# Patient Record
Sex: Female | Born: 1950
Health system: Southern US, Community
[De-identification: ages and names within clinical notes are randomized; demographics above are authoritative.]

## PROBLEM LIST (undated history)

## (undated) DIAGNOSIS — Z8619 Personal history of other infectious and parasitic diseases: Secondary | ICD-10-CM

## (undated) DIAGNOSIS — J45909 Unspecified asthma, uncomplicated: Secondary | ICD-10-CM

## (undated) DIAGNOSIS — Z9109 Other allergy status, other than to drugs and biological substances: Secondary | ICD-10-CM

## (undated) DIAGNOSIS — R0789 Other chest pain: Secondary | ICD-10-CM

## (undated) DIAGNOSIS — M1990 Unspecified osteoarthritis, unspecified site: Secondary | ICD-10-CM

## (undated) DIAGNOSIS — R112 Nausea with vomiting, unspecified: Secondary | ICD-10-CM

## (undated) DIAGNOSIS — E7800 Pure hypercholesterolemia, unspecified: Secondary | ICD-10-CM

## (undated) DIAGNOSIS — Z9889 Other specified postprocedural states: Secondary | ICD-10-CM

## (undated) DIAGNOSIS — N6099 Unspecified benign mammary dysplasia of unspecified breast: Secondary | ICD-10-CM

## (undated) DIAGNOSIS — R609 Edema, unspecified: Secondary | ICD-10-CM

## (undated) DIAGNOSIS — N189 Chronic kidney disease, unspecified: Secondary | ICD-10-CM

## (undated) DIAGNOSIS — K219 Gastro-esophageal reflux disease without esophagitis: Secondary | ICD-10-CM

## (undated) DIAGNOSIS — R0600 Dyspnea, unspecified: Secondary | ICD-10-CM

## (undated) HISTORY — DX: Edema, unspecified: R60.9

## (undated) HISTORY — DX: Unspecified benign mammary dysplasia of unspecified breast: N60.99

## (undated) HISTORY — DX: Unspecified osteoarthritis, unspecified site: M19.90

## (undated) HISTORY — PX: ABDOMINAL HYSTERECTOMY: SHX81

## (undated) HISTORY — PX: FOOT SURGERY: SHX648

## (undated) HISTORY — PX: WISDOM TOOTH EXTRACTION: SHX21

## (undated) HISTORY — DX: Other chest pain: R07.89

## (undated) HISTORY — DX: Pure hypercholesterolemia, unspecified: E78.00

## (undated) HISTORY — PX: DILATION AND CURETTAGE OF UTERUS: SHX78

---

## 1989-12-17 HISTORY — PX: TUBAL LIGATION: SHX77

## 1990-10-17 HISTORY — PX: RIGHT OOPHORECTOMY: SHX2359

## 1999-06-14 ENCOUNTER — Encounter: Payer: Self-pay | Admitting: Gynecology

## 1999-06-14 ENCOUNTER — Ambulatory Visit (HOSPITAL_COMMUNITY): Admission: RE | Admit: 1999-06-14 | Discharge: 1999-06-14 | Payer: Self-pay | Admitting: Gynecology

## 1999-06-19 ENCOUNTER — Ambulatory Visit (HOSPITAL_COMMUNITY): Admission: RE | Admit: 1999-06-19 | Discharge: 1999-06-19 | Payer: Self-pay | Admitting: Gynecology

## 1999-07-13 ENCOUNTER — Other Ambulatory Visit: Admission: RE | Admit: 1999-07-13 | Discharge: 1999-07-13 | Payer: Self-pay | Admitting: Gynecology

## 1999-11-07 ENCOUNTER — Ambulatory Visit (HOSPITAL_COMMUNITY): Admission: RE | Admit: 1999-11-07 | Discharge: 1999-11-07 | Payer: Self-pay | Admitting: *Deleted

## 2000-04-10 ENCOUNTER — Encounter: Payer: Self-pay | Admitting: Gynecology

## 2000-04-10 ENCOUNTER — Encounter: Admission: RE | Admit: 2000-04-10 | Discharge: 2000-04-10 | Payer: Self-pay | Admitting: Gynecology

## 2000-11-04 ENCOUNTER — Other Ambulatory Visit: Admission: RE | Admit: 2000-11-04 | Discharge: 2000-11-04 | Payer: Self-pay | Admitting: *Deleted

## 2001-07-22 ENCOUNTER — Ambulatory Visit (HOSPITAL_COMMUNITY): Admission: RE | Admit: 2001-07-22 | Discharge: 2001-07-22 | Payer: Self-pay | Admitting: *Deleted

## 2001-07-22 ENCOUNTER — Encounter: Payer: Self-pay | Admitting: *Deleted

## 2001-11-04 ENCOUNTER — Other Ambulatory Visit: Admission: RE | Admit: 2001-11-04 | Discharge: 2001-11-04 | Payer: Self-pay | Admitting: *Deleted

## 2001-11-11 ENCOUNTER — Encounter: Admission: RE | Admit: 2001-11-11 | Discharge: 2001-11-11 | Payer: Self-pay | Admitting: *Deleted

## 2001-11-11 ENCOUNTER — Encounter: Payer: Self-pay | Admitting: *Deleted

## 2002-09-16 DIAGNOSIS — N6099 Unspecified benign mammary dysplasia of unspecified breast: Secondary | ICD-10-CM

## 2002-09-16 HISTORY — PX: BREAST BIOPSY: SHX20

## 2002-09-16 HISTORY — PX: BREAST SURGERY: SHX581

## 2002-09-16 HISTORY — DX: Unspecified benign mammary dysplasia of unspecified breast: N60.99

## 2002-09-23 ENCOUNTER — Encounter: Payer: Self-pay | Admitting: *Deleted

## 2002-09-23 ENCOUNTER — Ambulatory Visit (HOSPITAL_COMMUNITY): Admission: RE | Admit: 2002-09-23 | Discharge: 2002-09-23 | Payer: Self-pay | Admitting: *Deleted

## 2002-09-25 ENCOUNTER — Encounter: Admission: RE | Admit: 2002-09-25 | Discharge: 2002-09-25 | Payer: Self-pay | Admitting: *Deleted

## 2002-09-25 ENCOUNTER — Encounter (INDEPENDENT_AMBULATORY_CARE_PROVIDER_SITE_OTHER): Payer: Self-pay | Admitting: Specialist

## 2002-09-25 ENCOUNTER — Encounter: Payer: Self-pay | Admitting: *Deleted

## 2002-11-20 ENCOUNTER — Other Ambulatory Visit: Admission: RE | Admit: 2002-11-20 | Discharge: 2002-11-20 | Payer: Self-pay | Admitting: Obstetrics and Gynecology

## 2003-04-13 ENCOUNTER — Encounter: Payer: Self-pay | Admitting: General Surgery

## 2003-04-13 ENCOUNTER — Encounter: Admission: RE | Admit: 2003-04-13 | Discharge: 2003-04-13 | Payer: Self-pay | Admitting: General Surgery

## 2003-10-11 ENCOUNTER — Encounter: Admission: RE | Admit: 2003-10-11 | Discharge: 2003-10-11 | Payer: Self-pay | Admitting: General Surgery

## 2003-10-11 ENCOUNTER — Encounter: Payer: Self-pay | Admitting: General Surgery

## 2003-12-27 ENCOUNTER — Encounter: Admission: RE | Admit: 2003-12-27 | Discharge: 2003-12-27 | Payer: Self-pay | Admitting: Obstetrics and Gynecology

## 2004-01-20 ENCOUNTER — Other Ambulatory Visit: Admission: RE | Admit: 2004-01-20 | Discharge: 2004-01-20 | Payer: Self-pay | Admitting: Obstetrics and Gynecology

## 2004-10-11 ENCOUNTER — Encounter: Admission: RE | Admit: 2004-10-11 | Discharge: 2004-10-11 | Payer: Self-pay | Admitting: *Deleted

## 2005-02-21 ENCOUNTER — Other Ambulatory Visit: Admission: RE | Admit: 2005-02-21 | Discharge: 2005-02-21 | Payer: Self-pay | Admitting: Obstetrics and Gynecology

## 2005-10-12 ENCOUNTER — Ambulatory Visit (HOSPITAL_COMMUNITY): Admission: RE | Admit: 2005-10-12 | Discharge: 2005-10-12 | Payer: Self-pay | Admitting: General Surgery

## 2005-10-12 ENCOUNTER — Encounter: Admission: RE | Admit: 2005-10-12 | Discharge: 2005-10-12 | Payer: Self-pay | Admitting: General Surgery

## 2005-10-29 ENCOUNTER — Ambulatory Visit (HOSPITAL_COMMUNITY): Admission: RE | Admit: 2005-10-29 | Discharge: 2005-10-29 | Payer: Self-pay | Admitting: General Surgery

## 2005-11-06 ENCOUNTER — Ambulatory Visit (HOSPITAL_COMMUNITY): Admission: RE | Admit: 2005-11-06 | Discharge: 2005-11-06 | Payer: Self-pay | Admitting: General Surgery

## 2006-02-25 ENCOUNTER — Other Ambulatory Visit: Admission: RE | Admit: 2006-02-25 | Discharge: 2006-02-25 | Payer: Self-pay | Admitting: Obstetrics & Gynecology

## 2006-04-19 ENCOUNTER — Ambulatory Visit (HOSPITAL_COMMUNITY): Admission: RE | Admit: 2006-04-19 | Discharge: 2006-04-19 | Payer: Self-pay | Admitting: Gastroenterology

## 2006-10-15 ENCOUNTER — Encounter: Admission: RE | Admit: 2006-10-15 | Discharge: 2006-10-15 | Payer: Self-pay | Admitting: General Surgery

## 2007-03-04 ENCOUNTER — Other Ambulatory Visit: Admission: RE | Admit: 2007-03-04 | Discharge: 2007-03-04 | Payer: Self-pay | Admitting: Obstetrics and Gynecology

## 2007-03-17 ENCOUNTER — Encounter: Admission: RE | Admit: 2007-03-17 | Discharge: 2007-03-17 | Payer: Self-pay | Admitting: Obstetrics and Gynecology

## 2007-07-18 HISTORY — PX: HYSTEROSCOPY WITH D & C: SHX1775

## 2007-08-15 ENCOUNTER — Ambulatory Visit (HOSPITAL_BASED_OUTPATIENT_CLINIC_OR_DEPARTMENT_OTHER): Admission: RE | Admit: 2007-08-15 | Discharge: 2007-08-15 | Payer: Self-pay | Admitting: Obstetrics & Gynecology

## 2007-08-15 ENCOUNTER — Encounter: Payer: Self-pay | Admitting: Obstetrics & Gynecology

## 2007-10-17 ENCOUNTER — Ambulatory Visit (HOSPITAL_COMMUNITY): Admission: RE | Admit: 2007-10-17 | Discharge: 2007-10-17 | Payer: Self-pay | Admitting: Surgery

## 2007-10-17 ENCOUNTER — Encounter: Admission: RE | Admit: 2007-10-17 | Discharge: 2007-10-17 | Payer: Self-pay | Admitting: Surgery

## 2008-03-04 ENCOUNTER — Other Ambulatory Visit: Admission: RE | Admit: 2008-03-04 | Discharge: 2008-03-04 | Payer: Self-pay | Admitting: Obstetrics and Gynecology

## 2008-04-19 ENCOUNTER — Ambulatory Visit (HOSPITAL_COMMUNITY): Admission: RE | Admit: 2008-04-19 | Discharge: 2008-04-19 | Payer: Self-pay | Admitting: Surgery

## 2008-10-19 ENCOUNTER — Encounter: Admission: RE | Admit: 2008-10-19 | Discharge: 2008-10-19 | Payer: Self-pay | Admitting: Obstetrics & Gynecology

## 2008-11-02 ENCOUNTER — Encounter (INDEPENDENT_AMBULATORY_CARE_PROVIDER_SITE_OTHER): Payer: Self-pay | Admitting: Family Medicine

## 2008-11-02 ENCOUNTER — Ambulatory Visit (HOSPITAL_COMMUNITY): Admission: RE | Admit: 2008-11-02 | Discharge: 2008-11-02 | Payer: Self-pay | Admitting: Family Medicine

## 2009-04-05 ENCOUNTER — Other Ambulatory Visit: Admission: RE | Admit: 2009-04-05 | Discharge: 2009-04-05 | Payer: Self-pay | Admitting: Gynecology

## 2009-10-24 ENCOUNTER — Encounter: Admission: RE | Admit: 2009-10-24 | Discharge: 2009-10-24 | Payer: Self-pay | Admitting: Surgery

## 2010-06-14 ENCOUNTER — Ambulatory Visit (HOSPITAL_COMMUNITY): Admission: RE | Admit: 2010-06-14 | Discharge: 2010-06-14 | Payer: Self-pay | Admitting: Orthopedic Surgery

## 2010-10-17 LAB — HM DEXA SCAN

## 2010-10-25 ENCOUNTER — Ambulatory Visit (HOSPITAL_COMMUNITY): Admission: RE | Admit: 2010-10-25 | Discharge: 2010-10-25 | Payer: Self-pay | Admitting: Surgery

## 2010-10-25 ENCOUNTER — Encounter: Admission: RE | Admit: 2010-10-25 | Discharge: 2010-10-25 | Payer: Self-pay | Admitting: Surgery

## 2010-10-26 ENCOUNTER — Ambulatory Visit: Payer: Self-pay | Admitting: Cardiology

## 2010-10-27 ENCOUNTER — Encounter: Admission: RE | Admit: 2010-10-27 | Discharge: 2010-10-27 | Payer: Self-pay | Admitting: Obstetrics & Gynecology

## 2011-05-01 NOTE — Op Note (Signed)
NAMEMINTA, FAIR NO.:  [id number]   MEDICAL RECORD NO.:  [id number]          PATIENT TYPE:  AMB   LOCATION:  [facility]                         FACILITY:  [facility]   PHYSICIAN:  M. Leda Quail, MD  DATE OF BIRTH:  1951/05/12   DATE OF PROCEDURE:  08/15/2007  DATE OF DISCHARGE:                               OPERATIVE REPORT   PREOPERATIVE DIAGNOSES:  1. [age]-year-old gravida 4, para 2, married white female with      postmenopausal bleed.  2. Endometrial polyp.   POSTOPERATIVE DIAGNOSES:  1. [age]-year-old gravida 4, para 2, married white female with      postmenopausal bleed.  2. Endometrial polyp.   PROCEDURE:  1. Hysteroscopy with polyp resection.  2. Dilatation and curettage.   SURGEON:  M. Leda Quail, MD   ASSISTANT:  OR staff.   ANESTHESIA:  MAC.  Dr. Rica Mast oversaw the case and CRNA for the  procedure was Mr. Andrey Campanile.   FINDINGS:  Endometrial polyp.   SPECIMENS:  Polyp and curettings to Pathology.   ESTIMATED BLOOD LOSS:  Minimal.   FLUIDS:  900 mL of LR.   URINE OUTPUT:  100 mL of clear urine drained with a red rubber Foley  catheter at the beginning of the procedure.   COMPLICATIONS:  None.   INDICATIONS:  Ms. Sweigert is a very nice 60 year old G4, P2 married  white female who called in the end of July with the complaint of some  postmenopausal bleeding; she notices this only when wiping.  We decided  to go ahead and proceed with a sonohysterogram, which showed some small  intramural fibroids, but at least 1, possibly 2 endometrial polyps.  We  talked about removing this surgically and patient was in agreement with  this.  She is here for this today.   PROCEDURE:  The patient was taken to the operating room, consent present  on the chart and running IVs in place.  The patient was placed in supine  position.  Anesthesia was administered by the anesthesia staff without  difficulty.  Legs were positioned in the low lithotomy  position in [place]  stirrups.  Care was taken to make sure that there was no extra pressure  on the outside of the knees or the ankles.  Legs were then positioned in  the high lithotomy position.  Perineum, inner thighs and vagina were  prepped in the normal sterile fashion.  A red rubber Foley catheter was  used to drain the bladder of all urine.  The patient was then draped in  the normal sterile fashion.   A bivalve speculum was placed in the vagina.  The anterior lip of the  cervix was grasped with a single-tooth tenaculum.  Ten milliliters of 1%  lidocaine without epinephrine were instilled in the cervix at the 3, 6,  9 and 12 o'clock positions.  The uterus was then sounded to about 8 cm;  this was consistent with the length of the uterus on ultrasound.  Using  [facility] dilators, the cervix was dilated up to a #29.   An operative hysteroscope was  then passed through the cervical os.  Hysteroscopic media of LR was used.  The endometrial cavity expanded  easily.  The tubal ostia were noted bilaterally and photodocumentation  was made.  In the right fundal region of the endometrial cavity, there  was 1 very clear polyp and a possible polyp versus fluffy area of  endometrial tissue.  The hysteroscope was removed.  A polyp forceps was  placed in the cervix and the polyp was grasped and twisted at the base.  Then using a toothed curette, the endometrium was curetted till a gritty  texture was noted in all quadrants.  Then the hysteroscope was placed  back through the cervical os and the hysteroscopic media was used again  to expand the endometrial cavity.  There was a small area at the base of  the polyp that was not completely removed.  Then using the resectoscope  portion of the hysteroscope, this base was excised with 1 pass of the  hysteroscope.  At this point, all instruments were removed from the  cervix.  The tenaculum was removed from the anterior lip of the cervix.  There was a  small amount of bleeding that was noted at the tenaculum  sites and this was made hemostatic with silver nitrate sticks.  The  endometrial curettings and polyp were put together to be sent to  Pathology.  The bivalve speculum was removed from the vagina.  The  patient was positioned back in the supine position and her legs were  removed from the [place] stirrups.  The Betadine was cleansed from the  skin.  She was awakened from anesthesia without difficulty.  Sponge, lap  and instrument counts were correct x2.  There were no needles used on  the field.      Lum Keas, MD  Electronically Signed     MSM/MEDQ  D:  08/15/2007  T:  08/16/2007  Job:  [id number]

## 2011-05-04 NOTE — Op Note (Signed)
NAMESHARYL, PANCHAL NO.:  [id number]   MEDICAL RECORD NO.:  [id number]          PATIENT TYPE:  AMB   LOCATION:  ENDO                         FACILITY:  [facility]   PHYSICIAN:  Bernette Redbird, M.D.   DATE OF BIRTH:  [birth year]   DATE OF PROCEDURE:  04/19/2006  DATE OF DISCHARGE:                                 OPERATIVE REPORT   PROCEDURE:  Colonoscopy.   INDICATIONS:  Screening for colon cancer in a 60 year old female with a  family history of colon polyps in her father (unknown histology).   FINDINGS:  Normal exam to the terminal ileum.   PROCEDURE:  The nature, purpose, and risks of the procedure had been  reviewed with the patient through our open access program.  I also reviewed  the risks and purpose of the procedure with the patient at the bedside.  Sedation was fentanyl 50 mcg, Versed 4 mg and Phenergan 12.5 mg IV without  arrhythmias or desaturation.  The Olympus adjustable tension pediatric video  colonoscope was advanced to the terminal ileum without significant  difficulty, using just a little bit of external abdominal compression to  control looping.  The terminal ileum was entered for a very short distance.  The appendiceal orifice was also clearly identified.  Pullback was then  performed.  The quality of prep was very good and with a little bit of  irrigation, it is felt that all areas were well seen.   This was a normal examination.  No polyps, cancer, colitis, vascular  malformations or diverticulosis were noted and retroflexion of the rectum  was normal.  No biopsies were obtained.  The patient tolerated the procedure  well and there no apparent complications.   IMPRESSION:  Normal screening colonoscopy in a patient with a family history  of colon polyps of indeterminate histology (V76.51).   PLAN:  Repeat colonoscopy in five years after which subsequent exams might  be spaced out to longer intervals since the family history is  uncertain.           ______________________________  Bernette Redbird, M.D.     RB/MEDQ  D:  04/19/2006  T:  04/19/2006  Job:  [id number]   cc:   Caryn Bee L. Little, M.D.  Fax: [phone]   Edwena Felty. Romine, M.D.  Fax: [phone]

## 2011-05-15 ENCOUNTER — Other Ambulatory Visit: Payer: Self-pay | Admitting: Gastroenterology

## 2011-05-15 LAB — HM COLONOSCOPY: HM COLON: NORMAL

## 2011-09-28 LAB — CBC
HCT: 39.2
Hemoglobin: 13.7
MCHC: 35
MCV: 88
Platelets: 361
RBC: 4.46
RDW: 12.7
WBC: 6.3

## 2011-09-28 LAB — URINALYSIS, ROUTINE W REFLEX MICROSCOPIC
Bilirubin Urine: NEGATIVE
Glucose, UA: NEGATIVE
Hgb urine dipstick: NEGATIVE
Ketones, ur: NEGATIVE
Nitrite: NEGATIVE
Protein, ur: NEGATIVE
Specific Gravity, Urine: 1.02
Urobilinogen, UA: 0.2
pH: 6

## 2011-09-28 LAB — PREGNANCY, URINE: Preg Test, Ur: NEGATIVE

## 2011-10-16 ENCOUNTER — Other Ambulatory Visit: Payer: Self-pay | Admitting: Obstetrics & Gynecology

## 2011-10-16 DIAGNOSIS — Z1231 Encounter for screening mammogram for malignant neoplasm of breast: Secondary | ICD-10-CM

## 2011-10-22 ENCOUNTER — Other Ambulatory Visit: Payer: Self-pay | Admitting: Cardiology

## 2011-10-22 MED ORDER — ROSUVASTATIN CALCIUM 5 MG PO TABS: 5.0000 mg | ORAL_TABLET | Freq: Every day | ORAL | Status: DC

## 2011-11-05 ENCOUNTER — Ambulatory Visit (INDEPENDENT_AMBULATORY_CARE_PROVIDER_SITE_OTHER): Payer: 59 | Admitting: Cardiology

## 2011-11-05 ENCOUNTER — Encounter: Payer: Self-pay | Admitting: Cardiology

## 2011-11-05 VITALS — BP 134/88 | HR 74 | Ht 63.0 in | Wt 170.0 lb

## 2011-11-05 DIAGNOSIS — E7800 Pure hypercholesterolemia, unspecified: Secondary | ICD-10-CM

## 2011-11-05 LAB — HEPATIC FUNCTION PANEL
ALT: 31 U/L (ref 0–35)
AST: 21 U/L (ref 0–37)
Albumin: 4.3 g/dL (ref 3.5–5.2)
Alkaline Phosphatase: 72 U/L (ref 39–117)
Bilirubin, Direct: 0 mg/dL (ref 0.0–0.3)
Total Bilirubin: 0.7 mg/dL (ref 0.3–1.2)
Total Protein: 7.3 g/dL (ref 6.0–8.3)

## 2011-11-05 LAB — BASIC METABOLIC PANEL
BUN: 16 mg/dL (ref 6–23)
Calcium: 9.8 mg/dL (ref 8.4–10.5)
GFR: 62.06 mL/min (ref >60.00)
Glucose, Bld: 92 mg/dL (ref 70–99)
Potassium: 3.8 mEq/L (ref 3.5–5.1)

## 2011-11-05 MED ORDER — ROSUVASTATIN CALCIUM 5 MG PO TABS: 5.0000 mg | ORAL_TABLET | Freq: Every day | ORAL | Status: DC

## 2011-11-05 NOTE — Patient Instructions (Signed)
We will check fasting blood work today.   Continue your current medication.  I will see you again in 1 year.

## 2011-11-05 NOTE — Progress Notes (Signed)
   Diane Fox Date of Birth: 03-15-1951 Medical Record #[id number]  History of Present Illness: Seen today for yearly followup. She has a history of hypercholesterolemia that we have been managing with statin therapy. She has a history of previous nuclear stress test in November of 2010 which was normal. She is feeling very well followup today. She has no new complaints. She is tolerating her medications well.  Current Outpatient Prescriptions on File Prior to Visit  Medication Sig Dispense Refill  . Calcium Carb-Cholecalciferol (CALCIUM PLUS VITAMIN D3 PO) Take 2 tablets by mouth daily.        Marland Kitchen escitalopram (LEXAPRO) 10 MG tablet Take 10 mg by mouth daily.        . multivitamin (THERAGRAN) per tablet Take 1 tablet by mouth daily.        Marland Kitchen triamterene-hydrochlorothiazide (MAXZIDE) 75-50 MG per tablet Take 0.5 tablets by mouth as needed.        Marland Kitchen DISCONTD: rosuvastatin (CRESTOR) 5 MG tablet Take 1 tablet (5 mg total) by mouth at bedtime.  15 tablet  0    Allergies  Allergen Reactions  . Penicillins   . Sulfa Antibiotics     Past Medical History  Diagnosis Date  . Hypercholesterolemia   . Atypical chest pain   . Arthritis     Past Surgical History  Procedure Date  . Right oophorectomy     History  Smoking status  . Never Smoker   Smokeless tobacco  . Never Used    History  Alcohol Use: Not on file    Family History  Problem Relation Age of Onset  . Heart disease Father   . Heart attack Father     Review of Systems: As noted in history of present illness..  All other systems were reviewed and are negative.  Physical Exam: BP 134/88  Pulse 74  Ht 5\' 3"  (1.6 m)  Wt 170 lb (77.111 kg)  BMI 30.11 kg/m2 The patient is alert and oriented x 3.  The mood and affect are normal.  The skin is warm and dry.  Color is normal.  The HEENT exam reveals that the sclera are nonicteric.  The mucous membranes are moist.  The carotids are 2+ without bruits.  There is no  thyromegaly.  There is no JVD.  The lungs are clear.  The chest wall is non tender.  The heart exam reveals a regular rate with a normal S1 and S2.  There are no murmurs, gallops, or rubs.  The PMI is not displaced.   Abdominal exam reveals good bowel sounds.  There is no guarding or rebound.  There is no hepatosplenomegaly or tenderness.  There are no masses.  Exam of the legs reveal no clubbing, cyanosis, or edema.  The legs are without rashes.  The distal pulses are intact.  Cranial nerves II - XII are intact.  Motor and sensory functions are intact.  The gait is normal.  LABORATORY DATA: ECG today demonstrates normal sinus rhythm with a normal ECG.  Assessment / Plan:

## 2011-11-05 NOTE — Assessment & Plan Note (Signed)
Lipid panel today shows excellent control of her lipid levels. We will continue on her current medical therapy and followup again in one year.

## 2011-11-06 ENCOUNTER — Ambulatory Visit
Admission: RE | Admit: 2011-11-06 | Discharge: 2011-11-06 | Disposition: A | Discharge status: Home / Self Care | Payer: 59 | Source: Ambulatory Visit | Attending: Obstetrics & Gynecology | Admitting: Obstetrics & Gynecology

## 2011-11-06 DIAGNOSIS — Z1231 Encounter for screening mammogram for malignant neoplasm of breast: Secondary | ICD-10-CM

## 2011-11-15 NOTE — Progress Notes (Signed)
Addended by: Andrey Cota A on: 11/15/2011 04:33 PM   Modules accepted: Orders

## 2012-05-27 LAB — HM PAP SMEAR: HM Pap smear: NEGATIVE

## 2012-10-20 ENCOUNTER — Other Ambulatory Visit: Payer: Self-pay | Admitting: Obstetrics & Gynecology

## 2012-10-20 DIAGNOSIS — Z1231 Encounter for screening mammogram for malignant neoplasm of breast: Secondary | ICD-10-CM

## 2012-10-23 ENCOUNTER — Other Ambulatory Visit: Payer: Self-pay | Admitting: Obstetrics & Gynecology

## 2012-10-23 DIAGNOSIS — N6099 Unspecified benign mammary dysplasia of unspecified breast: Secondary | ICD-10-CM

## 2012-11-06 ENCOUNTER — Ambulatory Visit
Admission: RE | Admit: 2012-11-06 | Discharge: 2012-11-06 | Disposition: A | Discharge status: Home / Self Care | Payer: 59 | Source: Ambulatory Visit | Attending: Obstetrics & Gynecology | Admitting: Obstetrics & Gynecology

## 2012-11-06 DIAGNOSIS — Z1231 Encounter for screening mammogram for malignant neoplasm of breast: Secondary | ICD-10-CM

## 2012-11-10 ENCOUNTER — Other Ambulatory Visit (HOSPITAL_COMMUNITY): Payer: 59

## 2012-11-12 ENCOUNTER — Ambulatory Visit (HOSPITAL_COMMUNITY)
Admission: RE | Admit: 2012-11-12 | Discharge: 2012-11-12 | Disposition: A | Discharge status: Home / Self Care | Payer: 59 | Source: Ambulatory Visit | Attending: Obstetrics & Gynecology | Admitting: Obstetrics & Gynecology

## 2012-11-12 DIAGNOSIS — N6099 Unspecified benign mammary dysplasia of unspecified breast: Secondary | ICD-10-CM

## 2012-11-12 DIAGNOSIS — N6089 Other benign mammary dysplasias of unspecified breast: Secondary | ICD-10-CM | POA: Insufficient documentation

## 2012-11-12 MED ORDER — GADOBENATE DIMEGLUMINE 529 MG/ML IV SOLN
15.0000 mL | Freq: Once | INTRAVENOUS | Status: AC | PRN
  Administered 2012-11-12: 15 mL via INTRAVENOUS

## 2012-11-17 LAB — POCT I-STAT, CHEM 8
BUN: 20 mg/dL (ref 6–23)
Chloride: 102 mEq/L (ref 96–112)
Creatinine, Ser: 1.1 mg/dL (ref 0.50–1.10)
Sodium: 140 mEq/L (ref 135–145)
TCO2: 29 mmol/L (ref 0–100)

## 2012-11-19 ENCOUNTER — Other Ambulatory Visit: Payer: Self-pay

## 2012-11-19 MED ORDER — ROSUVASTATIN CALCIUM 5 MG PO TABS: 5.0000 mg | ORAL_TABLET | Freq: Every day | ORAL | Status: DC

## 2013-01-15 ENCOUNTER — Other Ambulatory Visit: Payer: Self-pay

## 2013-01-15 DIAGNOSIS — E7800 Pure hypercholesterolemia, unspecified: Secondary | ICD-10-CM

## 2013-01-23 ENCOUNTER — Ambulatory Visit (INDEPENDENT_AMBULATORY_CARE_PROVIDER_SITE_OTHER): Payer: 59 | Admitting: Emergency Medicine

## 2013-01-23 ENCOUNTER — Encounter: Payer: Self-pay | Admitting: Emergency Medicine

## 2013-01-23 VITALS — BP 122/72 | HR 64 | Temp 97.8°F | Ht 62.5 in | Wt 171.0 lb

## 2013-01-23 DIAGNOSIS — R053 Chronic cough: Secondary | ICD-10-CM | POA: Insufficient documentation

## 2013-01-23 DIAGNOSIS — R059 Cough, unspecified: Secondary | ICD-10-CM

## 2013-01-23 DIAGNOSIS — R05 Cough: Secondary | ICD-10-CM

## 2013-01-23 NOTE — Patient Instructions (Addendum)
Please restart your allegra every day until next visit Please start protonix 40mg  daily until next visit A prescription for Tessalon Perles will be called to your pharmacy Try to undertake voice rest as per our Cyclical Cough Protocol Follow with Dr Delton Coombes in 1 month

## 2013-01-23 NOTE — Progress Notes (Signed)
Subjective:    Patient ID: Diane Fox, female    DOB: Jan 20, 1951, 62 y.o.   MRN: [id number]  HPI 62 yo never smoker, hx allergic rhinitis and possible childhood asthma, hyperlipidemia, HTN. Referred for chronic cough. The cough seemed to start in April '13 after she was treated with Mobic. The cough has been paroxysmal, non-productive, not necessarily related to time of day. Does sometimes wake her from sleeping. Her allergies have been low-level. She was treated with allegra 3 months > no change. Denies GERD sx.  Triggered by laughing. No swallowing trouble, ? Carbonated drinks bother it. She can have some SOB and CP after an episode. Same exertional tolerance.    Review of Systems  Constitutional: Negative for fever and unexpected weight change.  HENT: Negative for ear pain, nosebleeds, congestion, sore throat, rhinorrhea, sneezing, trouble swallowing, dental problem, postnasal drip and sinus pressure.   Eyes: Negative for redness and itching.  Respiratory: Positive for cough and shortness of breath. Negative for chest tightness and wheezing.   Cardiovascular: Negative for palpitations and leg swelling.  Gastrointestinal: Negative for nausea and vomiting.  Genitourinary: Negative for dysuria.  Musculoskeletal: Negative for joint swelling.  Skin: Negative for rash.  Neurological: Negative for headaches.  Hematological: Does not bruise/bleed easily.  Psychiatric/Behavioral: Negative for dysphoric mood. The patient is not nervous/anxious.    Past Medical History  Diagnosis Date  . Hypercholesterolemia   . Atypical chest pain   . Arthritis   . Sinus trouble      Family History  Problem Relation Age of Onset  . Heart disease Father   . Heart attack Father   . Heart disease Mother      History   Social History  . Marital Status: Married    Spouse Name: N/A    Number of Children: 2  . Years of Education: N/A   Occupational History  . IT    Social History  Main Topics  . Smoking status: Never Smoker   . Smokeless tobacco: Never Used  . Alcohol Use: Yes     Comment: 1 glass of wine per week  . Drug Use: No  . Sexually Active: Not on file   Other Topics Concern  . Not on file   Social History Narrative  . No narrative on file     Allergies  Allergen Reactions  . Penicillins   . Sulfa Antibiotics      Outpatient Prescriptions Prior to Visit  Medication Sig Dispense Refill  . Calcium Carb-Cholecalciferol (CALCIUM PLUS VITAMIN D3 PO) Take 2 tablets by mouth daily.        Marland Kitchen escitalopram (LEXAPRO) 10 MG tablet Take 10 mg by mouth daily.        . multivitamin (THERAGRAN) per tablet Take 1 tablet by mouth daily.        . rosuvastatin (CRESTOR) 5 MG tablet Take 1 tablet (5 mg total) by mouth at bedtime.  90 tablet  0  . triamterene-hydrochlorothiazide (MAXZIDE) 75-50 MG per tablet Take 0.5 tablets by mouth as needed.        . [DISCONTINUED] Nutritional Supplements (ESTROVEN PO) Take 1 tablet by mouth daily.         Last reviewed on 01/23/2013  9:45 AM by Lazarus Salines, RN       Objective:   Physical Exam Filed Vitals:   01/23/13 0946  BP: 122/72  Pulse: 64  Temp: 97.8 F (36.6 C)   Gen: Pleasant, well-nourished, in no distress,  normal affect  ENT: No lesions,  mouth clear,  oropharynx clear, no postnasal drip  Neck: No JVD, no TMG, no carotid bruits  Lungs: No use of accessory muscles, no dullness to percussion, clear without rales or rhonchi  Cardiovascular: RRR, heart sounds normal, no murmur or gallops, no peripheral edema  Musculoskeletal: No deformities, no cyanosis or clubbing  Neuro: alert, non focal  Skin: Warm, no lesions or rashes      Assessment & Plan:

## 2013-01-23 NOTE — Assessment & Plan Note (Signed)
Etiology unclear. She has been treated for allergies alone without effect. She doesn't have heartburn, but we discussed the possible benefits of doing GERD therapy empirically.   Please restart your allegra every day until next visit Please start protonix 40mg  daily until next visit A prescription for Tessalon Perles will be called to your pharmacy Try to undertake voice rest as per our Cyclical Cough Protocol Follow with Dr Delton Coombes in 1 month

## 2013-01-26 ENCOUNTER — Telehealth: Payer: Self-pay | Admitting: Emergency Medicine

## 2013-01-26 NOTE — Telephone Encounter (Signed)
As RB is listed off this pm, will route to doc of the day.  Dr. Craige Cotta, pls advise if you can clarify this so rx can be sent to pharm.  Thank you.

## 2013-01-26 NOTE — Telephone Encounter (Signed)
Will defer decision to Dr. Delton Coombes.  Please have him address this in AM 01/27/13.

## 2013-01-26 NOTE — Telephone Encounter (Signed)
protonix nor tessalon pearls were called in . Please advise which dosage of benzonatate and directions you would like called in dr. Delton Coombes thanks  Allergies  Allergen Reactions  . Biaxin (Clarithromycin)   . Penicillins   . Sulfa Antibiotics   . Zostavax (Zoster Vaccine Live)

## 2013-01-26 NOTE — Telephone Encounter (Signed)
Per Mindy, Dr. Marthe Patch we wait until RB is back in hospital tomorrow to advise further.  Pt is ok with waiting until tomorrow.  Will route to RB to address and to triage as a reminder to page RB in am.

## 2013-01-27 MED ORDER — PANTOPRAZOLE SODIUM 40 MG PO TBEC: 40.0000 mg | DELAYED_RELEASE_TABLET | Freq: Every day | ORAL | Status: DC

## 2013-01-27 MED ORDER — BENZONATATE 200 MG PO CAPS: 200.0000 mg | ORAL_CAPSULE | ORAL | Status: DC | PRN

## 2013-01-27 NOTE — Telephone Encounter (Addendum)
RB called. Call in Tessalon 200 mg 1 4hrs prn x 1 refill.

## 2013-01-27 NOTE — Telephone Encounter (Signed)
I spoke with pt and is aware of RB recs. She voiced her understanding rx has been sent. Nothing further was needed.

## 2013-01-27 NOTE — Telephone Encounter (Signed)
RB paged and msg routed to him

## 2013-02-16 ENCOUNTER — Other Ambulatory Visit (INDEPENDENT_AMBULATORY_CARE_PROVIDER_SITE_OTHER): Payer: 59

## 2013-02-16 DIAGNOSIS — E7800 Pure hypercholesterolemia, unspecified: Secondary | ICD-10-CM

## 2013-02-16 LAB — HEPATIC FUNCTION PANEL
Bilirubin, Direct: 0 mg/dL (ref 0.0–0.3)
Total Bilirubin: 0.5 mg/dL (ref 0.3–1.2)

## 2013-02-16 LAB — BASIC METABOLIC PANEL
BUN: 21 mg/dL (ref 6–23)
CO2: 28 mEq/L (ref 19–32)
Chloride: 99 mEq/L (ref 96–112)
Glucose, Bld: 94 mg/dL (ref 70–99)
Potassium: 3.6 mEq/L (ref 3.5–5.1)

## 2013-02-16 LAB — LIPID PANEL
HDL: 54.8 mg/dL (ref >39.00)
LDL Cholesterol: 77 mg/dL (ref 0–99)
Total CHOL/HDL Ratio: 3
VLDL: 24.6 mg/dL (ref 0.0–40.0)

## 2013-02-19 ENCOUNTER — Encounter: Payer: Self-pay | Admitting: Cardiology

## 2013-02-19 ENCOUNTER — Ambulatory Visit (INDEPENDENT_AMBULATORY_CARE_PROVIDER_SITE_OTHER): Payer: 59 | Admitting: Cardiology

## 2013-02-19 VITALS — BP 124/70 | HR 66 | Ht 63.0 in | Wt 168.8 lb

## 2013-02-19 DIAGNOSIS — E7800 Pure hypercholesterolemia, unspecified: Secondary | ICD-10-CM

## 2013-02-19 MED ORDER — ROSUVASTATIN CALCIUM 5 MG PO TABS: 5.0000 mg | ORAL_TABLET | Freq: Every day | ORAL | Status: DC

## 2013-02-19 NOTE — Patient Instructions (Signed)
Continue your current therapy  I will see you in one year   

## 2013-02-19 NOTE — Progress Notes (Signed)
Diane Fox Date of Birth: 01/24/1951 Medical Record #[id number]  History of Present Illness: Seen today for yearly followup. She has a history of hypercholesterolemia and is on Crestor therapy. She has no complaints.  Current Outpatient Prescriptions on File Prior to Visit  Medication Sig Dispense Refill  . benzonatate (TESSALON) 200 MG capsule Take 1 capsule (200 mg total) by mouth every 4 (four) hours as needed for cough.  60 capsule  1  . Calcium Carb-Cholecalciferol (CALCIUM PLUS VITAMIN D3 PO) Take 2 tablets by mouth daily.        Marland Kitchen escitalopram (LEXAPRO) 10 MG tablet Take 10 mg by mouth daily.        . multivitamin (THERAGRAN) per tablet Take 1 tablet by mouth daily.        . Probiotic Product (PROBIOTIC DAILY PO) Take 1 tablet by mouth daily.      Marland Kitchen triamterene-hydrochlorothiazide (MAXZIDE) 75-50 MG per tablet Take 0.5 tablets by mouth as needed.         No current facility-administered medications on file prior to visit.    Allergies  Allergen Reactions  . Biaxin (Clarithromycin)   . Penicillins   . Sulfa Antibiotics   . Zostavax (Zoster Vaccine Live)     Past Medical History  Diagnosis Date  . Hypercholesterolemia   . Atypical chest pain   . Arthritis   . Sinus trouble     Past Surgical History  Procedure Laterality Date  . Right oophorectomy    . Tubal ligation  1991    History  Smoking status  . Never Smoker   Smokeless tobacco  . Never Used    History  Alcohol Use  . Yes    Comment: 1 glass of wine per week    Family History  Problem Relation Age of Onset  . Heart disease Father   . Heart attack Father   . Heart disease Mother     Review of Systems: As noted in history of present illness..  All other systems were reviewed and are negative.  Physical Exam: BP 124/70  Pulse 66  Ht 5\' 3"  (1.6 m)  Wt 168 lb 12.8 oz (76.567 kg)  BMI 29.91 kg/m2 The patient is alert and oriented x 3. The HEENT exam is normal. The carotids are 2+  without bruits.  There is no thyromegaly.  There is no JVD.  The lungs are clear.  The heart exam reveals a regular rate with a normal S1 and S2.  There are no murmurs, gallops, or rubs.  The PMI is not displaced.     Exam of the legs reveal no clubbing, cyanosis, or edema.  The legs are without rashes.  The distal pulses are intact.  Cranial nerves II - XII are intact.  Motor and sensory functions are intact.  The gait is normal.  LABORATORY DATA: ECG today demonstrates normal sinus rhythm with a normal ECG. Rate 66 bpm. Lab Results  Component Value Date   WBC 6.3 08/14/2007   HGB 15.0 11/12/2012   HCT 44.0 11/12/2012   PLT 361 08/14/2007   GLUCOSE 94 02/16/2013   CHOL 156 02/16/2013   TRIG 123.0 02/16/2013   HDL 54.80 02/16/2013   LDLCALC 77 02/16/2013   ALT 30 02/16/2013   AST 23 02/16/2013   NA 138 02/16/2013   K 3.6 02/16/2013   CL 99 02/16/2013   CREATININE 1.1 02/16/2013   BUN 21 02/16/2013   CO2 28 02/16/2013   Assessment / Plan:  1. Hypercholesterolemia. Well controlled on crestor. Continue Rx and follow up in one year.

## 2013-02-20 ENCOUNTER — Ambulatory Visit: Payer: 59 | Admitting: Emergency Medicine

## 2013-06-12 ENCOUNTER — Other Ambulatory Visit: Payer: Self-pay | Admitting: *Deleted

## 2013-06-12 MED ORDER — ESCITALOPRAM OXALATE 10 MG PO TABS: 10.0000 mg | ORAL_TABLET | Freq: Every day | ORAL | Status: DC

## 2013-06-12 NOTE — Telephone Encounter (Signed)
Can I see chart please?

## 2013-06-12 NOTE — Telephone Encounter (Signed)
Faxed refill request received from pharmacy for [facility]  Last filled by MD on 05/27/12, #90 X 3 Last AEX - 05/27/12 Next AEX - 06/23/13 with Shirlyn Goltz Please advise refills.

## 2013-06-22 ENCOUNTER — Encounter: Payer: Self-pay | Admitting: *Deleted

## 2013-06-23 ENCOUNTER — Encounter: Payer: Self-pay | Admitting: Nurse Practitioner

## 2013-06-23 ENCOUNTER — Ambulatory Visit (INDEPENDENT_AMBULATORY_CARE_PROVIDER_SITE_OTHER): Payer: 59 | Admitting: Nurse Practitioner

## 2013-06-23 VITALS — BP 122/60 | HR 64 | Resp 12 | Ht 63.0 in | Wt 153.8 lb

## 2013-06-23 DIAGNOSIS — Z0000 Encounter for general adult medical examination without abnormal findings: Secondary | ICD-10-CM

## 2013-06-23 DIAGNOSIS — Z01419 Encounter for gynecological examination (general) (routine) without abnormal findings: Secondary | ICD-10-CM

## 2013-06-23 LAB — POCT URINALYSIS DIPSTICK
Leukocytes, UA: NEGATIVE
Urobilinogen, UA: NEGATIVE

## 2013-06-23 MED ORDER — ESCITALOPRAM OXALATE 10 MG PO TABS: 10.0000 mg | ORAL_TABLET | Freq: Every day | ORAL | Status: DC

## 2013-06-23 NOTE — Patient Instructions (Signed)

## 2013-06-23 NOTE — Progress Notes (Signed)
62 y.o. [id number] Married Caucasian Fe here for annual exam.  Just returned from another cruise to [place].  No increase in depressive symptoms. No vaginal bleeding since 10/2010.  Patient's last menstrual period was 10/17/2010.          Sexually active: no  The current method of family planning is tubal ligation.    Exercising: yes  walk Smoker:  no  Health Maintenance: Pap:  05/27/2012  Normal with negative HR HPV MMG:  11/12/2012 normal Colonoscopy:  04/2011 1 polyp recheck in 10 years BMD:   10/2010 T Score: -0.5; -1.5  (Will be rechecking in November) TDaP:  11/2006 MMR II 12/2006 Shingles vaccine 2012 Labs: Hgb- 14.5    reports that she has never smoked. She has never used smokeless tobacco. She reports that she drinks about 0.5 ounces of alcohol per week. She reports that she does not use illicit drugs.  Past Medical History  Diagnosis Date  . Hypercholesterolemia   . Atypical chest pain   . Arthritis   . Sinus trouble   . Peri-menopausal   . Edema   . 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 Right oophorectomy Right 11/91    tubal ligation with complications  . Breast bx  09/2002    breast sterootactec biopsy  . Hysteroscopy      polyp removed-benign  . Polypectomy  07/2007    hysteroscopic with removal of polyp Dr. Hyacinth Meeker  . Endometrial biopsy  08/2010    proliferative endo  . Breast surgery  10/03    focal atypia hyperplasia  . Tubal ligation  1991  . Oophorectomy Right 10/1990    Current Outpatient Prescriptions  Medication Sig Dispense Refill  . Calcium Carb-Cholecalciferol (CALCIUM PLUS VITAMIN D3 PO) Take 2 tablets by mouth daily.        . cholecalciferol (VITAMIN D) 400 UNITS TABS Take by mouth daily.      Marland Kitchen escitalopram (LEXAPRO) 10 MG tablet Take 1 tablet (10 mg total) by mouth daily.  90 tablet  3  . fexofenadine (ALLEGRA) 180 MG tablet Take 180 mg by mouth daily.      . multivitamin  (THERAGRAN) per tablet Take 1 tablet by mouth daily.        . Probiotic Product (PROBIOTIC DAILY PO) Take 1 tablet by mouth daily.      . rosuvastatin (CRESTOR) 5 MG tablet Take 1 tablet (5 mg total) by mouth at bedtime.  90 tablet  3  . triamterene-hydrochlorothiazide (MAXZIDE) 75-50 MG per tablet Take 0.5 tablets by mouth as needed.         No current facility-administered medications for this visit.    Family History  Problem Relation Age of Onset  . Heart disease Father   . Heart attack Father   . Hypertension Father   . Heart disease Mother   . Hypertension Mother   . Asthma Brother   . Cancer Maternal Grandmother     lymphoma  . Cancer Maternal Grandfather     prostate cancer    ROS:  Pertinent items are noted in HPI.  Otherwise, a comprehensive ROS was negative.  Exam:   BP 122/60  Pulse 64  Resp 12  Ht 5\' 3"  (1.6 m)  Wt 153 lb 12.8 oz (69.763 kg)  BMI 27.25 kg/m2  LMP 10/17/2010 Height: 5\' 3"  (160 cm)  Ht Readings from Last 3 Encounters:  06/23/13 5\' 3"  (1.6 m)  02/19/13 5\' 3"  (1.6 m)  01/23/13 5' 2.5" (1.588 m)    General appearance: alert, cooperative and appears stated age Head: Normocephalic, without obvious abnormality, atraumatic Neck: no adenopathy, supple, symmetrical, trachea midline and thyroid normal to inspection and palpation Lungs: clear to auscultation bilaterally Breasts: normal appearance, no masses or tenderness Heart: regular rate and rhythm Abdomen: soft, non-tender; no masses,  no organomegaly Extremities: extremities normal, atraumatic, no cyanosis or edema Skin: Skin color, texture, turgor normal. No rashes or lesions Lymph nodes: Cervical, supraclavicular, and axillary nodes normal. No abnormal inguinal nodes palpated Neurologic: Grossly normal   Pelvic: External genitalia:  no lesions              Urethra:  normal appearing urethra with no masses, tenderness or lesions              Bartholin's and Skene's: normal                  Vagina: normal appearing vagina with normal color and discharge, no lesions              Cervix: anteverted              Pap taken: no Bimanual Exam:  Uterus:  normal size, contour, position, consistency, mobility, non-tender              Adnexa: no mass, fullness, tenderness               Rectovaginal: Confirms               Anus:  normal sphincter tone, no lesions  A:  Well Woman with normal exam  Postmenopausal no HRT  hypercholesterolemia  History of breast biopsy with focal atypical hyperplasia 10/03  Situational depression doing well on Lexapro  History of endo biopsy w/ proliferative endo and neg Provera challenge  P:   Pap smear as per guidelines   Mammogram due 11/14  counseled on adequate intake of calcium and vitamin D, diet and exercise,  Kegel's exercises return annually or prn  An After Visit Summary was printed and given to the patient.

## 2013-06-29 ENCOUNTER — Encounter: Payer: Self-pay | Admitting: Nurse Practitioner

## 2013-06-29 NOTE — Progress Notes (Signed)
Encounter reviewed by Dr. Excell Neyland Silva.  

## 2013-06-29 NOTE — Progress Notes (Signed)
Diane Fox,  Please see your HPI for this patient.  You placed a wild card in your comment about her bleeding so I cannot close.  If patient had recent bleeding, set up a pelvic ultrasound and endometrial biopsy.  Thank you,  Conley Simmonds

## 2013-06-29 NOTE — Progress Notes (Signed)
Tiffany had to veriify LMP before I would close this encounter.  It really was 2011.

## 2013-06-30 ENCOUNTER — Encounter: Payer: Self-pay | Admitting: Nurse Practitioner

## 2013-08-28 ENCOUNTER — Other Ambulatory Visit (HOSPITAL_COMMUNITY): Payer: Self-pay | Admitting: Orthopedic Surgery

## 2013-08-28 DIAGNOSIS — M25562 Pain in left knee: Secondary | ICD-10-CM

## 2013-09-07 ENCOUNTER — Ambulatory Visit (HOSPITAL_COMMUNITY)
Admission: RE | Admit: 2013-09-07 | Discharge: 2013-09-07 | Disposition: A | Discharge status: Home / Self Care | Payer: 59 | Source: Ambulatory Visit | Attending: Orthopedic Surgery | Admitting: Orthopedic Surgery

## 2013-09-07 DIAGNOSIS — X58XXXA Exposure to other specified factors, initial encounter: Secondary | ICD-10-CM | POA: Insufficient documentation

## 2013-09-07 DIAGNOSIS — M25469 Effusion, unspecified knee: Secondary | ICD-10-CM | POA: Insufficient documentation

## 2013-09-07 DIAGNOSIS — M6740 Ganglion, unspecified site: Secondary | ICD-10-CM | POA: Insufficient documentation

## 2013-09-07 DIAGNOSIS — M2390 Unspecified internal derangement of unspecified knee: Secondary | ICD-10-CM | POA: Insufficient documentation

## 2013-09-07 DIAGNOSIS — S83006A Unspecified dislocation of unspecified patella, initial encounter: Secondary | ICD-10-CM | POA: Insufficient documentation

## 2013-09-07 DIAGNOSIS — M25562 Pain in left knee: Secondary | ICD-10-CM

## 2013-10-09 ENCOUNTER — Other Ambulatory Visit: Payer: Self-pay

## 2013-10-09 DIAGNOSIS — Z1231 Encounter for screening mammogram for malignant neoplasm of breast: Secondary | ICD-10-CM

## 2013-10-22 ENCOUNTER — Other Ambulatory Visit: Payer: Self-pay

## 2013-10-28 ENCOUNTER — Encounter: Payer: Self-pay | Admitting: Nurse Practitioner

## 2013-11-05 ENCOUNTER — Other Ambulatory Visit: Payer: Self-pay | Admitting: Orthopedic Surgery

## 2013-11-05 NOTE — Progress Notes (Signed)
Preoperative surgical orders have been place into the Epic hospital system for Diane Fox on 11/05/2013, 10:55 PM  by Patrica Duel for surgery on 12/8.  Preop Knee  orders including IV Tylenol and IV Decadron as long as there are no contraindications to the above medications. Avel Peace, PA-C/14

## 2013-11-09 ENCOUNTER — Ambulatory Visit
Admission: RE | Admit: 2013-11-09 | Discharge: 2013-11-09 | Disposition: A | Discharge status: Home / Self Care | Payer: 59 | Source: Ambulatory Visit

## 2013-11-09 DIAGNOSIS — Z1231 Encounter for screening mammogram for malignant neoplasm of breast: Secondary | ICD-10-CM

## 2013-11-10 ENCOUNTER — Encounter (HOSPITAL_COMMUNITY): Payer: Self-pay | Admitting: Pharmacy Technician

## 2013-11-11 ENCOUNTER — Other Ambulatory Visit (HOSPITAL_COMMUNITY): Payer: Self-pay | Admitting: Orthopedic Surgery

## 2013-11-11 NOTE — Patient Instructions (Addendum)
20 Diane Fox  11/11/2013   Your procedure is scheduled on:  11/23/13  MONDAY  Report to [facility] at   1045    AM.  Call this number if you have problems the morning of surgery: [phone]       Remember:   Do not eat food After Midnight. Sunday NIGHT--- MAY HAVE CLEAR LIQUIDS Monday MORNING UNTIL 0745 am, THEN NOTHING BY MOUTH   Take these medicines the morning of surgery with A SIP OF WATER:  NONE   .  Contacts, dentures or partial plates can not be worn to surgery  Leave suitcase in the car. After surgery it may be brought to your room.  For patients admitted to the hospital, checkout time is 11:00 AM day of  discharge.             SPECIAL INSTRUCTIONS- SEE  PREPARING FOR SURGERY INSTRUCTION SHEET-     DO NOT WEAR JEWELRY, LOTIONS, POWDERS, OR PERFUMES.  WOMEN-- DO NOT SHAVE LEGS OR UNDERARMS FOR 12 HOURS BEFORE SHOWERS. MEN MAY SHAVE FACE.  Patients discharged the day of surgery will not be allowed to drive home. IF going home the day of surgery, you must have a driver and someone to stay with you for the first 24 hours  Name and phone number of your driver: ADMISSION                                                                       Please read over the following fact sheets that you were given: MRSA Information, Incentive Spirometry Sheet, Blood Transfusion Sheet  Information                                                                                   Ndrew Creason  PST [phone]  [phone]                 FAILURE TO FOLLOW THESE INSTRUCTIONS MAY RESULT IN  CANCELLATION   OF YOUR SURGERY                                                  Patient Signature _____________________________

## 2013-11-11 NOTE — Progress Notes (Signed)
LOV with EKG 3/14 Dr [place] EPIC,clearance with OV and EKG Dr Clarene Duke 09/29/13 chart

## 2013-11-16 ENCOUNTER — Encounter (HOSPITAL_COMMUNITY): Payer: Self-pay

## 2013-11-16 ENCOUNTER — Encounter (HOSPITAL_COMMUNITY)
Admission: RE | Admit: 2013-11-16 | Discharge: 2013-11-16 | Disposition: A | Discharge status: Home / Self Care | Payer: 59 | Source: Ambulatory Visit | Attending: Orthopedic Surgery | Admitting: Orthopedic Surgery

## 2013-11-16 DIAGNOSIS — Z01812 Encounter for preprocedural laboratory examination: Secondary | ICD-10-CM | POA: Insufficient documentation

## 2013-11-16 DIAGNOSIS — Z01818 Encounter for other preprocedural examination: Secondary | ICD-10-CM | POA: Insufficient documentation

## 2013-11-16 LAB — PROTIME-INR: INR: 0.94 (ref 0.00–1.49)

## 2013-11-16 LAB — SURGICAL PCR SCREEN
MRSA, PCR: NEGATIVE
Staphylococcus aureus: NEGATIVE

## 2013-11-16 LAB — COMPREHENSIVE METABOLIC PANEL
AST: 21 U/L (ref 0–37)
Albumin: 4 g/dL (ref 3.5–5.2)
Alkaline Phosphatase: 79 U/L (ref 39–117)
CO2: 30 mEq/L (ref 19–32)
Calcium: 9.5 mg/dL (ref 8.4–10.5)
Chloride: 102 mEq/L (ref 96–112)
GFR calc non Af Amer: 62 mL/min — ABNORMAL LOW (ref >90)
Potassium: 3.5 mEq/L (ref 3.5–5.1)
Total Bilirubin: 0.4 mg/dL (ref 0.3–1.2)

## 2013-11-16 LAB — URINALYSIS, ROUTINE W REFLEX MICROSCOPIC
Glucose, UA: NEGATIVE mg/dL
Leukocytes, UA: NEGATIVE
Nitrite: NEGATIVE
Protein, ur: NEGATIVE mg/dL
Specific Gravity, Urine: 1.015 (ref 1.005–1.030)
Urobilinogen, UA: 0.2 mg/dL (ref 0.0–1.0)

## 2013-11-16 LAB — CBC
HCT: 43.6 % (ref 36.0–46.0)
MCV: 89.3 fL (ref 78.0–100.0)
Platelets: 349 10*3/uL (ref 150–400)
RBC: 4.88 MIL/uL (ref 3.87–5.11)
RDW: 13.1 % (ref 11.5–15.5)
WBC: 6.5 10*3/uL (ref 4.0–10.5)

## 2013-11-16 LAB — APTT: aPTT: 31 seconds (ref 24–37)

## 2013-11-16 LAB — ABO/RH: ABO/RH(D): A POS

## 2013-11-16 LAB — TYPE AND SCREEN: Antibody Screen: NEGATIVE

## 2013-11-22 ENCOUNTER — Other Ambulatory Visit: Payer: Self-pay | Admitting: Orthopedic Surgery

## 2013-11-22 NOTE — H&P (Signed)
Diane Fox  DOB: 12/25/1950 Married / Language: English / Race: White Female  Date of Admission:  11-23-2013  Chief Complaint:  Left Knee Pain  History of Present Illness The patient is a 62 year old female who comes in for a preoperative History and Physical. The patient is scheduled for a left patellofemoral arhtroplasty to be performed by Dr. Gus Rankin. Aluisio, MD at [facility] on 11/23/2013. The patient is a 62 year old female who presents for follow up of their knee. The patient is being followed for their left knee pain and osteoarthritis. Symptoms reported today include: pain. The patient feels that they are doing poorly and report their pain level to be moderate. Current treatment includes: NSAIDs (Aleve or ibuprofen). The patient had the MRI scan performed to see if she potentially had a meniscal tear causing her problems. Pain remains real significant. It is definitely limiting what she can and cannot do. She has a hard time bending and squatting. She has a hard time going up and down stairs. Pain is anterior and lateral. She does not get swelling. It does feel like it wants to give out on her at times. She cannot do what she desires because of her knee pain. The right hurts also, but to a much lesser degree. She is ready to get the knee fixed. She presents now for the patellofemoral replacement. They have been treated conservatively in the past for the above stated problem and despite conservative measures, they continue to have progressive pain and severe functional limitations and dysfunction. They have failed non-operative management including home exercise, medications, and injections. It is felt that they would benefit from undergoing patellofemoral replacement. Risks and benefits of the procedure have been discussed with the patient and they elect to proceed with surgery. There are no active contraindications to surgery such as ongoing infection or  rapidly progressive neurological disease.     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 Atypical Lobular Hyperplasia   Allergies Sulfa Drugs. Hives. Penicillins. Hives.    Family History Cerebrovascular Accident. First Degree Relatives. mother Congestive Heart Failure. father Heart Disease. father Hypertension. mother and father Cancer. grandmother mothers side and grandfather mothers side    Social History Number of flights of stairs before winded. 1 Pain Contract. no Tobacco / smoke exposure. no Marital status. married Exercise. Exercises daily; does running / walking Illicit drug use. no Living situation. live with spouse Tobacco use. never smoker Drug/Alcohol Rehab (Previously). no Children. 2 Current work status. working full time Drug/Alcohol Rehab (Currently). no Alcohol use. current drinker; drinks beer, wine and hard liquor; only occasionally per week Post-Surgical Plans. Home Advance Directives. Living Will, Healthcare POA    Medication History Crestor (5MG  Tablet, Oral) Active. Lexapro (10MG  Tablet, Oral) Active. Maxzide (75-50MG  Tablet, Oral as needed) Active. Centrum Silver ( Oral) Active. D3-1000 (1000UNIT Tablet, Oral) Active. Probiotic Daily ( Oral) Active.   Past Surgical History Breast Biopsy. right Tubal Ligation. Date: 48. Wisdom Teeth Extraction. Date: 45. Hysteroscopy. Date: 2007. Colonoscopy    Review of Systems General:Present- Night Sweats. Not Present- Chills, Fever, Fatigue, Weight Gain, Weight Loss and Memory Loss. Skin:Not Present- Hives, Itching, Rash, Eczema and Lesions. HEENT:Not Present- Tinnitus, Headache, Double Vision, Visual Loss, Hearing Loss and Dentures. Respiratory:Present- Shortness of breath with exertion. Not  Present- Shortness of breath at rest, Allergies, Coughing up blood and Chronic Cough. Cardiovascular:Not Present- Chest Pain,  Racing/skipping heartbeats, Difficulty Breathing Lying Down, Murmur, Swelling and Palpitations. Gastrointestinal:Not Present- Bloody Stool, Heartburn, Abdominal Pain, Vomiting, Nausea, Constipation, Diarrhea, Difficulty Swallowing, Jaundice and Loss of appetitie. Female Genitourinary:Not Present- Blood in Urine, Urinary frequency, Weak urinary stream, Discharge, Flank Pain, Incontinence, Painful Urination, Urgency, Urinary Retention and Urinating at Night. Musculoskeletal:Present- Joint Pain. Not Present- Muscle Weakness, Muscle Pain, Joint Swelling, Back Pain, Morning Stiffness and Spasms. Neurological:Not Present- Tremor, Dizziness, Blackout spells, Paralysis, Difficulty with balance and Weakness. Psychiatric:Not Present- Insomnia.    Vitals Weight: 160 lb Height: 63 in Weight was reported by patient. Height was reported by patient. Body Surface Area: 1.8 m Body Mass Index: 28.34 kg/m Pulse: 64 (Regular) Resp.: 14 (Unlabored) BP: 128/67 (Sitting, Right Arm, Standard)     Physical Exam The physical exam findings are as follows:   General Mental Status - Alert, cooperative and good historian. General Appearance- pleasant. Not in acute distress. Orientation- Oriented X3. Build & Nutrition- Well nourished and Well developed.   Head and Neck Head- normocephalic, atraumatic . Neck Global Assessment- supple. no bruit auscultated on the right and no bruit auscultated on the left.   Eye Vision- Wears corrective lenses. Pupil- Bilateral- Regular and Round. Motion- Bilateral- EOMI.   Chest and Lung Exam Auscultation: Breath sounds:- clear at anterior chest wall and - clear at posterior chest wall. Adventitious sounds:- No Adventitious sounds.   Cardiovascular Auscultation:Rhythm- Regular rate and rhythm. Heart  Sounds- S1 WNL and S2 WNL. Murmurs & Other Heart Sounds:Auscultation of the heart reveals - No Murmurs.   Abdomen Palpation/Percussion:Tenderness- Abdomen is non-tender to palpation. Rigidity (guarding)- Abdomen is soft. Auscultation:Auscultation of the abdomen reveals - Bowel sounds normal.   Female Genitourinary Not done, not pertinent to present illness  Musculoskeletal On exam she is alert and oriented in no apparent distress. Her left knee shows no effusion. Range 5 to 125. There is marked crepitus on range of motion. She is slightly tender along the lateral retinaculum. No medial tenderness or instability.  The right knee no effusion. Range 0 to 130. Slight crepitus on range of motion. Some tenderness lateral greater than medial with no instability.  RADIOGRAPHS: MRI scan of her left knee. I do not see any meniscal tears. She has horrible patellofemoral arthritis with large spur formation and large subchondral cyst in the patella and trochlea.   Assessment & Plan Primary osteoarthritis of both knees (715.16) Impression: Left and Right  Note: Plan is for a Left Patellofemoral Replacement by Dr. Lequita Halt.  Plan is to go home  PCP - Dr. Catha Gosselin - Patient has been seen preoperatively and felt to be stable for surgery.  The patient does not have any contraindications and will receive TXA (tranexamic acid) prior to surgery.  Signed electronically by Lauraine Rinne, III PA-C

## 2013-11-23 ENCOUNTER — Encounter (HOSPITAL_COMMUNITY)
Admission: RE | Disposition: A | Discharge status: Home Health | Payer: Self-pay | Source: Ambulatory Visit | Attending: Orthopedic Surgery

## 2013-11-23 ENCOUNTER — Encounter (HOSPITAL_COMMUNITY): Payer: Self-pay | Admitting: *Deleted

## 2013-11-23 ENCOUNTER — Inpatient Hospital Stay (HOSPITAL_COMMUNITY): Payer: 59 | Admitting: Anesthesiology

## 2013-11-23 ENCOUNTER — Encounter (HOSPITAL_COMMUNITY): Payer: 59 | Admitting: Anesthesiology

## 2013-11-23 ENCOUNTER — Inpatient Hospital Stay (HOSPITAL_COMMUNITY)
Admission: RE | Admit: 2013-11-23 | Discharge: 2013-11-24 | DRG: 470 | Disposition: A | Discharge status: Home Health | Payer: 59 | Source: Ambulatory Visit | Attending: Orthopedic Surgery | Admitting: Orthopedic Surgery

## 2013-11-23 DIAGNOSIS — Z79899 Other long term (current) drug therapy: Secondary | ICD-10-CM

## 2013-11-23 DIAGNOSIS — E7800 Pure hypercholesterolemia, unspecified: Secondary | ICD-10-CM | POA: Diagnosis present

## 2013-11-23 DIAGNOSIS — Z8249 Family history of ischemic heart disease and other diseases of the circulatory system: Secondary | ICD-10-CM

## 2013-11-23 DIAGNOSIS — J45909 Unspecified asthma, uncomplicated: Secondary | ICD-10-CM | POA: Diagnosis present

## 2013-11-23 DIAGNOSIS — N62 Hypertrophy of breast: Secondary | ICD-10-CM | POA: Diagnosis present

## 2013-11-23 DIAGNOSIS — M1710 Unilateral primary osteoarthritis, unspecified knee: Principal | ICD-10-CM | POA: Diagnosis present

## 2013-11-23 DIAGNOSIS — Z823 Family history of stroke: Secondary | ICD-10-CM

## 2013-11-23 HISTORY — PX: PATELLA-FEMORAL ARTHROPLASTY: SHX5037

## 2013-11-23 SURGERY — ARTHROPLASTY, PATELLOFEMORAL
Anesthesia: Spinal | Site: Knee | Laterality: Left

## 2013-11-23 MED ORDER — SODIUM CHLORIDE 0.9 % IR SOLN
Status: DC | PRN
  Administered 2013-11-23: 1000 mL

## 2013-11-23 MED ORDER — KETAMINE HCL 50 MG/ML IJ SOLN
INTRAMUSCULAR | Status: DC | PRN
  Administered 2013-11-23: 10 mg via INTRAMUSCULAR
  Administered 2013-11-23: 20 mg via INTRAMUSCULAR

## 2013-11-23 MED ORDER — SODIUM CHLORIDE 0.9 % IJ SOLN
INTRAMUSCULAR | Status: DC | PRN
  Administered 2013-11-23: 30 mL

## 2013-11-23 MED ORDER — METOCLOPRAMIDE HCL 10 MG PO TABS: 5.0000 mg | ORAL_TABLET | Freq: Three times a day (TID) | ORAL | Status: DC | PRN

## 2013-11-23 MED ORDER — ACETAMINOPHEN 500 MG PO TABS
1000.0000 mg | ORAL_TABLET | Freq: Once | ORAL | Status: AC
  Administered 2013-11-23: 1000 mg via ORAL
  Filled 2013-11-23: qty 2

## 2013-11-23 MED ORDER — LACTATED RINGERS IV SOLN: INTRAVENOUS | Status: DC

## 2013-11-23 MED ORDER — PROPOFOL 10 MG/ML IV BOLUS
INTRAVENOUS | Status: AC
  Filled 2013-11-23: qty 20

## 2013-11-23 MED ORDER — VANCOMYCIN HCL IN DEXTROSE 1-5 GM/200ML-% IV SOLN
1000.0000 mg | INTRAVENOUS | Status: AC
  Administered 2013-11-23: 1000 mg via INTRAVENOUS

## 2013-11-23 MED ORDER — LACTATED RINGERS IV SOLN
INTRAVENOUS | Status: DC
Start: 2013-11-23 — End: 2013-11-23
  Administered 2013-11-23: 1000 mL via INTRAVENOUS

## 2013-11-23 MED ORDER — VANCOMYCIN HCL IN DEXTROSE 1-5 GM/200ML-% IV SOLN
INTRAVENOUS | Status: AC
  Filled 2013-11-23: qty 200

## 2013-11-23 MED ORDER — ACETAMINOPHEN 650 MG RE SUPP: 650.0000 mg | Freq: Four times a day (QID) | RECTAL | Status: DC | PRN

## 2013-11-23 MED ORDER — PROPOFOL INFUSION 10 MG/ML OPTIME
INTRAVENOUS | Status: DC | PRN
  Administered 2013-11-23: 60 ug/kg/min via INTRAVENOUS

## 2013-11-23 MED ORDER — DEXAMETHASONE 6 MG PO TABS
10.0000 mg | ORAL_TABLET | Freq: Every day | ORAL | Status: AC
  Administered 2013-11-24: 10 mg via ORAL
  Filled 2013-11-23: qty 1

## 2013-11-23 MED ORDER — ESCITALOPRAM OXALATE 10 MG PO TABS
10.0000 mg | ORAL_TABLET | Freq: Every day | ORAL | Status: DC
  Administered 2013-11-23: 10 mg via ORAL
  Filled 2013-11-23 (×2): qty 1

## 2013-11-23 MED ORDER — METHOCARBAMOL 500 MG PO TABS
500.0000 mg | ORAL_TABLET | Freq: Four times a day (QID) | ORAL | Status: DC | PRN
  Administered 2013-11-23 – 2013-11-24 (×3): 500 mg via ORAL
  Filled 2013-11-23 (×3): qty 1

## 2013-11-23 MED ORDER — STERILE WATER FOR IRRIGATION IR SOLN
Status: DC | PRN
  Administered 2013-11-23: 3000 mL

## 2013-11-23 MED ORDER — FENTANYL CITRATE 0.05 MG/ML IJ SOLN
INTRAMUSCULAR | Status: DC | PRN
  Administered 2013-11-23: 50 ug via INTRAVENOUS

## 2013-11-23 MED ORDER — ONDANSETRON HCL 4 MG PO TABS: 4.0000 mg | ORAL_TABLET | Freq: Four times a day (QID) | ORAL | Status: DC | PRN

## 2013-11-23 MED ORDER — SODIUM CHLORIDE 0.9 % IJ SOLN
INTRAMUSCULAR | Status: AC
  Filled 2013-11-23: qty 50

## 2013-11-23 MED ORDER — DOCUSATE SODIUM 100 MG PO CAPS
100.0000 mg | ORAL_CAPSULE | Freq: Two times a day (BID) | ORAL | Status: DC
  Administered 2013-11-23 – 2013-11-24 (×2): 100 mg via ORAL

## 2013-11-23 MED ORDER — BUPIVACAINE LIPOSOME 1.3 % IJ SUSP
20.0000 mL | Freq: Once | INTRAMUSCULAR | Status: DC
  Filled 2013-11-23: qty 20

## 2013-11-23 MED ORDER — DEXAMETHASONE SODIUM PHOSPHATE 10 MG/ML IJ SOLN: 10.0000 mg | Freq: Once | INTRAMUSCULAR | Status: DC

## 2013-11-23 MED ORDER — TRAMADOL HCL 50 MG PO TABS: 50.0000 mg | ORAL_TABLET | Freq: Four times a day (QID) | ORAL | Status: DC | PRN

## 2013-11-23 MED ORDER — POLYETHYLENE GLYCOL 3350 17 G PO PACK: 17.0000 g | PACK | Freq: Every day | ORAL | Status: DC | PRN

## 2013-11-23 MED ORDER — METHOCARBAMOL 100 MG/ML IJ SOLN
500.0000 mg | Freq: Four times a day (QID) | INTRAVENOUS | Status: DC | PRN
  Filled 2013-11-23: qty 5

## 2013-11-23 MED ORDER — PHENOL 1.4 % MT LIQD
1.0000 | OROMUCOSAL | Status: DC | PRN
  Filled 2013-11-23: qty 177

## 2013-11-23 MED ORDER — BUPIVACAINE LIPOSOME 1.3 % IJ SUSP
INTRAMUSCULAR | Status: DC | PRN
  Administered 2013-11-23: 20 mL

## 2013-11-23 MED ORDER — TRANEXAMIC ACID 100 MG/ML IV SOLN
1000.0000 mg | INTRAVENOUS | Status: AC
  Administered 2013-11-23: 1000 mg via INTRAVENOUS
  Filled 2013-11-23: qty 10

## 2013-11-23 MED ORDER — MIDAZOLAM HCL 5 MG/5ML IJ SOLN
INTRAMUSCULAR | Status: DC | PRN
  Administered 2013-11-23: 2 mg via INTRAVENOUS

## 2013-11-23 MED ORDER — MENTHOL 3 MG MT LOZG
1.0000 | LOZENGE | OROMUCOSAL | Status: DC | PRN
  Filled 2013-11-23: qty 9

## 2013-11-23 MED ORDER — BUPIVACAINE HCL 0.25 % IJ SOLN
INTRAMUSCULAR | Status: DC | PRN
  Administered 2013-11-23: 20 mL

## 2013-11-23 MED ORDER — DIPHENHYDRAMINE HCL 12.5 MG/5ML PO ELIX: 12.5000 mg | ORAL_SOLUTION | ORAL | Status: DC | PRN

## 2013-11-23 MED ORDER — BISACODYL 10 MG RE SUPP: 10.0000 mg | Freq: Every day | RECTAL | Status: DC | PRN

## 2013-11-23 MED ORDER — LORATADINE 10 MG PO TABS
10.0000 mg | ORAL_TABLET | Freq: Every day | ORAL | Status: DC
  Filled 2013-11-23 (×2): qty 1

## 2013-11-23 MED ORDER — RIVAROXABAN 10 MG PO TABS
10.0000 mg | ORAL_TABLET | Freq: Every day | ORAL | Status: DC
  Administered 2013-11-24: 10 mg via ORAL
  Filled 2013-11-23 (×2): qty 1

## 2013-11-23 MED ORDER — MEPERIDINE HCL 50 MG/ML IJ SOLN: 6.2500 mg | INTRAMUSCULAR | Status: DC | PRN

## 2013-11-23 MED ORDER — ACETAMINOPHEN 500 MG PO TABS
1000.0000 mg | ORAL_TABLET | Freq: Four times a day (QID) | ORAL | Status: AC
  Administered 2013-11-23 – 2013-11-24 (×4): 1000 mg via ORAL
  Filled 2013-11-23 (×5): qty 2

## 2013-11-23 MED ORDER — BUPIVACAINE IN DEXTROSE 0.75-8.25 % IT SOLN
INTRATHECAL | Status: DC | PRN
  Administered 2013-11-23: 15 mg via INTRATHECAL

## 2013-11-23 MED ORDER — FENTANYL CITRATE 0.05 MG/ML IJ SOLN
INTRAMUSCULAR | Status: AC
  Filled 2013-11-23: qty 2

## 2013-11-23 MED ORDER — TRIAMTERENE-HCTZ 75-50 MG PO TABS
0.5000 | ORAL_TABLET | Freq: Every day | ORAL | Status: DC
  Filled 2013-11-23 (×3): qty 1

## 2013-11-23 MED ORDER — DEXAMETHASONE SODIUM PHOSPHATE 10 MG/ML IJ SOLN
INTRAMUSCULAR | Status: AC
  Filled 2013-11-23: qty 1

## 2013-11-23 MED ORDER — KETOROLAC TROMETHAMINE 15 MG/ML IJ SOLN: 7.5000 mg | Freq: Four times a day (QID) | INTRAMUSCULAR | Status: AC | PRN

## 2013-11-23 MED ORDER — ONDANSETRON HCL 4 MG/2ML IJ SOLN: 4.0000 mg | Freq: Four times a day (QID) | INTRAMUSCULAR | Status: DC | PRN

## 2013-11-23 MED ORDER — FLEET ENEMA 7-19 GM/118ML RE ENEM: 1.0000 | ENEMA | Freq: Once | RECTAL | Status: AC | PRN

## 2013-11-23 MED ORDER — SODIUM CHLORIDE 0.9 % IV SOLN: INTRAVENOUS | Status: DC

## 2013-11-23 MED ORDER — PROMETHAZINE HCL 25 MG/ML IJ SOLN: 6.2500 mg | INTRAMUSCULAR | Status: DC | PRN

## 2013-11-23 MED ORDER — METOCLOPRAMIDE HCL 5 MG/ML IJ SOLN: 5.0000 mg | Freq: Three times a day (TID) | INTRAMUSCULAR | Status: DC | PRN

## 2013-11-23 MED ORDER — OXYCODONE HCL 5 MG PO TABS
5.0000 mg | ORAL_TABLET | ORAL | Status: DC | PRN
  Administered 2013-11-23 (×2): 5 mg via ORAL
  Administered 2013-11-23 – 2013-11-24 (×7): 10 mg via ORAL
  Filled 2013-11-23: qty 2
  Filled 2013-11-23: qty 1
  Filled 2013-11-23 (×2): qty 2
  Filled 2013-11-23: qty 1
  Filled 2013-11-23 (×4): qty 2

## 2013-11-23 MED ORDER — VANCOMYCIN HCL IN DEXTROSE 1-5 GM/200ML-% IV SOLN
1000.0000 mg | Freq: Two times a day (BID) | INTRAVENOUS | Status: AC
  Administered 2013-11-24: 1000 mg via INTRAVENOUS
  Filled 2013-11-23: qty 200

## 2013-11-23 MED ORDER — DEXAMETHASONE SODIUM PHOSPHATE 10 MG/ML IJ SOLN
10.0000 mg | Freq: Every day | INTRAMUSCULAR | Status: AC
  Filled 2013-11-23: qty 1

## 2013-11-23 MED ORDER — LACTATED RINGERS IV SOLN
INTRAVENOUS | Status: DC | PRN
  Administered 2013-11-23 (×2): via INTRAVENOUS

## 2013-11-23 MED ORDER — HYDROMORPHONE HCL PF 1 MG/ML IJ SOLN: 0.2500 mg | INTRAMUSCULAR | Status: DC | PRN

## 2013-11-23 MED ORDER — KCL IN DEXTROSE-NACL 20-5-0.45 MEQ/L-%-% IV SOLN
INTRAVENOUS | Status: DC
  Administered 2013-11-23: 19:00:00 via INTRAVENOUS
  Filled 2013-11-23 (×3): qty 1000

## 2013-11-23 MED ORDER — ACETAMINOPHEN 325 MG PO TABS: 650.0000 mg | ORAL_TABLET | Freq: Four times a day (QID) | ORAL | Status: DC | PRN

## 2013-11-23 MED ORDER — BUPIVACAINE HCL (PF) 0.25 % IJ SOLN
INTRAMUSCULAR | Status: AC
  Filled 2013-11-23: qty 30

## 2013-11-23 MED ORDER — MIDAZOLAM HCL 2 MG/2ML IJ SOLN
INTRAMUSCULAR | Status: AC
  Filled 2013-11-23: qty 2

## 2013-11-23 MED ORDER — MORPHINE SULFATE 2 MG/ML IJ SOLN: 1.0000 mg | INTRAMUSCULAR | Status: DC | PRN

## 2013-11-23 MED ORDER — 0.9 % SODIUM CHLORIDE (POUR BTL) OPTIME
TOPICAL | Status: DC | PRN
  Administered 2013-11-23: 1000 mL

## 2013-11-23 SURGICAL SUPPLY — 51 items
BAG ZIPLOCK 12X15 (MISCELLANEOUS) ×2 IMPLANT
BANDAGE ELASTIC 6 VELCRO ST LF (GAUZE/BANDAGES/DRESSINGS) ×2 IMPLANT
BANDAGE ESMARK 6X9 LF (GAUZE/BANDAGES/DRESSINGS) ×1 IMPLANT
BLADE SAG 18X100X1.27 (BLADE) ×2 IMPLANT
BNDG ESMARK 6X9 LF (GAUZE/BANDAGES/DRESSINGS) ×2
BOWL SMART MIX CTS (DISPOSABLE) IMPLANT
BUR OVAL CARBIDE 4.0 (BURR) ×2 IMPLANT
CEM TROCH PFJ SIGMA SZ1 LT HIP (Cement) ×2 IMPLANT
CEMENT HV SMART SET (Cement) ×2 IMPLANT
CUFF TOURN SGL QUICK 34 (TOURNIQUET CUFF) ×1
CUFF TRNQT CYL 34X4X40X1 (TOURNIQUET CUFF) ×1 IMPLANT
DECANTER SPIKE VIAL GLASS SM (MISCELLANEOUS) ×2 IMPLANT
DRAPE EXTREMITY T 121X128X90 (DRAPE) ×2 IMPLANT
DRAPE POUCH INSTRU U-SHP 10X18 (DRAPES) ×2 IMPLANT
DRAPE U-SHAPE 47X51 STRL (DRAPES) ×2 IMPLANT
DRSG ADAPTIC 3X8 NADH LF (GAUZE/BANDAGES/DRESSINGS) ×2 IMPLANT
DRSG PAD ABDOMINAL 8X10 ST (GAUZE/BANDAGES/DRESSINGS) ×2 IMPLANT
DURAPREP 26ML APPLICATOR (WOUND CARE) ×2 IMPLANT
ELECT REM PT RETURN 9FT ADLT (ELECTROSURGICAL) ×2
ELECTRODE REM PT RTRN 9FT ADLT (ELECTROSURGICAL) ×1 IMPLANT
EVACUATOR 1/8 PVC DRAIN (DRAIN) ×2 IMPLANT
FACESHIELD LNG OPTICON STERILE (SAFETY) ×10 IMPLANT
GLOVE BIO SURGEON STRL SZ7.5 (GLOVE) ×2 IMPLANT
GLOVE BIO SURGEON STRL SZ8 (GLOVE) ×2 IMPLANT
GLOVE BIOGEL PI IND STRL 8 (GLOVE) ×2 IMPLANT
GLOVE BIOGEL PI INDICATOR 8 (GLOVE) ×2
GOWN PREVENTION PLUS LG XLONG (DISPOSABLE) ×2 IMPLANT
GOWN STRL REIN XL XLG (GOWN DISPOSABLE) ×2 IMPLANT
HANDPIECE INTERPULSE COAX TIP (DISPOSABLE) ×1
KIT BASIN OR (CUSTOM PROCEDURE TRAY) ×2 IMPLANT
MANIFOLD NEPTUNE II (INSTRUMENTS) ×2 IMPLANT
NDL SAFETY ECLIPSE 18X1.5 (NEEDLE) ×2 IMPLANT
NEEDLE HYPO 18GX1.5 SHARP (NEEDLE) ×2
NS IRRIG 1000ML POUR BTL (IV SOLUTION) ×2 IMPLANT
PACK TOTAL JOINT (CUSTOM PROCEDURE TRAY) ×2 IMPLANT
PAD ABD 8X10 STRL (GAUZE/BANDAGES/DRESSINGS) ×2 IMPLANT
PADDING CAST COTTON 6X4 STRL (CAST SUPPLIES) ×4 IMPLANT
PATELLA DOME PFC 32MM (Knees) ×2 IMPLANT
POSITIONER SURGICAL ARM (MISCELLANEOUS) ×2 IMPLANT
SET HNDPC FAN SPRY TIP SCT (DISPOSABLE) ×1 IMPLANT
SPONGE GAUZE 4X4 12PLY (GAUZE/BANDAGES/DRESSINGS) ×2 IMPLANT
STRIP CLOSURE SKIN 1/2X4 (GAUZE/BANDAGES/DRESSINGS) ×2 IMPLANT
SUCTION FRAZIER 12FR DISP (SUCTIONS) ×2 IMPLANT
SUT MNCRL AB 4-0 PS2 18 (SUTURE) ×2 IMPLANT
SUT VIC AB 2-0 CT1 27 (SUTURE) ×3
SUT VIC AB 2-0 CT1 TAPERPNT 27 (SUTURE) ×3 IMPLANT
SUT VLOC 180 0 24IN GS25 (SUTURE) ×2 IMPLANT
SYR 20CC LL (SYRINGE) ×2 IMPLANT
SYR 50ML LL SCALE MARK (SYRINGE) ×2 IMPLANT
TOWEL OR 17X26 10 PK STRL BLUE (TOWEL DISPOSABLE) ×2 IMPLANT
TRAY FOLEY CATH 14FRSI W/METER (CATHETERS) ×2 IMPLANT

## 2013-11-23 NOTE — Interval H&P Note (Signed)
History and Physical Interval Note:  11/23/2013 1:11 PM  Diane Fox  has presented today for surgery, with the diagnosis of LEFT KNEE PATELLA FEMORAL OSTEOARTHRITIS   The various methods of treatment have been discussed with the patient and family. After consideration of risks, benefits and other options for treatment, the patient has consented to  Procedure(s): LEFT KNEE PATELLA-FEMORAL ARTHROPLASTY (Left) as a surgical intervention .  The patient's history has been reviewed, patient examined, no change in status, stable for surgery.  I have reviewed the patient's chart and labs.  Questions were answered to the patient's satisfaction.     Loanne Drilling

## 2013-11-23 NOTE — Brief Op Note (Signed)
11/23/2013  3:15 PM  PATIENT:  Diane Fox  62 y.o. female  PRE-OPERATIVE DIAGNOSIS:  LEFT KNEE PATELLA FEMORAL OSTEOARTHRITIS   POST-OPERATIVE DIAGNOSIS:  LEFT KNEE PATELLA FEMORAL OSTEOARTHRITIS   PROCEDURE:  Procedure(s): LEFT KNEE PATELLA-FEMORAL ARTHROPLASTY (Left)  SURGEON:  Surgeon(s) and Role:    * Loanne Drilling, MD - Primary  PHYSICIAN ASSISTANT:   ASSISTANTS: Avel Peace, PA-C   ANESTHESIA:   spinal  EBL:  Total I/O In: 1000 [I.V.:1000] Out: 250 [Urine:250]  BLOOD ADMINISTERED:none  DRAINS: (Medium) Hemovact drain(s) in the left knee with  Suction Open   LOCAL MEDICATIONS USED:  OTHER Exparel  COUNTS:  YES  TOURNIQUET:   Total Tourniquet Time Documented: Thigh (Left) - 33 minutes Total: Thigh (Left) - 33 minutes   DICTATION: .Other Dictation: Dictation Number [id number]  PLAN OF CARE: Admit to inpatient   PATIENT DISPOSITION:  PACU - hemodynamically stable.

## 2013-11-23 NOTE — H&P (View-Only) (Signed)
Diane Fox  DOB: 07/29/1951 Married / Language: English / Race: White Female  Date of Admission:  11-23-2013  Chief Complaint:  Left Knee Pain  History of Present Illness The patient is a 61 year old female who comes in for a preoperative History and Physical. The patient is scheduled for a left patellofemoral arhtroplasty to be performed by Dr. Frank V. Aluisio, MD at [facility] on 11/23/2013. The patient is a 61 year old female who presents for follow up of their knee. The patient is being followed for their left knee pain and osteoarthritis. Symptoms reported today include: pain. The patient feels that they are doing poorly and report their pain level to be moderate. Current treatment includes: NSAIDs (Aleve or ibuprofen). The patient had the MRI scan performed to see if she potentially had a meniscal tear causing her problems. Pain remains real significant. It is definitely limiting what she can and cannot do. She has a hard time bending and squatting. She has a hard time going up and down stairs. Pain is anterior and lateral. She does not get swelling. It does feel like it wants to give out on her at times. She cannot do what she desires because of her knee pain. The right hurts also, but to a much lesser degree. She is ready to get the knee fixed. She presents now for the patellofemoral replacement. They have been treated conservatively in the past for the above stated problem and despite conservative measures, they continue to have progressive pain and severe functional limitations and dysfunction. They have failed non-operative management including home exercise, medications, and injections. It is felt that they would benefit from undergoing patellofemoral replacement. Risks and benefits of the procedure have been discussed with the patient and they elect to proceed with surgery. There are no active contraindications to surgery such as ongoing infection or  rapidly progressive neurological disease.     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 Atypical Lobular Hyperplasia   Allergies Sulfa Drugs. Hives. Penicillins. Hives.    Family History Cerebrovascular Accident. First Degree Relatives. mother Congestive Heart Failure. father Heart Disease. father Hypertension. mother and father Cancer. grandmother mothers side and grandfather mothers side    Social History Number of flights of stairs before winded. 1 Pain Contract. no Tobacco / smoke exposure. no Marital status. married Exercise. Exercises daily; does running / walking Illicit drug use. no Living situation. live with spouse Tobacco use. never smoker Drug/Alcohol Rehab (Previously). no Children. 2 Current work status. working full time Drug/Alcohol Rehab (Currently). no Alcohol use. current drinker; drinks beer, wine and hard liquor; only occasionally per week Post-Surgical Plans. Home Advance Directives. Living Will, Healthcare POA    Medication History Crestor (5MG Tablet, Oral) Active. Lexapro (10MG Tablet, Oral) Active. Maxzide (75-50MG Tablet, Oral as needed) Active. Centrum Silver ( Oral) Active. D3-1000 (1000UNIT Tablet, Oral) Active. Probiotic Daily ( Oral) Active.   Past Surgical History Breast Biopsy. right Tubal Ligation. Date: 1990. Wisdom Teeth Extraction. Date: 1976. Hysteroscopy. Date: 2007. Colonoscopy    Review of Systems General:Present- Night Sweats. Not Present- Chills, Fever, Fatigue, Weight Gain, Weight Loss and Memory Loss. Skin:Not Present- Hives, Itching, Rash, Eczema and Lesions. HEENT:Not Present- Tinnitus, Headache, Double Vision, Visual Loss, Hearing Loss and Dentures. Respiratory:Present- Shortness of breath with exertion. Not  Present- Shortness of breath at rest, Allergies, Coughing up blood and Chronic Cough. Cardiovascular:Not Present- Chest Pain,   Racing/skipping heartbeats, Difficulty Breathing Lying Down, Murmur, Swelling and Palpitations. Gastrointestinal:Not Present- Bloody Stool, Heartburn, Abdominal Pain, Vomiting, Nausea, Constipation, Diarrhea, Difficulty Swallowing, Jaundice and Loss of appetitie. Female Genitourinary:Not Present- Blood in Urine, Urinary frequency, Weak urinary stream, Discharge, Flank Pain, Incontinence, Painful Urination, Urgency, Urinary Retention and Urinating at Night. Musculoskeletal:Present- Joint Pain. Not Present- Muscle Weakness, Muscle Pain, Joint Swelling, Back Pain, Morning Stiffness and Spasms. Neurological:Not Present- Tremor, Dizziness, Blackout spells, Paralysis, Difficulty with balance and Weakness. Psychiatric:Not Present- Insomnia.    Vitals Weight: 160 lb Height: 63 in Weight was reported by patient. Height was reported by patient. Body Surface Area: 1.8 m Body Mass Index: 28.34 kg/m Pulse: 64 (Regular) Resp.: 14 (Unlabored) BP: 128/67 (Sitting, Right Arm, Standard)     Physical Exam The physical exam findings are as follows:   General Mental Status - Alert, cooperative and good historian. General Appearance- pleasant. Not in acute distress. Orientation- Oriented X3. Build & Nutrition- Well nourished and Well developed.   Head and Neck Head- normocephalic, atraumatic . Neck Global Assessment- supple. no bruit auscultated on the right and no bruit auscultated on the left.   Eye Vision- Wears corrective lenses. Pupil- Bilateral- Regular and Round. Motion- Bilateral- EOMI.   Chest and Lung Exam Auscultation: Breath sounds:- clear at anterior chest wall and - clear at posterior chest wall. Adventitious sounds:- No Adventitious sounds.   Cardiovascular Auscultation:Rhythm- Regular rate and rhythm. Heart  Sounds- S1 WNL and S2 WNL. Murmurs & Other Heart Sounds:Auscultation of the heart reveals - No Murmurs.   Abdomen Palpation/Percussion:Tenderness- Abdomen is non-tender to palpation. Rigidity (guarding)- Abdomen is soft. Auscultation:Auscultation of the abdomen reveals - Bowel sounds normal.   Female Genitourinary Not done, not pertinent to present illness  Musculoskeletal On exam she is alert and oriented in no apparent distress. Her left knee shows no effusion. Range 5 to 125. There is marked crepitus on range of motion. She is slightly tender along the lateral retinaculum. No medial tenderness or instability.  The right knee no effusion. Range 0 to 130. Slight crepitus on range of motion. Some tenderness lateral greater than medial with no instability.  RADIOGRAPHS: MRI scan of her left knee. I do not see any meniscal tears. She has horrible patellofemoral arthritis with large spur formation and large subchondral cyst in the patella and trochlea.   Assessment & Plan Primary osteoarthritis of both knees (715.16) Impression: Left and Right  Note: Plan is for a Left Patellofemoral Replacement by Dr. Aluisio.  Plan is to go home  PCP - Dr. Kevin Little - Patient has been seen preoperatively and felt to be stable for surgery.  The patient does not have any contraindications and will receive TXA (tranexamic acid) prior to surgery.  Signed electronically by Kelsy Polack L Khelani Kops, III PA-C  

## 2013-11-23 NOTE — Anesthesia Postprocedure Evaluation (Signed)
  Anesthesia Post-op Note  Patient: Diane Fox  Procedure(s) Performed: Procedure(s) (LRB): LEFT KNEE PATELLA-FEMORAL ARTHROPLASTY (Left)  Patient Location: PACU  Anesthesia Type: Spinal  Level of Consciousness: awake and alert   Airway and Oxygen Therapy: Patient Spontanous Breathing  Post-op Pain: mild  Post-op Assessment: Post-op Vital signs reviewed, Patient's Cardiovascular Status Stable, Respiratory Function Stable, Patent Airway and No signs of Nausea or vomiting  Last Vitals:  Filed Vitals:   11/23/13 1836  BP: 149/81  Pulse: 66  Temp: 36.8 C  Resp: 14    Post-op Vital Signs: stable   Complications: No apparent anesthesia complications

## 2013-11-23 NOTE — Anesthesia Procedure Notes (Signed)
Spinal  Patient location during procedure: OR Start time: 11/23/2013 1:57 PM End time: 11/23/2013 2:00 PM Staffing CRNA/Resident: Carmelia Roller R Performed by: resident/CRNA  Preanesthetic Checklist Completed: patient identified, site marked, surgical consent, pre-op evaluation, timeout performed, IV checked, risks and benefits discussed and monitors and equipment checked Spinal Block Patient position: sitting Prep: Betadine Patient monitoring: heart rate, continuous pulse ox and blood pressure Approach: midline Location: L2-3 Injection technique: single-shot Needle Needle type: Sprotte  Needle gauge: 24 G Needle length: 9 cm Needle insertion depth: 7 cm

## 2013-11-23 NOTE — Anesthesia Preprocedure Evaluation (Signed)
Anesthesia Evaluation  Patient identified by MRN, date of birth, ID band Patient awake    Reviewed: Allergy & Precautions, H&P , NPO status , Patient's Chart, lab work & pertinent test results  Airway Mallampati: II TM Distance: >3 FB Neck ROM: Full    Dental no notable dental hx.    Pulmonary neg pulmonary ROS,  breath sounds clear to auscultation  Pulmonary exam normal       Cardiovascular negative cardio ROS  Rhythm:Regular Rate:Normal     Neuro/Psych negative neurological ROS  negative psych ROS   GI/Hepatic negative GI ROS, Neg liver ROS,   Endo/Other  negative endocrine ROS  Renal/GU negative Renal ROS  negative genitourinary   Musculoskeletal negative musculoskeletal ROS (+)   Abdominal   Peds negative pediatric ROS (+)  Hematology negative hematology ROS (+)   Anesthesia Other Findings   Reproductive/Obstetrics negative OB ROS                           Anesthesia Physical Anesthesia Plan  ASA: I  Anesthesia Plan: Spinal   Post-op Pain Management:    Induction:   Airway Management Planned: Simple Face Mask  Additional Equipment:   Intra-op Plan:   Post-operative Plan:   Informed Consent: I have reviewed the patients History and Physical, chart, labs and discussed the procedure including the risks, benefits and alternatives for the proposed anesthesia with the patient or authorized representative who has indicated his/her understanding and acceptance.   Dental advisory given  Plan Discussed with: CRNA  Anesthesia Plan Comments:         Anesthesia Quick Evaluation  

## 2013-11-23 NOTE — Preoperative (Signed)
Beta Blockers   Reason not to administer Beta Blockers:Not Applicable 

## 2013-11-23 NOTE — Transfer of Care (Signed)
Immediate Anesthesia Transfer of Care Note  Patient: Diane Fox  Procedure(s) Performed: Procedure(s): LEFT KNEE PATELLA-FEMORAL ARTHROPLASTY (Left)  Patient Location: PACU  Anesthesia Type:Spinal  Level of Consciousness: sedated  Airway & Oxygen Therapy: Patient Spontanous Breathing and Patient connected to face mask oxygen  Post-op Assessment: Report given to PACU RN and Post -op Vital signs reviewed and stable  Post vital signs: Reviewed and stable  Complications: No apparent anesthesia complications

## 2013-11-24 ENCOUNTER — Encounter (HOSPITAL_COMMUNITY): Payer: Self-pay | Admitting: Orthopedic Surgery

## 2013-11-24 LAB — BASIC METABOLIC PANEL
BUN: 12 mg/dL (ref 6–23)
Calcium: 9.1 mg/dL (ref 8.4–10.5)
Creatinine, Ser: 0.81 mg/dL (ref 0.50–1.10)
GFR calc Af Amer: 88 mL/min — ABNORMAL LOW (ref >90)
GFR calc non Af Amer: 76 mL/min — ABNORMAL LOW (ref >90)
Potassium: 4.1 mEq/L (ref 3.5–5.1)

## 2013-11-24 LAB — CBC
HCT: 39 % (ref 36.0–46.0)
MCH: 30.3 pg (ref 26.0–34.0)
MCHC: 34.4 g/dL (ref 30.0–36.0)
MCV: 88.2 fL (ref 78.0–100.0)
RBC: 4.42 MIL/uL (ref 3.87–5.11)
RDW: 13.2 % (ref 11.5–15.5)

## 2013-11-24 MED ORDER — OXYCODONE HCL 5 MG PO TABS: 5.0000 mg | ORAL_TABLET | ORAL | Status: DC | PRN

## 2013-11-24 MED ORDER — TRAMADOL HCL 50 MG PO TABS: 50.0000 mg | ORAL_TABLET | Freq: Four times a day (QID) | ORAL | Status: DC | PRN

## 2013-11-24 MED ORDER — RIVAROXABAN 10 MG PO TABS: 10.0000 mg | ORAL_TABLET | Freq: Every day | ORAL | Status: DC

## 2013-11-24 MED ORDER — METHOCARBAMOL 500 MG PO TABS: 500.0000 mg | ORAL_TABLET | Freq: Four times a day (QID) | ORAL | Status: DC | PRN

## 2013-11-24 NOTE — Progress Notes (Signed)
Came to visit patient on behalf of [employer] to [employer] program as a benefit of being an employee/dependent with [employer]. Reports she does not have any current Link to Wellness needs and does not need hospital follow up call. Left brochure and contact information if patient should have any needs in future.  Gerre Scull Hall,MSN-Ed, RN,BSN- [facility] [phone]

## 2013-11-24 NOTE — Evaluation (Signed)
Occupational Therapy Evaluation Patient Details Name: Diane Fox MRN: [id number] DOB: 1951/11/12 Today's Date: 11/24/2013 Time: 4696-2952 OT Time Calculation (min): 26 min  OT Assessment / Plan / Recommendation History of present illness left knee patella femoral arthroplasty   Clinical Impression   Pt was admitted for the above sx.  All education was completed.  Pt does not need any further OT at this time.      OT Assessment  Patient does not need any further OT services    Follow Up Recommendations  No OT follow up    Barriers to Discharge      Equipment Recommendations  3 in 1 bedside comode (delivered)    Recommendations for Other Services    Frequency       Precautions / Restrictions Precautions Precautions: Knee Required Braces or Orthoses: Knee Immobilizer - Left Restrictions Weight Bearing Restrictions: No Other Position/Activity Restrictions: WBAT   Pertinent Vitals/Pain LLE sore.  Repositioned with ice   ADL  Grooming: Supervision/safety;Wash/dry hands Where Assessed - Grooming: Supported standing Upper Body Bathing: Set up Where Assessed - Upper Body Bathing: Unsupported sitting Lower Body Bathing: Minimal assistance Where Assessed - Lower Body Bathing: Supported sit to stand Upper Body Dressing: Set up Where Assessed - Upper Body Dressing: Unsupported sitting Lower Body Dressing: Moderate assistance Where Assessed - Lower Body Dressing: Supported sit to stand Toilet Transfer: Charity fundraiser Method: Sit to Barista: Comfort height toilet;Grab bars Toileting - Architect and Hygiene: Min guard Where Assessed - Engineer, mining and Hygiene: Sit to stand from 3-in-1 or toilet Equipment Used: Rolling walker Transfers/Ambulation Related to ADLs: ambulated to bathroom.  Pt got up from chair with min guard and from commode with min A ADL Comments: Pt has a reacher at home.  Pt  does not need this to get pants on.  Educated on use for getting sock off.  Also educated on sock aid, but pt will wear ted hose; husband will assess with these Educated on tub readiness.   OT Diagnosis:    OT Problem List:   OT Treatment Interventions:     OT Goals(Current goals can be found in the care plan section) Acute Rehab OT Goals Patient Stated Goal: i  Visit Information  Last OT Received On: 11/24/13 Assistance Needed: +1 History of Present Illness: left knee patella femoral arthroplasty       Prior Functioning     Home Living Family/patient expects to be discharged to:: Private residence Living Arrangements: Spouse/significant other Type of Home: House Home Access: Stairs to enter [employer] of Steps: 3 Home Layout: Two level;Able to live on main level with bedroom/bathroom Home Equipment:  (has a reacher, hand held shower) Additional Comments: 3:1 was delivered; family took it home Prior Function Level of Independence: Independent Comments: reacher  Communication Communication: No difficulties         Vision/Perception     Cognition  Cognition Arousal/Alertness: Awake/alert Behavior During Therapy: WFL for tasks assessed/performed Overall Cognitive Status: Within Functional Limits for tasks assessed    Extremity/Trunk Assessment Upper Extremity Assessment Upper Extremity Assessment: Overall WFL for tasks assessed      Mobility  Transfers Sit to Stand: 4: Min guard;From toilet;From chair/3-in-1;With upper extremity assist;4: Min assist Stand to Sit: 4: Min guard Details for Transfer Assistance: pt able to self cue.  Pt needed min A to get up from commode due to position of toilet paper with grab bar  Exercise    Balance     End of Session OT - End of Session Activity Tolerance: Patient tolerated treatment well Patient left: in chair;with call bell/phone within reach  GO     Elridge Stemm 11/24/2013, 1:53 PM Marica Otter, OTR/L [phone] 11/24/2013

## 2013-11-24 NOTE — Evaluation (Signed)
Physical Therapy Evaluation Patient Details Name: Diane Fox MRN: [id number] DOB: October 02, 1951 Today's Date: 11/24/2013 Time: 0951-1020 PT Time Calculation (min): 29 min  PT Assessment / Plan / Recommendation History of Present Illness  left knee patella femoral arthroplasty  Clinical Impression  Pt will benefit from PT  To address deficits below    PT Assessment       Follow Up Recommendations  Home health PT    Does the patient have the potential to tolerate intense rehabilitation      Barriers to Discharge        Equipment Recommendations  Rolling walker with 5" wheels    Recommendations for Other Services     Frequency      Precautions / Restrictions Precautions Precautions: Knee Restrictions Weight Bearing Restrictions: No Other Position/Activity Restrictions: WBAT   Pertinent Vitals/Pain Painful gait, was premedicated      Mobility  Bed Mobility Bed Mobility: Supine to Sit Supine to Sit: 4: Min assist Details for Bed Mobility Assistance: min with LLE Transfers Transfers: Sit to Stand;Stand to Sit Sit to Stand: 4: Min guard Stand to Sit: 4: Min guard Details for Transfer Assistance: cues for hand placement and LLE position  Ambulation/Gait Ambulation/Gait Assistance: 4: Min guard Ambulation Distance (Feet): 80 Feet Assistive device: Rolling walker Ambulation/Gait Assistance Details: cues for sequence  Gait Pattern: Step-to pattern;Antalgic    Exercises Total Joint Exercises Ankle Circles/Pumps: AROM;Both;10 reps Quad Sets: AROM;Both;10 reps Heel Slides: AAROM;5 reps;Left   PT Diagnosis:    PT Problem List:   PT Treatment Interventions:       PT Goals(Current goals can be found in the care plan section) Acute Rehab PT Goals Patient Stated Goal: i PT Goal Formulation: With patient Time For Goal Achievement: 12/01/13 Potential to Achieve Goals: Good  Visit Information  Last PT Received On: 11/24/13 Assistance Needed: +1 History of  Present Illness: left knee patella femoral arthroplasty       Prior Functioning  Home Living Family/patient expects to be discharged to:: Private residence Living Arrangements: Spouse/significant other Type of Home: House Home Access: Stairs to enter Secretary/administrator of Steps: 3 Home Layout: Two level;Able to live on main level with bedroom/bathroom Home Equipment: None Prior Function Level of Independence: Independent Comments: reacher  Communication Communication: No difficulties    Cognition  Cognition Arousal/Alertness: Awake/alert Behavior During Therapy: WFL for tasks assessed/performed Overall Cognitive Status: Within Functional Limits for tasks assessed    Extremity/Trunk Assessment Upper Extremity Assessment Upper Extremity Assessment: Overall WFL for tasks assessed Lower Extremity Assessment Lower Extremity Assessment: LLE deficits/detail LLE Deficits / Details: AAROM 0-50 degrees;  ankle WFL   Balance    End of Session PT - End of Session Equipment Utilized During Treatment: Gait belt Activity Tolerance: Patient tolerated treatment well Patient left: in chair CPM Left Knee CPM Left Knee: Off  GP     [facility] 11/24/2013, 10:37 AM

## 2013-11-24 NOTE — Progress Notes (Signed)
   Subjective: 1 Day Post-Op Procedure(s) (LRB): LEFT KNEE PATELLA-FEMORAL ARTHROPLASTY (Left) Patient reports pain as mild.   Patient seen in rounds with Dr. Lequita Halt. Patient is well, and has had no acute complaints or problems Patient is ready to go home later today.  Objective: Vital signs in last 24 hours: Temp:  [97.4 F (36.3 C)-98.3 F (36.8 C)] 97.8 F (36.6 C) (12/09 0539) Pulse Rate:  [58-66] 60 (12/09 0539) Resp:  [14-17] 16 (12/09 0539) BP: (100-150)/(58-81) 132/77 mmHg (12/09 0539) SpO2:  [92 %-100 %] 95 % (12/09 0539) Weight:  [78.926 kg (174 lb)] 78.926 kg (174 lb) (12/08 2014)  Intake/Output from previous day:  Intake/Output Summary (Last 24 hours) at 11/24/13 0843 Last data filed at 11/24/13 0700  Gross per 24 hour  Intake 3301.25 ml  Output   2340 ml  Net 961.25 ml    Intake/Output this shift:    Labs:  Recent Labs  11/24/13 0502  HGB 13.4    Recent Labs  11/24/13 0502  WBC 11.5*  RBC 4.42  HCT 39.0  PLT 313    Recent Labs  11/24/13 0502  NA 139  K 4.1  CL 102  CO2 26  BUN 12  CREATININE 0.81  GLUCOSE 149*  CALCIUM 9.1   No results found for this basename: LABPT, INR,  in the last 72 hours  EXAM: General - Patient is Alert, Appropriate and Oriented Extremity - Neurovascular intact Sensation intact distally Dorsiflexion/Plantar flexion intact Incision - clean, dry, no drainage, healing Motor Function - intact, moving foot and toes well on exam.   Assessment/Plan: 1 Day Post-Op Procedure(s) (LRB): LEFT KNEE PATELLA-FEMORAL ARTHROPLASTY (Left) Procedure(s) (LRB): LEFT KNEE PATELLA-FEMORAL ARTHROPLASTY (Left) Past Medical History  Diagnosis Date  . Hypercholesterolemia   . Atypical chest pain   . Arthritis   . Sinus trouble   . Peri-menopausal     lexapro prn  . Edema     maxide prn  . Atypical lobular hyperplasia of breast 09/2002   Principal Problem:   OA (osteoarthritis) of knee  Estimated body mass index is  30.83 kg/(m^2) as calculated from the following:   Height as of this encounter: 5\' 3"  (1.6 m).   Weight as of this encounter: 78.926 kg (174 lb). Discharge home with home health Diet - Cardiac diet Follow up - in 2 weeks Activity - WBAT Disposition - Home Condition Upon Discharge - Good D/C Meds - See DC Summary DVT Prophylaxis - Xarelto for one week and the ASA daily for two more weeks.  Declan Mier 11/24/2013, 8:43 AM

## 2013-11-24 NOTE — Op Note (Signed)
Diane Fox, Diane Fox NO.:  [id number]  MEDICAL RECORD NO.:  [id number]  LOCATION:  1612                         FACILITY:  [facility]  PHYSICIAN:  Ollen Gross, M.D.    DATE OF BIRTH:  Nov 04, 1951  DATE OF PROCEDURE:  11/23/2013 DATE OF DISCHARGE:                              OPERATIVE REPORT   PREOPERATIVE DIAGNOSIS:  Left knee patellofemoral arthritis.  POSTOPERATIVE DIAGNOSIS:  Left knee patellofemoral arthritis.  PROCEDURE:  Left knee patellofemoral arthroplasty.  SURGEON:  Ollen Gross, MD  ASSISTANT:  Alexzandrew L. Perkins, PA-C  ANESTHESIA:  Spinal.  ESTIMATED BLOOD LOSS:  Minimal.  DRAINS:  Hemovac x1.  TOURNIQUET TIME:  33 minutes at 300 mmHg.  COMPLICATIONS:  None.  CONDITION:  Stable to recovery.  BRIEF CLINICAL NOTE:  Diane Fox is a 62 year old female, who has significant patellofemoral arthritis of the left knee, who has pain refractory to nonoperative management and has had multiple injections and visco supplements.  She has isolated arthritis in her patellofemoral compartment with severe symptoms.  She presents now for patellofemoral arthroplasty.  PROCEDURE IN DETAIL:  After successful administration of spinal anesthetic, a tourniquet was placed high on her left thigh, her left lower extremity was prepped and draped in the usual sterile fashion. Extremities wrapped in Esmarch, knee flexed, and tourniquet inflated to 300 mmHg.  A midline incision was made with a 10 blade through the subcutaneous tissue to the extensor mechanism.  A fresh blade was used to make a mini medial arthrotomy.  We did not cut down to the inter meniscal ligament or medial meniscus.  The soft tissue on the proximal lateral tibia was elevated with attention being paid to avoid the lateral meniscus.  The patella was subsequently everted.  There was eburnated bone throughout the patella.  She has large osteophytes around the patella.  The osteophytes were  debrided.  We inspected the trochlea and the lateral trochlea, it was all exposed bone.  The medial and lateral compartments looked normal.  There were some marginal osteophytes on the medial, lateral, and femoral condyles and osteophytes were removed.  There were no intercondylar osteophytes.  The ACL was intact and looked normal.  We then everted the patella, measured the thickness to be 21 mm.  The freehand resection was taken to 12 mm.  A 32 template was placed and lug holes were drilled and trial patella was placed.  We then sized for the trochlea.  Size 1 which was the smallest trochlea was placed over the trochlea and traced in methylene blue.  We then removed any remaining cartilage and trochlea and then began burring to create a trough to place the trochlear component.  Once it was adequately created, then we placed the trial component and drilled our 4 holes through the guide. The trial was impacted and the 32 patella tracked normally without any impingement.  The trials were removed and the cut bone surface was then prepared with pulsatile lavage.  Cement was mixed and once ready for implantation the size 1 at the left trochlear component was cemented into place, impacted and extruded cement removed.  The 32 patella was cemented into place and held with a  patellar clamp.  Extruded cement was removed.  Once the cement was fully hardened, then the patellar clamp was removed.  A Hemovac drain was placed.  Synovectomy was performed. Wound was copiously irrigated with saline solution.  A total of 20 mL of Exparel mixed with 30 mL of saline was injected into the periosteum of the femur medial and lateral, medial and lateral retinacula, and the suprapatellar area, as well as the subcu tissues.  An additional 20 mL of 0.25% bupivacaine was injected into the same tissues.  Wound was further irrigated.  Arthrotomy was closed over Hemovac drain with running #1 V-Loc suture.  Flexion  against gravity was 140 degrees. Patella tracked normally without any impingement.  The tourniquet was released with a total time of 33 minutes.  The subcutaneous was then closed with interrupted 2-0 Vicryl, and subcuticular running 4-0 Monocryl.  Incisions cleaned and dried.  Steri-Strips and a bulky sterile dressing applied.  She was placed into a knee immobilizer, awakened and transported to recovery in stable condition.  Note, that a surgical assistant was a medical necessity for this procedure in order to perform in a safe and expeditious manner. Surgical assistant was necessary for appropriate retraction to allow exposure of the trochlea and patella without damaging any other parts of the knee.  Also important for protecting vital neurovascular structures.     Ollen Gross, M.D.     FA/MEDQ  D:  11/23/2013  T:  11/24/2013  Job:  [id number]

## 2013-11-24 NOTE — Care Management Note (Signed)
    Page 1 of 2   11/24/2013     12:58:55 PM   CARE MANAGEMENT NOTE 11/24/2013  Patient:  Diane Fox, Diane Fox   Account Number:  [id number]  Date Initiated:  11/24/2013  Documentation initiated by:  Colleen Can  Subjective/Objective Assessment:   dx left knee patella femoral arthroplasty     Action/Plan:   CM spoke with patient. Plans are for  patient to return to her home in [place] where she will have caregiver. She wants [facility] for [facility] services. Needs 3n1 nad RW.   Anticipated DC Date:  11/24/2013   Anticipated DC Plan:  HOME W HOME HEALTH SERVICES      DC Planning Services  CM consult      [facility] Choice  HOME HEALTH  DURABLE MEDICAL EQUIPMENT   Choice offered to / List presented to:  C-1 Patient   DME arranged  3-N-1  Levan Hurst      DME agency  [facility]     HH arranged  HH-2 PT      [facility] agency  [facility]   Status of service:  Completed, signed off Medicare Important Message given?   (If response is "NO", the following Medicare IM given date fields will be blank) Date Medicare IM given:   Date Additional Medicare IM given:    Discharge Disposition:    Per UR Regulation:  Reviewed for med. necessity/level of care/duration of stay  If discussed at Long Length of Stay Meetings, dates discussed:    Comments:  11/24/2013 Colleen Can BSN RN CCM [phone] [facility] can provide HHpt services with start of dayu after discharge.

## 2013-11-24 NOTE — Discharge Summary (Signed)
Physician Discharge Summary   Patient ID: Diane Fox MRN: [id number] DOB/AGE: July 10, 1951 62 y.o.  Admit date: 11/23/2013 Discharge date: 11-24-2013  Primary Diagnosis:  LEFT KNEE PATELLA FEMORAL OSTEOARTHRITIS   Admission Diagnoses:  Past Medical History  Diagnosis Date  . Hypercholesterolemia   . Atypical chest pain   . Arthritis   . Sinus trouble   . Peri-menopausal     lexapro prn  . Edema     maxide prn  . Atypical lobular hyperplasia of breast 09/2002   Discharge Diagnoses:   Principal Problem:   OA (osteoarthritis) of knee  Estimated body mass index is 30.83 kg/(m^2) as calculated from the following:   Height as of this encounter: 5\' 3"  (1.6 m).   Weight as of this encounter: 78.926 kg (174 lb).  Procedure:  Procedure(s) (LRB): LEFT KNEE PATELLA-FEMORAL ARTHROPLASTY (Left)   Consults: None  HPI: The patient is a 62 year old female who comes in for a preoperative History and Physical. The patient is scheduled for a left patellofemoral arhtroplasty to be performed by Dr. Gus Rankin. Aluisio, MD at [facility] on 11/23/2013.  The patient is a 62 year old female who presents for follow up of their knee. The patient is being followed for their left knee pain and osteoarthritis. Symptoms reported today include: pain. The patient feels that they are doing poorly and report their pain level to be moderate. Current treatment includes: NSAIDs (Aleve or ibuprofen).  The patient had the MRI scan performed to see if she potentially had a meniscal tear causing her problems. Pain remains real significant. It is definitely limiting what she can and cannot do. She has a hard time bending and squatting. She has a hard time going up and down stairs. Pain is anterior and lateral. She does not get swelling. It does feel like it wants to give out on her at times. She cannot do what she desires because of her knee pain. The right hurts also, but to a much lesser degree. She is ready to  get the knee fixed. She presents now for the patellofemoral replacement.  They have been treated conservatively in the past for the above stated problem and despite conservative measures, they continue to have progressive pain and severe functional limitations and dysfunction. They have failed non-operative management including home exercise, medications, and injections. It is felt that they would benefit from undergoing patellofemoral replacement. Risks and benefits of the procedure have been discussed with the patient and they elect to proceed with surgery. There are no active contraindications to surgery such as ongoing infection or rapidly progressive neurological disease.  Laboratory Data: Admission on 11/23/2013  Component Date Value Range Status  . WBC 11/24/2013 11.5* 4.0 - 10.5 K/uL Final  . RBC 11/24/2013 4.42  3.87 - 5.11 MIL/uL Final  . Hemoglobin 11/24/2013 13.4  12.0 - 15.0 g/dL Final  . HCT 14/78/2956 39.0  36.0 - 46.0 % Final  . MCV 11/24/2013 88.2  78.0 - 100.0 fL Final  . MCH 11/24/2013 30.3  26.0 - 34.0 pg Final  . MCHC 11/24/2013 34.4  30.0 - 36.0 g/dL Final  . RDW 21/30/8657 13.2  11.5 - 15.5 % Final  . Platelets 11/24/2013 313  150 - 400 K/uL Final  . Sodium 11/24/2013 139  135 - 145 mEq/L Final  . Potassium 11/24/2013 4.1  3.5 - 5.1 mEq/L Final  . Chloride 11/24/2013 102  96 - 112 mEq/L Final  . CO2 11/24/2013 26  19 -  32 mEq/L Final  . Glucose, Bld 11/24/2013 149* 70 - 99 mg/dL Final  . BUN 04/54/0981 12  6 - 23 mg/dL Final  . Creatinine, Ser 11/24/2013 0.81  0.50 - 1.10 mg/dL Final  . Calcium 19/14/7829 9.1  8.4 - 10.5 mg/dL Final  . GFR calc non Af Amer 11/24/2013 76* >90 mL/min Final  . GFR calc Af Amer 11/24/2013 88* >90 mL/min Final   Comment: (NOTE)                          The eGFR has been calculated using the CKD EPI equation.                          This calculation has not been validated in all clinical situations.                          eGFR's  persistently <90 mL/min signify possible Chronic Kidney                          Disease.  Hospital Outpatient Visit on 11/16/2013  Component Date Value Range Status  . aPTT 11/16/2013 31  24 - 37 seconds Final  . WBC 11/16/2013 6.5  4.0 - 10.5 K/uL Final  . RBC 11/16/2013 4.88  3.87 - 5.11 MIL/uL Final  . Hemoglobin 11/16/2013 14.6  12.0 - 15.0 g/dL Final  . HCT 56/21/3086 43.6  36.0 - 46.0 % Final  . MCV 11/16/2013 89.3  78.0 - 100.0 fL Final  . MCH 11/16/2013 29.9  26.0 - 34.0 pg Final  . MCHC 11/16/2013 33.5  30.0 - 36.0 g/dL Final  . RDW 57/84/6962 13.1  11.5 - 15.5 % Final  . Platelets 11/16/2013 349  150 - 400 K/uL Final  . Sodium 11/16/2013 141  135 - 145 mEq/L Final  . Potassium 11/16/2013 3.5  3.5 - 5.1 mEq/L Final  . Chloride 11/16/2013 102  96 - 112 mEq/L Final  . CO2 11/16/2013 30  19 - 32 mEq/L Final  . Glucose, Bld 11/16/2013 77  70 - 99 mg/dL Final  . BUN 95/28/4132 20  6 - 23 mg/dL Final  . Creatinine, Ser 11/16/2013 0.96  0.50 - 1.10 mg/dL Final  . Calcium 44/12/270 9.5  8.4 - 10.5 mg/dL Final  . Total Protein 11/16/2013 7.2  6.0 - 8.3 g/dL Final  . Albumin 53/66/4403 4.0  3.5 - 5.2 g/dL Final  . AST 47/42/5956 21  0 - 37 U/L Final  . ALT 11/16/2013 29  0 - 35 U/L Final  . Alkaline Phosphatase 11/16/2013 79  39 - 117 U/L Final  . Total Bilirubin 11/16/2013 0.4  0.3 - 1.2 mg/dL Final  . GFR calc non Af Amer 11/16/2013 62* >90 mL/min Final  . GFR calc Af Amer 11/16/2013 72* >90 mL/min Final   Comment: (NOTE)                          The eGFR has been calculated using the CKD EPI equation.                          This calculation has not been validated in all clinical situations.  eGFR's persistently <90 mL/min signify possible Chronic Kidney                          Disease.  Marland Kitchen Prothrombin Time 11/16/2013 12.4  11.6 - 15.2 seconds Final  . INR 11/16/2013 0.94  0.00 - 1.49 Final  . ABO/RH(D) 11/16/2013 A POS   Final  . Antibody Screen  11/16/2013 NEG   Final  . Sample Expiration 11/16/2013 11/30/2013   Final  . MRSA, PCR 11/16/2013 NEGATIVE  NEGATIVE Final  . Staphylococcus aureus 11/16/2013 NEGATIVE  NEGATIVE Final   Comment:                                 The Xpert SA Assay (FDA                          approved for NASAL specimens                          in patients over 92 years of age),                          is one component of                          a comprehensive surveillance                          program.  Test performance has                          been validated by [employer] for patients greater                          than or equal to 17 year old.                          It is not intended                          to diagnose infection nor to                          guide or monitor treatment.  . Color, Urine 11/16/2013 YELLOW  YELLOW Final  . APPearance 11/16/2013 CLEAR  CLEAR Final  . Specific Gravity, Urine 11/16/2013 1.015  1.005 - 1.030 Final  . pH 11/16/2013 6.0  5.0 - 8.0 Final  . Glucose, UA 11/16/2013 NEGATIVE  NEGATIVE mg/dL Final  . Hgb urine dipstick 11/16/2013 NEGATIVE  NEGATIVE Final  . Bilirubin Urine 11/16/2013 NEGATIVE  NEGATIVE Final  . Ketones, ur 11/16/2013 NEGATIVE  NEGATIVE mg/dL Final  . Protein, ur 16/09/9603 NEGATIVE  NEGATIVE mg/dL Final  . Urobilinogen, UA 11/16/2013 0.2  0.0 - 1.0 mg/dL Final  . Nitrite 54/08/8118 NEGATIVE  NEGATIVE Final  . Leukocytes, UA 11/16/2013 NEGATIVE  NEGATIVE Final   MICROSCOPIC NOT DONE ON URINES WITH NEGATIVE PROTEIN, BLOOD, LEUKOCYTES, NITRITE, OR GLUCOSE <  1000 mg/dL.  . ABO/RH(D) 11/16/2013 A POS   Final     X-Rays:Mm Digital Screening  11/23/2013   ADDENDUM REPORT: 11/17/2013 15:36  RECOMMENDATIONS: Screening mammogram in one year.(Code:SM-B-01Y)   Electronically Signed   By: Cain Saupe M.D.   On: 11/17/2013 15:36   11/17/2013   ADDENDUM REPORT: 11/17/2013 15:36  RECOMMENDATIONS: Screening  mammogram in one year.(Code:SM-B-01Y)   Electronically Signed   By: Cain Saupe M.D.   On: 11/17/2013 15:36   11/17/2013   CLINICAL DATA:  Screening.  EXAM: DIGITAL SCREENING BILATERAL MAMMOGRAM WITH CAD  COMPARISON:  Prior studies dating back to 10/17/2007 [facility]  ACR Breast Density Category c: The breasts are heterogeneously dense, which may obscure small masses.  FINDINGS: There are no findings suspicious for malignancy. Images were processed with CAD.  IMPRESSION: No mammographic evidence of malignancy. A result letter of this screening mammogram will be mailed directly to the patient.  BI-RADS CATEGORY  1: Negative  Electronically Signed: By: Cain Saupe M.D. On: 11/10/2013 17:28    EKG: Orders placed in visit on 02/19/13  . EKG 12-LEAD     Hospital Course: SHERRIA RIEMANN is a 62 y.o. who was admitted to [facility]. They were brought to the operating room on 11/23/2013 and underwent Procedure(s): LEFT KNEE PATELLA-FEMORAL ARTHROPLASTY.  Patient tolerated the procedure well and was later transferred to the recovery room and then to the orthopaedic floor for postoperative care.  They were given PO and IV analgesics for pain control following their surgery.  They were given 24 hours of postoperative antibiotics of  Anti-infectives   Start     Dose/Rate Route Frequency Ordered Stop   11/24/13 0200  vancomycin (VANCOCIN) IVPB 1000 mg/200 mL premix     1,000 mg 200 mL/hr over 60 Minutes Intravenous Every 12 hours 11/23/13 1657 11/24/13 0321   11/23/13 1059  vancomycin (VANCOCIN) IVPB 1000 mg/200 mL premix     1,000 mg 200 mL/hr over 60 Minutes Intravenous On call to O.R. 11/23/13 1059 11/23/13 1512     and started on DVT prophylaxis in the form of Xarelto.   PT and OT were ordered for total joint protocol.  Discharge planning consulted to help with postop disposition and equipment needs.  Patient had a decent night on the evening of surgery.  They started to get up OOB with  therapy on day one. Hemovac drain was pulled without difficulty. Dressing was changed on day two and the incision was healing well.  Patient was seen in rounds and was ready to go home following therapy if met all goals.   Discharge Medications: Prior to Admission medications   Medication Sig Start Date End Date Taking? Authorizing Provider  escitalopram (LEXAPRO) 10 MG tablet Take 10 mg by mouth at bedtime.  06/23/13  Yes Patricia Rolen-Grubb, FNP  fexofenadine (ALLEGRA) 180 MG tablet Take 180 mg by mouth daily. As needed   Yes Historical Provider, MD  rosuvastatin (CRESTOR) 5 MG tablet Take 5 mg by mouth at bedtime. 02/19/13 02/19/14 Yes Peter M [place], MD  triamterene-hydrochlorothiazide (MAXZIDE) 75-50 MG per tablet Take 0.5-1 tablets by mouth daily.    Yes Historical Provider, MD  methocarbamol (ROBAXIN) 500 MG tablet Take 1 tablet (500 mg total) by mouth every 6 (six) hours as needed for muscle spasms. 11/24/13   Alexzandrew Perkins, PA-C  oxyCODONE (OXY IR/ROXICODONE) 5 MG immediate release tablet Take 1-2 tablets (5-10 mg total) by mouth every 3 (three) hours as needed for  breakthrough pain. 11/24/13   Alexzandrew Julien Girt, PA-C  rivaroxaban (XARELTO) 10 MG TABS tablet Take 1 tablet (10 mg total) by mouth daily with breakfast. Take Xarelto for one week, then discontinue Xarelto. Once the patient has completed the blood thinner regimen, then take a 325 mg Aspirin daily for two more weeks. 11/24/13   Alexzandrew Perkins, PA-C  traMADol (ULTRAM) 50 MG tablet Take 1-2 tablets (50-100 mg total) by mouth every 6 (six) hours as needed (mild pain). 11/24/13   Alexzandrew Julien Girt, PA-C    Diet: Cardiac diet Activity:WBAT Follow-up:in 2 weeks Disposition - Home Discharged Condition: good   Discharge Orders   Future Appointments Provider Department Dept Phone   03/02/2014 8:15 AM [facility] [place] Office [phone]   03/04/2014 8:00 AM Peter M [place], MD [facility] [place]  Office [phone]   06/29/2014 9:00 AM Lauro Franklin, FNP [facility] [phone]   Future Orders Complete By Expires   Call MD / Call 911  As directed    Comments:     If you experience chest pain or shortness of breath, CALL 911 and be transported to the hospital emergency room.  If you develope a fever above 101 F, pus (white drainage) or increased drainage or redness at the wound, or calf pain, call your surgeon's office.   Change dressing  As directed    Comments:     Change dressing daily with sterile 4 x 4 inch gauze dressing and apply TED hose. Do not submerge the incision under water.   Constipation Prevention  As directed    Comments:     Drink plenty of fluids.  Prune juice may be helpful.  You may use a stool softener, such as Colace (over the counter) 100 mg twice a day.  Use MiraLax (over the counter) for constipation as needed.   Diet - low sodium heart healthy  As directed    Discharge instructions  As directed    Comments:     Pick up stool softner and laxative for home. Do not submerge incision under water. May shower starting Thursday. Continue to use ice for pain and swelling from surgery.  Take Xarelto for one week, then discontinue Xarelto. Once the patient has completed the blood thinner regimen, then take a 325 mg Aspirin daily for two more weeks.   Do not put a pillow under the knee. Place it under the heel.  As directed    Do not sit on low chairs, stoools or toilet seats, as it may be difficult to get up from low surfaces  As directed    Driving restrictions  As directed    Comments:     No driving until released by the physician.   Increase activity slowly as tolerated  As directed    Lifting restrictions  As directed    Comments:     No lifting until released by the physician.   Patient may shower  As directed    Comments:     You may shower without a dressing once there is no drainage.  Do not wash over the wound.  If drainage  remains, do not shower until drainage stops.   TED hose  As directed    Comments:     Use stockings (TED hose) for 3 weeks on both leg(s).  You may remove them at night for sleeping.   Weight bearing as tolerated  As directed    Questions:  Laterality:     Extremity:         Medication List    STOP taking these medications       cholecalciferol 400 UNITS Tabs tablet  Commonly known as:  VITAMIN D     multivitamin per tablet     PROBIOTIC DAILY PO      TAKE these medications       escitalopram 10 MG tablet  Commonly known as:  LEXAPRO  Take 10 mg by mouth at bedtime.     fexofenadine 180 MG tablet  Commonly known as:  ALLEGRA  Take 180 mg by mouth daily. As needed     methocarbamol 500 MG tablet  Commonly known as:  ROBAXIN  Take 1 tablet (500 mg total) by mouth every 6 (six) hours as needed for muscle spasms.     oxyCODONE 5 MG immediate release tablet  Commonly known as:  Oxy IR/ROXICODONE  Take 1-2 tablets (5-10 mg total) by mouth every 3 (three) hours as needed for breakthrough pain.     rivaroxaban 10 MG Tabs tablet  Commonly known as:  XARELTO  - Take 1 tablet (10 mg total) by mouth daily with breakfast. Take Xarelto for one week, then discontinue Xarelto.  - Once the patient has completed the blood thinner regimen, then take a 325 mg Aspirin daily for two more weeks.     rosuvastatin 5 MG tablet  Commonly known as:  CRESTOR  Take 5 mg by mouth at bedtime.     traMADol 50 MG tablet  Commonly known as:  ULTRAM  Take 1-2 tablets (50-100 mg total) by mouth every 6 (six) hours as needed (mild pain).     triamterene-hydrochlorothiazide 75-50 MG per tablet  Commonly known as:  MAXZIDE  Take 0.5-1 tablets by mouth daily.           Follow-up Information   Follow up with Loanne Drilling, MD. Schedule an appointment as soon as possible for a visit in 2 weeks.   Specialty:  Orthopedic Surgery   Contact information:   [address] [place] [address] [phone]       Signed: Patrica Duel 11/24/2013, 8:49 AM

## 2013-11-24 NOTE — Progress Notes (Signed)
11/24/13 1400  PT Visit Information  Last PT Received On 11/24/13  Assistance Needed +1  History of Present Illness left knee patella femoral arthroplasty  PT Time Calculation  PT Start Time 1327  PT Stop Time 1352  PT Time Calculation (min) 25 min  Subjective Data  Patient Stated Goal return to I  Precautions  Precautions Knee  Required Braces or Orthoses Knee Immobilizer - Left  Knee Immobilizer - Left Discontinue once straight leg raise with < 10 degree lag  Restrictions  Other Position/Activity Restrictions WBAT  Cognition  Arousal/Alertness Awake/alert  Behavior During Therapy WFL for tasks assessed/performed  Overall Cognitive Status Within Functional Limits for tasks assessed  Bed Mobility  Bed Mobility Sit to Supine  Sit to Supine 5: Supervision  Details for Bed Mobility Assistance incr time  Transfers  Transfers Sit to Stand;Stand to Sit  Sit to Stand 5: Supervision  Stand to Sit 5: Supervision  Details for Transfer Assistance occasional cues for hand placement, pt self corrects  Ambulation/Gait  Ambulation/Gait Assistance 5: Supervision  Ambulation Distance (Feet) 200 Feet  Assistive device Rolling walker  Ambulation/Gait Assistance Details cues for step through  Gait Pattern Step-through pattern  General Gait Details improved gait pattern from am session  Stairs Yes  Stair Management Technique Forwards;Step to pattern;Other (comment) (HHA)  Number of Stairs 4  Total Joint Exercises  Ankle Circles/Pumps AROM;Both;10 reps  Quad Sets AROM;Both;10 reps  Heel Slides AAROM;Left;10 reps  Short Arc Quad AROM;Left;10 reps  Hip ABduction/ADduction AROM;AAROM;Left;10 reps  Goniometric ROM ~65  Straight Leg Raises AROM;AAROM;Left;10 reps  PT - End of Session  Equipment Utilized During Treatment Left knee immobilizer;Gait belt  Activity Tolerance Patient tolerated treatment well  Patient left in bed;with call bell/phone within reach  Nurse Communication Mobility  status  PT - Assessment/Plan  PT Plan Current plan remains appropriate  PT Frequency 7X/week  Follow Up Recommendations Home health PT  PT equipment Rolling walker with 5" wheels  PT Goal Progression  Progress towards PT goals Progressing toward goals  Acute Rehab PT Goals  Time For Goal Achievement 12/01/13  Potential to Achieve Goals Good  PT General Charges  $$ ACUTE PT VISIT 1 Procedure  PT Treatments  $Gait Training 8-22 mins  $Therapeutic Exercise 8-22 mins

## 2013-12-16 ENCOUNTER — Ambulatory Visit: Payer: 59 | Attending: Orthopedic Surgery | Admitting: Physical Therapy

## 2013-12-16 DIAGNOSIS — R269 Unspecified abnormalities of gait and mobility: Secondary | ICD-10-CM | POA: Insufficient documentation

## 2013-12-16 DIAGNOSIS — M25669 Stiffness of unspecified knee, not elsewhere classified: Secondary | ICD-10-CM | POA: Insufficient documentation

## 2013-12-16 DIAGNOSIS — IMO0001 Reserved for inherently not codable concepts without codable children: Secondary | ICD-10-CM | POA: Insufficient documentation

## 2013-12-16 DIAGNOSIS — M25569 Pain in unspecified knee: Secondary | ICD-10-CM | POA: Insufficient documentation

## 2013-12-16 DIAGNOSIS — R609 Edema, unspecified: Secondary | ICD-10-CM | POA: Insufficient documentation

## 2013-12-21 ENCOUNTER — Encounter: Payer: 59 | Admitting: Physical Therapy

## 2013-12-21 ENCOUNTER — Ambulatory Visit: Payer: 59 | Attending: Orthopedic Surgery | Admitting: Physical Therapy

## 2013-12-21 DIAGNOSIS — R269 Unspecified abnormalities of gait and mobility: Secondary | ICD-10-CM | POA: Insufficient documentation

## 2013-12-21 DIAGNOSIS — M25669 Stiffness of unspecified knee, not elsewhere classified: Secondary | ICD-10-CM | POA: Insufficient documentation

## 2013-12-21 DIAGNOSIS — R609 Edema, unspecified: Secondary | ICD-10-CM | POA: Insufficient documentation

## 2013-12-21 DIAGNOSIS — IMO0001 Reserved for inherently not codable concepts without codable children: Secondary | ICD-10-CM | POA: Insufficient documentation

## 2013-12-21 DIAGNOSIS — M25569 Pain in unspecified knee: Secondary | ICD-10-CM | POA: Insufficient documentation

## 2013-12-24 ENCOUNTER — Ambulatory Visit: Payer: 59 | Admitting: Physical Therapy

## 2013-12-28 ENCOUNTER — Ambulatory Visit: Payer: 59 | Admitting: Physical Therapy

## 2013-12-30 ENCOUNTER — Encounter: Payer: 59 | Admitting: Physical Therapy

## 2013-12-31 ENCOUNTER — Ambulatory Visit: Payer: 59 | Admitting: Physical Therapy

## 2014-01-05 ENCOUNTER — Ambulatory Visit: Payer: 59 | Admitting: Physical Therapy

## 2014-01-12 ENCOUNTER — Ambulatory Visit: Payer: 59 | Admitting: Physical Therapy

## 2014-01-14 ENCOUNTER — Ambulatory Visit: Payer: 59 | Admitting: Physical Therapy

## 2014-02-14 DIAGNOSIS — R0789 Other chest pain: Secondary | ICD-10-CM

## 2014-02-14 HISTORY — DX: Other chest pain: R07.89

## 2014-03-02 ENCOUNTER — Ambulatory Visit (INDEPENDENT_AMBULATORY_CARE_PROVIDER_SITE_OTHER): Payer: 59 | Admitting: *Deleted

## 2014-03-02 DIAGNOSIS — R059 Cough, unspecified: Secondary | ICD-10-CM

## 2014-03-02 DIAGNOSIS — R053 Chronic cough: Secondary | ICD-10-CM

## 2014-03-02 DIAGNOSIS — E7800 Pure hypercholesterolemia, unspecified: Secondary | ICD-10-CM

## 2014-03-02 DIAGNOSIS — R05 Cough: Secondary | ICD-10-CM

## 2014-03-02 LAB — HEPATIC FUNCTION PANEL
ALT: 35 U/L (ref 0–35)
AST: 25 U/L (ref 0–37)
Albumin: 4.1 g/dL (ref 3.5–5.2)
Alkaline Phosphatase: 72 U/L (ref 39–117)
BILIRUBIN DIRECT: 0 mg/dL (ref 0.0–0.3)
BILIRUBIN TOTAL: 0.5 mg/dL (ref 0.3–1.2)
Total Protein: 7 g/dL (ref 6.0–8.3)

## 2014-03-02 LAB — LIPID PANEL
CHOL/HDL RATIO: 3
Cholesterol: 174 mg/dL (ref 0–200)
HDL: 60.9 mg/dL (ref >39.00)
LDL Cholesterol: 84 mg/dL (ref 0–99)
Triglycerides: 144 mg/dL (ref 0.0–149.0)
VLDL: 28.8 mg/dL (ref 0.0–40.0)

## 2014-03-02 LAB — CBC WITH DIFFERENTIAL/PLATELET
BASOS ABS: 0 10*3/uL (ref 0.0–0.1)
Basophils Relative: 0.6 % (ref 0.0–3.0)
EOS PCT: 2.6 % (ref 0.0–5.0)
Eosinophils Absolute: 0.2 10*3/uL (ref 0.0–0.7)
HEMATOCRIT: 41.8 % (ref 36.0–46.0)
HEMOGLOBIN: 13.8 g/dL (ref 12.0–15.0)
LYMPHS ABS: 2.8 10*3/uL (ref 0.7–4.0)
Lymphocytes Relative: 41.6 % (ref 12.0–46.0)
MCHC: 33 g/dL (ref 30.0–36.0)
MCV: 90.5 fl (ref 78.0–100.0)
Monocytes Absolute: 0.4 10*3/uL (ref 0.1–1.0)
Monocytes Relative: 6.6 % (ref 3.0–12.0)
NEUTROS ABS: 3.2 10*3/uL (ref 1.4–7.7)
Neutrophils Relative %: 48.6 % (ref 43.0–77.0)
Platelets: 314 10*3/uL (ref 150.0–400.0)
RBC: 4.61 Mil/uL (ref 3.87–5.11)
RDW: 12.9 % (ref 11.5–14.6)
WBC: 6.6 10*3/uL (ref 4.5–10.5)

## 2014-03-02 LAB — BASIC METABOLIC PANEL
BUN: 19 mg/dL (ref 6–23)
CO2: 28 mEq/L (ref 19–32)
Calcium: 9.1 mg/dL (ref 8.4–10.5)
Chloride: 103 mEq/L (ref 96–112)
Creatinine, Ser: 1 mg/dL (ref 0.4–1.2)
GFR: 59.47 mL/min — ABNORMAL LOW (ref >60.00)
GLUCOSE: 100 mg/dL — AB (ref 70–99)
POTASSIUM: 3.8 meq/L (ref 3.5–5.1)
SODIUM: 140 meq/L (ref 135–145)

## 2014-03-04 ENCOUNTER — Encounter: Payer: Self-pay | Admitting: Cardiology

## 2014-03-04 ENCOUNTER — Ambulatory Visit (INDEPENDENT_AMBULATORY_CARE_PROVIDER_SITE_OTHER): Payer: 59 | Admitting: Cardiology

## 2014-03-04 VITALS — BP 124/64 | HR 67 | Ht 63.0 in | Wt 177.0 lb

## 2014-03-04 DIAGNOSIS — R0789 Other chest pain: Secondary | ICD-10-CM

## 2014-03-04 DIAGNOSIS — E7800 Pure hypercholesterolemia, unspecified: Secondary | ICD-10-CM

## 2014-03-04 DIAGNOSIS — R079 Chest pain, unspecified: Secondary | ICD-10-CM

## 2014-03-04 MED ORDER — ROSUVASTATIN CALCIUM 5 MG PO TABS: 5.0000 mg | ORAL_TABLET | Freq: Every day | ORAL | Status: DC

## 2014-03-04 NOTE — Patient Instructions (Signed)
We will schedule you for a stress Echo.   Continue your current therapy

## 2014-03-04 NOTE — Progress Notes (Signed)
Diane Fox Date of Birth: [birth year] Medical Record #[id number]  History of Present Illness: Seen today for yearly followup. She works in Engineer, technical sales for [employer].She has a history of hypercholesterolemia and is on Crestor therapy. She reports that over the last 2-3 weeks she has experienced left precordial chest pain. It is brief and takes her breath away. She thinks it is related to stress at work. It is nonexertional. Sometimes radiates to left arm. She has gained 9 lbs in a year. She did have a stress myoview in Nov. 2010 which was normal. Echo in 2009 was normal.  Current Outpatient Prescriptions on File Prior to Visit  Medication Sig Dispense Refill  . escitalopram (LEXAPRO) 10 MG tablet Take 10 mg by mouth at bedtime.       . fexofenadine (ALLEGRA) 180 MG tablet Take 180 mg by mouth daily. As needed      . triamterene-hydrochlorothiazide (MAXZIDE) 75-50 MG per tablet Take 0.5-1 tablets by mouth daily.        No current facility-administered medications on file prior to visit.    Allergies  Allergen Reactions  . Biaxin [Clarithromycin] Hives  . Penicillins Hives  . Sulfa Antibiotics Hives  . Zostavax [Zoster Vaccine Live] Hives    Past Medical History  Diagnosis Date  . Hypercholesterolemia   . Atypical chest pain   . Arthritis   . Sinus trouble   . Peri-menopausal     lexapro prn  . Edema     maxide prn  . 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 Right oophorectomy Right 11/91    tubal ligation with complications  . Breast bx  09/2002    breast sterootactec biopsy  . Hysteroscopy      polyp removed-benign  . Polypectomy  07/2007    hysteroscopic with removal of polyp Dr. Sabra Heck  . Endometrial biopsy  08/2010    proliferative endo  . Tubal ligation  1991  . Oophorectomy Right 10/1990  . Breast surgery  10/03    focal atypia hyperplasia  . Patella-femoral arthroplasty Left 11/23/2013    Procedure:  LEFT KNEE PATELLA-FEMORAL ARTHROPLASTY;  Surgeon: Gearlean Alf, MD;  Location: [facility];  Service: Orthopedics;  Laterality: Left;    History  Smoking status  . Never Smoker   Smokeless tobacco  . Never Used    History  Alcohol Use  . 0.5 oz/week  . 1 drink(s) per week    Comment: 1 glass of wine per week    Family History  Problem Relation Age of Onset  . Heart disease Father   . Heart attack Father   . Hypertension Father   . Heart disease Mother   . Hypertension Mother   . Asthma Brother   . Cancer Maternal Grandmother     lymphoma  . Cancer Maternal Grandfather     prostate cancer    Review of Systems: As noted in history of present illness..  All other systems were reviewed and are negative.  Physical Exam: BP 124/64  Pulse 67  Ht 5\' 3"  (1.6 m)  Wt 177 lb (80.287 kg)  BMI 31.36 kg/m2  LMP 10/17/2010 The patient is alert and oriented x 3. The HEENT exam is normal. The carotids are 2+ without bruits.  There is no thyromegaly.  There is no JVD.  The lungs are clear.  The heart exam reveals a regular rate with a normal S1 and S2.  There are no murmurs, gallops, or rubs.  The PMI is not displaced.     Exam of the legs reveal no clubbing, cyanosis, or edema.  Surgical scar on left knee.  The distal pulses are intact.  Cranial nerves II - XII are intact.  Motor and sensory functions are intact.  The gait is normal.  LABORATORY DATA: ECG today demonstrates normal sinus rhythm with a normal ECG. Rate 67 bpm. Lab Results  Component Value Date   WBC 6.6 03/02/2014   HGB 13.8 03/02/2014   HCT 41.8 03/02/2014   PLT 314.0 03/02/2014   GLUCOSE 100* 03/02/2014   CHOL 174 03/02/2014   TRIG 144.0 03/02/2014   HDL 60.90 03/02/2014   LDLCALC 84 03/02/2014   ALT 35 03/02/2014   AST 25 03/02/2014   NA 140 03/02/2014   K 3.8 03/02/2014   CL 103 03/02/2014   CREATININE 1.0 03/02/2014   BUN 19 03/02/2014   CO2 28 03/02/2014   INR 0.94 11/16/2013   Assessment / Plan: 1.  Hypercholesterolemia. Well controlled on crestor. Continue Rx.  2. Atypical chest pain. I have recommended a stress Echo to rule out ischemia. If normal we will just follow up in one year.

## 2014-03-19 ENCOUNTER — Ambulatory Visit (HOSPITAL_BASED_OUTPATIENT_CLINIC_OR_DEPARTMENT_OTHER): Payer: 59 | Admitting: Radiology

## 2014-03-19 ENCOUNTER — Ambulatory Visit (HOSPITAL_COMMUNITY): Payer: 59 | Attending: Cardiovascular Disease

## 2014-03-19 DIAGNOSIS — R072 Precordial pain: Secondary | ICD-10-CM

## 2014-03-19 DIAGNOSIS — R0989 Other specified symptoms and signs involving the circulatory and respiratory systems: Secondary | ICD-10-CM

## 2014-03-19 DIAGNOSIS — Z8249 Family history of ischemic heart disease and other diseases of the circulatory system: Secondary | ICD-10-CM | POA: Insufficient documentation

## 2014-03-19 DIAGNOSIS — E7800 Pure hypercholesterolemia, unspecified: Secondary | ICD-10-CM

## 2014-03-19 DIAGNOSIS — R0789 Other chest pain: Secondary | ICD-10-CM

## 2014-03-19 DIAGNOSIS — E785 Hyperlipidemia, unspecified: Secondary | ICD-10-CM | POA: Insufficient documentation

## 2014-03-19 NOTE — Progress Notes (Signed)
Stress Echocardiogram performed.  

## 2014-06-17 ENCOUNTER — Other Ambulatory Visit: Payer: Self-pay | Admitting: Orthopedic Surgery

## 2014-06-18 ENCOUNTER — Encounter (HOSPITAL_COMMUNITY): Payer: Self-pay | Admitting: Pharmacy Technician

## 2014-06-24 ENCOUNTER — Ambulatory Visit (HOSPITAL_COMMUNITY)
Admission: RE | Admit: 2014-06-24 | Discharge: 2014-06-24 | Disposition: A | Discharge status: Home / Self Care | Payer: 59 | Source: Ambulatory Visit | Attending: Anesthesiology | Admitting: Anesthesiology

## 2014-06-24 ENCOUNTER — Encounter (HOSPITAL_COMMUNITY)
Admission: RE | Admit: 2014-06-24 | Discharge: 2014-06-24 | Disposition: A | Discharge status: Home / Self Care | Payer: 59 | Source: Ambulatory Visit | Attending: Orthopedic Surgery | Admitting: Orthopedic Surgery

## 2014-06-24 ENCOUNTER — Encounter (HOSPITAL_COMMUNITY): Payer: Self-pay

## 2014-06-24 DIAGNOSIS — Z01818 Encounter for other preprocedural examination: Secondary | ICD-10-CM | POA: Insufficient documentation

## 2014-06-24 DIAGNOSIS — Z01812 Encounter for preprocedural laboratory examination: Secondary | ICD-10-CM | POA: Insufficient documentation

## 2014-06-24 HISTORY — DX: Personal history of other infectious and parasitic diseases: Z86.19

## 2014-06-24 HISTORY — DX: Unspecified asthma, uncomplicated: J45.909

## 2014-06-24 HISTORY — DX: Other allergy status, other than to drugs and biological substances: Z91.09

## 2014-06-24 LAB — SURGICAL PCR SCREEN
MRSA, PCR: NEGATIVE
Staphylococcus aureus: NEGATIVE

## 2014-06-24 LAB — PROTIME-INR
INR: 0.94 (ref 0.00–1.49)
PROTHROMBIN TIME: 12.6 s (ref 11.6–15.2)

## 2014-06-24 LAB — CBC
HCT: 40 % (ref 36.0–46.0)
Hemoglobin: 13.7 g/dL (ref 12.0–15.0)
MCH: 29.8 pg (ref 26.0–34.0)
MCHC: 34.3 g/dL (ref 30.0–36.0)
MCV: 87.1 fL (ref 78.0–100.0)
PLATELETS: 315 10*3/uL (ref 150–400)
RBC: 4.59 MIL/uL (ref 3.87–5.11)
RDW: 13.1 % (ref 11.5–15.5)
WBC: 6.2 10*3/uL (ref 4.0–10.5)

## 2014-06-24 LAB — BASIC METABOLIC PANEL
ANION GAP: 11 (ref 5–15)
BUN: 19 mg/dL (ref 6–23)
CHLORIDE: 100 meq/L (ref 96–112)
CO2: 28 mEq/L (ref 19–32)
CREATININE: 0.96 mg/dL (ref 0.50–1.10)
Calcium: 9.7 mg/dL (ref 8.4–10.5)
GFR calc Af Amer: 71 mL/min — ABNORMAL LOW (ref >90)
GFR calc non Af Amer: 62 mL/min — ABNORMAL LOW (ref >90)
Glucose, Bld: 96 mg/dL (ref 70–99)
Potassium: 3.9 mEq/L (ref 3.7–5.3)
Sodium: 139 mEq/L (ref 137–147)

## 2014-06-24 LAB — URINE MICROSCOPIC-ADD ON

## 2014-06-24 LAB — URINALYSIS, ROUTINE W REFLEX MICROSCOPIC
Bilirubin Urine: NEGATIVE
GLUCOSE, UA: NEGATIVE mg/dL
Hgb urine dipstick: NEGATIVE
KETONES UR: NEGATIVE mg/dL
Nitrite: NEGATIVE
Protein, ur: NEGATIVE mg/dL
Specific Gravity, Urine: 1.02 (ref 1.005–1.030)
Urobilinogen, UA: 0.2 mg/dL (ref 0.0–1.0)
pH: 6.5 (ref 5.0–8.0)

## 2014-06-24 LAB — APTT: aPTT: 35 seconds (ref 24–37)

## 2014-06-24 NOTE — Patient Instructions (Addendum)
19 Diane HASEGAWA  06/24/2014   Your procedure is scheduled on: Monday 07/05/14  Report to [facility] at 10:45 AM.  Call this number if you have problems the morning of surgery [phone]   Remember:   Do not eat food After Midnight, clear liquids from midnight until 7:45 am on 07/05/14 then nothing.      Take these medicines the morning of surgery with A SIP OF WATER: allegra and eye drops if needed   Do not wear jewelry, make-up or nail polish.  Do not wear lotions, powders, or perfumes. You may wear deodorant.  Do not shave 48 hours prior to surgery. Men may shave face and neck.  Do not bring valuables to the hospital.  Contacts, dentures or bridgework may not be worn into surgery.  Leave suitcase in the car. After surgery it may be brought to your room.  For patients admitted to the hospital, checkout time is 11:00 AM the day of discharge.   Please read over the following fact sheets that you were given: MRSA Information Paulette Blanch, RN  pre op nurse call if needed [phone]    [facility] - Preparing for Surgery Before surgery, you can play an important role.  Because skin is not sterile, your skin needs to be as free of germs as possible.  You can reduce the number of germs on your skin by washing with CHG (chlorahexidine gluconate) soap before surgery.  CHG is an antiseptic cleaner which kills germs and bonds with the skin to continue killing germs even after washing. Please DO NOT use if you have an allergy to CHG or antibacterial soaps.  If your skin becomes reddened/irritated stop using the CHG and inform your nurse when you arrive at Short Stay. Do not shave (including legs and underarms) for at least 48 hours prior to the first CHG shower.  You may shave your face/neck. Please follow these instructions carefully:  1.  Shower with CHG Soap the night before surgery and the  morning of Surgery.  2.  If you choose to wash your hair, wash your hair first  as usual with your  normal  shampoo.  3.  After you shampoo, rinse your hair and body thoroughly to remove the  shampoo.                            4.  Use CHG as you would any other liquid soap.  You can apply chg directly  to the skin and wash                       Gently with a scrungie or clean washcloth.  5.  Apply the CHG Soap to your body ONLY FROM THE NECK DOWN.   Do not use on face/ open                           Wound or open sores. Avoid contact with eyes, ears mouth and genitals (private parts).                       Wash face,  Genitals (private parts) with your normal soap.             6.  Wash thoroughly, paying special attention to the area where your surgery  will be performed.  7.  Thoroughly  rinse your body with warm water from the neck down.  8.  DO NOT shower/wash with your normal soap after using and rinsing off  the CHG Soap.                9.  Pat yourself dry with a clean towel.            10.  Wear clean pajamas.            11.  Place clean sheets on your bed the night of your first shower and do not  sleep with pets. Day of Surgery : Do not apply any lotions/deodorants the morning of surgery.  Please wear clean clothes to the hospital/surgery center.  FAILURE TO FOLLOW THESE INSTRUCTIONS MAY RESULT IN THE CANCELLATION OF YOUR SURGERY PATIENT SIGNATURE_________________________________  NURSE SIGNATURE__________________________________  ________________________________________________________________________    CLEAR LIQUID DIET   Foods Allowed                                                                     Foods Excluded  Coffee and tea, regular and decaf                             liquids that you cannot  Plain Jell-O in any flavor                                             see through such as: Fruit ices (not with fruit pulp)                                     milk, soups, orange juice  Iced Popsicles                                    All solid  food Carbonated beverages, regular and diet                                    Cranberry, grape and apple juices Sports drinks like Gatorade Lightly seasoned clear broth or consume(fat free) Sugar, honey syrup  Sample Menu Breakfast                                Lunch                                     Supper Cranberry juice                    Beef broth                            Chicken broth Jell-O  Grape juice                           Apple juice Coffee or tea                        Jell-O                                      Popsicle                                                Coffee or tea                        Coffee or tea  _____________________________________________________________________    Incentive Spirometer  An incentive spirometer is a tool that can help keep your lungs clear and active. This tool measures how well you are filling your lungs with each breath. Taking long deep breaths may help reverse or decrease the chance of developing breathing (pulmonary) problems (especially infection) following:  A long period of time when you are unable to move or be active. BEFORE THE PROCEDURE   If the spirometer includes an indicator to show your best effort, your nurse or respiratory therapist will set it to a desired goal.  If possible, sit up straight or lean slightly forward. Try not to slouch.  Hold the incentive spirometer in an upright position. INSTRUCTIONS FOR USE  1. Sit on the edge of your bed if possible, or sit up as far as you can in bed or on a chair. 2. Hold the incentive spirometer in an upright position. 3. Breathe out normally. 4. Place the mouthpiece in your mouth and seal your lips tightly around it. 5. Breathe in slowly and as deeply as possible, raising the piston or the ball toward the top of the column. 6. Hold your breath for 3-5 seconds or for as long as possible. Allow the piston or ball to fall to the  bottom of the column. 7. Remove the mouthpiece from your mouth and breathe out normally. 8. Rest for a few seconds and repeat Steps 1 through 7 at least 10 times every 1-2 hours when you are awake. Take your time and take a few normal breaths between deep breaths. 9. The spirometer may include an indicator to show your best effort. Use the indicator as a goal to work toward during each repetition. 10. After each set of 10 deep breaths, practice coughing to be sure your lungs are clear. If you have an incision (the cut made at the time of surgery), support your incision when coughing by placing a pillow or rolled up towels firmly against it. Once you are able to get out of bed, walk around indoors and cough well. You may stop using the incentive spirometer when instructed by your caregiver.  RISKS AND COMPLICATIONS  Take your time so you do not get dizzy or light-headed.  If you are in pain, you may need to take or ask for pain medication before doing incentive spirometry. It is harder to take a deep breath if you are having pain. AFTER USE  Rest and breathe slowly and easily.  It can be  helpful to keep track of a log of your progress. Your caregiver can provide you with a simple table to help with this. If you are using the spirometer at home, follow these instructions: [place] IF:   You are having difficultly using the spirometer.  You have trouble using the spirometer as often as instructed.  Your pain medication is not giving enough relief while using the spirometer.  You develop fever of 100.5 F (38.1 C) or higher. SEEK IMMEDIATE MEDICAL CARE IF:   You cough up bloody sputum that had not been present before.  You develop fever of 102 F (38.9 C) or greater.  You develop worsening pain at or near the incision site. MAKE SURE YOU:   Understand these instructions.  Will watch your condition.  Will get help right away if you are not doing well or get  worse. Document Released: 04/15/2007 Document Revised: 02/25/2012 Document Reviewed: 06/16/2007 [facility] Patient Information 2014 [place], [place].   ________________________________________________________________________

## 2014-06-24 NOTE — Progress Notes (Signed)
EKG 03/04/14 on EPIC, surgery clearance note 06/02/14 Dr. Rex Kras on chart, LOV note Dr. Rex Kras 06/02/14 on chart

## 2014-06-25 ENCOUNTER — Ambulatory Visit: Payer: 59 | Admitting: Nurse Practitioner

## 2014-06-25 NOTE — Progress Notes (Signed)
Fax received from dr Wynelle Link and placed on pt chart, no action on ua results

## 2014-06-29 ENCOUNTER — Ambulatory Visit (INDEPENDENT_AMBULATORY_CARE_PROVIDER_SITE_OTHER): Payer: 59 | Admitting: Nurse Practitioner

## 2014-06-29 ENCOUNTER — Encounter: Payer: Self-pay | Admitting: Nurse Practitioner

## 2014-06-29 VITALS — BP 134/84 | HR 68 | Resp 18 | Ht 63.0 in | Wt 178.0 lb

## 2014-06-29 DIAGNOSIS — M1990 Unspecified osteoarthritis, unspecified site: Secondary | ICD-10-CM

## 2014-06-29 DIAGNOSIS — Z01419 Encounter for gynecological examination (general) (routine) without abnormal findings: Secondary | ICD-10-CM

## 2014-06-29 DIAGNOSIS — M129 Arthropathy, unspecified: Secondary | ICD-10-CM

## 2014-06-29 DIAGNOSIS — Z0000 Encounter for general adult medical examination without abnormal findings: Secondary | ICD-10-CM

## 2014-06-29 NOTE — Progress Notes (Signed)
63 y.o. [id number] Married Caucasian Fe here for annual exam.  Partial left knee replacement 12/ 2014.  Now trouble with popping and will be having another revision or a replacement on 07/05/14.   able to do most things but over use increases pain.     Patient's last menstrual period was 10/17/2010.          Sexually active: No.  The current method of family planning is post menopausal status.    Exercising: Yes.    Walk daily, Weights 2 x weekly Smoker:  no  Health Maintenance: Pap:  05/2012 Neg. HR HPV:Neg MMG:  10/2013 BIRADS1: Neg Colonoscopy:  2012 normal- every 10 years  BMD:  10/2010 TDaP:  2007 Labs: PCP.& Dr. Martinique   reports that she has never smoked. She has never used smokeless tobacco. She reports that she drinks about .6 ounces of alcohol per week. She reports that she does not use illicit drugs.  Past Medical History  Diagnosis Date  . Hypercholesterolemia   . Atypical chest pain march 2015    april stress test=normal  . Arthritis   . Peri-menopausal     lexapro prn  . Edema     maxide prn  . 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 H/O measles   . H/O mumps   . Asthma     allergy related, no attacks  . Environmental allergies     Past Surgical History  Procedure Laterality Date  . Right oophorectomy Right 11/91    tubal ligation with complications  . Breast bx  09/2002    breast sterootactec biopsy  . Hysteroscopy  2007    polyp removed-benign  . Polypectomy  07/2007    hysteroscopic with removal of polyp Dr. Sabra Heck  . Endometrial biopsy  08/2010    proliferative endo  . Tubal ligation  1991  . Patella-femoral arthroplasty Left 11/23/2013    Procedure: LEFT KNEE PATELLA-FEMORAL ARTHROPLASTY;  Surgeon: Gearlean Alf, MD;  Location: [facility];  Service: Orthopedics;  Laterality: Left;  . Dilation and curettage of uterus      x2  . Breast surgery Right 10/03    focal atypia hyperplasia  . Wisdom tooth extraction  early 20's  .  Colonoscopy      Current Outpatient Prescriptions  Medication Sig Dispense Refill  . escitalopram (LEXAPRO) 10 MG tablet Take 10 mg by mouth at bedtime.       . fexofenadine (ALLEGRA) 180 MG tablet Take 180 mg by mouth daily as needed for allergies.       . Multiple Vitamin (MULTIVITAMIN WITH MINERALS) TABS tablet Take 1 tablet by mouth daily.      . Polyvinyl Alcohol-Povidone (REFRESH OP) Apply 1 drop to eye as needed (Dry eyes).      . rosuvastatin (CRESTOR) 5 MG tablet Take 1 tablet (5 mg total) by mouth at bedtime.  90 tablet  3  . triamterene-hydrochlorothiazide (MAXZIDE) 75-50 MG per tablet Take 1 tablet by mouth every morning.        No current facility-administered medications for this visit.    Family History  Problem Relation Age of Onset  . Heart disease Father   . Heart attack Father   . Hypertension Father   . Heart disease Mother   . Hypertension Mother   . Asthma Brother   . Cancer Maternal Grandmother     lymphoma  . Cancer Maternal Grandfather     prostate cancer  ROS:  Pertinent items are noted in HPI.  Otherwise, a comprehensive ROS was negative.  Exam:   BP 134/84  Pulse 68  Resp 18  Ht 5\' 3"  (1.6 m)  Wt 178 lb (80.74 kg)  BMI 31.54 kg/m2  LMP 10/17/2010 Height: 5\' 3"  (160 cm)  Ht Readings from Last 3 Encounters:  06/29/14 5\' 3"  (1.6 m)  06/24/14 5' 3.5" (1.613 m)  03/04/14 5\' 3"  (1.6 m)    General appearance: alert, cooperative and appears stated age Head: Normocephalic, without obvious abnormality, atraumatic Neck: no adenopathy, supple, symmetrical, trachea midline and thyroid normal to inspection and palpation Lungs: clear to auscultation bilaterally Breasts: normal appearance, no masses or tenderness Heart: regular rate and rhythm Abdomen: soft, non-tender; no masses,  no organomegaly Extremities: extremities normal, atraumatic, no cyanosis or edema Skin: Skin color, texture, turgor normal. No rashes or lesions Lymph nodes: Cervical,  supraclavicular, and axillary nodes normal. No abnormal inguinal nodes palpated Neurologic: Grossly normal   Pelvic: External genitalia:  no lesions              Urethra:  normal appearing urethra with no masses, tenderness or lesions              Bartholin's and Skene's: normal                 Vagina: normal appearing vagina with normal color and discharge, no lesions              Cervix: anteverted              Pap taken: No. 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  Counseled on breast self exam, mammography screening, adequate intake of calcium and vitamin D, diet and exercise, Kegel's exercises return annually or prn  An After Visit Summary was printed and given to the patient.

## 2014-06-29 NOTE — Patient Instructions (Signed)

## 2014-07-01 NOTE — Progress Notes (Signed)
Note reviewed, agree with plan.  Orah Sonnen, MD  

## 2014-07-04 ENCOUNTER — Other Ambulatory Visit: Payer: Self-pay | Admitting: Orthopedic Surgery

## 2014-07-04 NOTE — H&P (Signed)
Diane Fox DOB: 1951-04-23 Married / Language: English / Race: White Female  Date of Admission:  07-05-2014  Chief Complaint:  Left Knee Pain  History of Present Illness The patient is a 63 year old female who comes in for a preoperative History and Physical. The patient is scheduled for a left total knee arthroplasty (versus left knee lateral release and medial plication) to be performed by Dr. Dione Plover. Aluisio, MD at [facility] on 07-05-2014.The patient is a 63 year old female presenting for a post-operative visit. Patient is many months postop following patellofemoral arthroplasty. Overall the patient feels that the left knee is not doing well. Post operative pain has been moderate. The patient does report pain The patient does indicate that these symptoms are worsening. Pain medications include: OTC meds; now taking more frequently . The patient is currently without the use of assistive devices. The patient is utilizing a home exercise program (also working with a Physiological scientist twice a week). The patient reports that therapy is not helping. She states her knee is definitely maltracking; she states you can visibly see it. Unfortunately she is still getting painful popping with extension. She is working with a Insurance underwriter and done therapy, but Unfortunately it is not helping. She is at a stage where she feels like she needs to do something about it now. She is scheduled for surgery now to undergo a lateral release and medial plication versus conversion of a full total knee arthroplasty. They have been treated conservatively in the past for the above stated problem and despite conservative measures, they continue to have progressive pain and severe functional limitations and dysfunction. They have failed non-operative management including home exercise, medications, and therapy. It is felt that they would benefit from undergoing knee surgery at this time. Risks and  benefits of the procedure have been discussed with the patient and they elect to proceed with surgery. There are no active contraindications to surgery such as ongoing infection or rapidly progressive neurological disease.  Allergies Sulfa Drugs. Hives. Penicillins. Hives.  Problem List/Past Medical History of arthroplasty of left knee (V43.65  [id number]). 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 Atypical Lobular Hyperplasia Menopause Oophorectomy. right Asthma   Family History Congestive Heart Failure. father Hypertension. mother and father Cancer. grandmother mothers side and grandfather mothers side Cerebrovascular Accident. First Degree Relatives. mother Heart Disease. father    Social History Post-Surgical Plans. Home Advance Directives. Living Will, Healthcare POA Alcohol use. current drinker; drinks beer, wine and hard liquor; only occasionally per week Pain Contract. no Tobacco / smoke exposure. no Current work status. working full time Children. 2 Drug/Alcohol Rehab (Currently). no Drug/Alcohol Rehab (Previously). no Tobacco use. Never smoker. never smoker Marital status. married Number of flights of stairs before winded. 1 Living situation. live with spouse Exercise. Exercises daily; does running / walking Illicit drug use. no    Medication History Tylenol Extra Strength (500MG  Tablet, Oral) Active. Maxzide (75-50MG  Tablet, Oral as needed) Active. Crestor (5MG  Tablet, Oral) Active. Lexapro (10MG  Tablet, Oral) Active. Centrum Silver ( Oral) Active.   Past Surgical History Dilation and Curettage of Uterus - Multiple Breast Biopsy. right Tubal Ligation. Date: 29. Wisdom Teeth Extraction. Date: 27. Hysteroscopy. Date: 2007. Colonoscopy    Vitals Height: 63 in Height was  reported by patient. Pulse: 64 (Regular) BP: 132/82 (Sitting, Right Arm, Standard)     Physical Exam The  physical exam findings are as follows:   General Mental Status - Alert, cooperative and good historian. General Appearance- pleasant. Not in acute distress. Orientation- Oriented X3. Build & Nutrition- Well nourished and Well developed.   Head and Neck Head- normocephalic, atraumatic . Neck Global Assessment- supple. no bruit auscultated on the right and no bruit auscultated on the left.   Eye Vision- Wears corrective lenses. Pupil- Bilateral- Regular and Round. Motion- Bilateral- EOMI.   Chest and Lung Exam Auscultation: Breath sounds:- clear at anterior chest wall and - clear at posterior chest wall. Adventitious sounds:- No Adventitious sounds.   Cardiovascular Auscultation:Rhythm- Regular rate and rhythm. Heart Sounds- S1 WNL and S2 WNL. Murmurs & Other Heart Sounds:Auscultation of the heart reveals - No Murmurs.   Abdomen Palpation/Percussion:Tenderness- Abdomen is non-tender to palpation. Rigidity (guarding)- Abdomen is soft. Auscultation:Auscultation of the abdomen reveals - Bowel sounds normal.   Female Genitourinary   Note: Not done, not pertinent to present illness  Musculoskeletal   Note: She is alert and oriented in no apparent distress. Evaluation of her left knee shows no effusion. Her range is about 0 to 130, but unfortunately when she goes into extension, the patella is subluxing laterally. This is all in terminal extension.  RADIOGRAPHS: Radiographs today show her prosthesis in excellent position. No periprosthetic abnormalities.   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 Wynelle Link.  Plan is to go home.  PCP - Dr. Hulan Fess - Patient has been seen preoperatively and felt to be stable for surgery.  The patient does not have any contraindications and will receive TXA (tranexamic acid) prior to surgery.  Signed electronically by Ok Edwards, III PA-C

## 2014-07-05 ENCOUNTER — Encounter (HOSPITAL_COMMUNITY): Payer: Self-pay | Admitting: *Deleted

## 2014-07-05 ENCOUNTER — Encounter (HOSPITAL_COMMUNITY)
Admission: RE | Disposition: A | Discharge status: Home Health | Payer: Self-pay | Source: Ambulatory Visit | Attending: Orthopedic Surgery

## 2014-07-05 ENCOUNTER — Inpatient Hospital Stay (HOSPITAL_COMMUNITY): Payer: 59 | Admitting: Certified Registered"

## 2014-07-05 ENCOUNTER — Encounter (HOSPITAL_COMMUNITY): Payer: 59 | Admitting: Certified Registered"

## 2014-07-05 ENCOUNTER — Inpatient Hospital Stay (HOSPITAL_COMMUNITY)
Admission: RE | Admit: 2014-07-05 | Discharge: 2014-07-07 | DRG: 470 | Disposition: A | Discharge status: Home Health | Payer: 59 | Source: Ambulatory Visit | Attending: Orthopedic Surgery | Admitting: Orthopedic Surgery

## 2014-07-05 DIAGNOSIS — M179 Osteoarthritis of knee, unspecified: Secondary | ICD-10-CM | POA: Diagnosis present

## 2014-07-05 DIAGNOSIS — E7800 Pure hypercholesterolemia, unspecified: Secondary | ICD-10-CM | POA: Diagnosis present

## 2014-07-05 DIAGNOSIS — J45909 Unspecified asthma, uncomplicated: Secondary | ICD-10-CM | POA: Diagnosis present

## 2014-07-05 DIAGNOSIS — M25569 Pain in unspecified knee: Secondary | ICD-10-CM | POA: Diagnosis present

## 2014-07-05 DIAGNOSIS — M1711 Unilateral primary osteoarthritis, right knee: Secondary | ICD-10-CM

## 2014-07-05 DIAGNOSIS — Z6831 Body mass index (BMI) 31.0-31.9, adult: Secondary | ICD-10-CM

## 2014-07-05 DIAGNOSIS — Z96659 Presence of unspecified artificial knee joint: Secondary | ICD-10-CM

## 2014-07-05 DIAGNOSIS — Z823 Family history of stroke: Secondary | ICD-10-CM

## 2014-07-05 DIAGNOSIS — Y831 Surgical operation with implant of artificial internal device as the cause of abnormal reaction of the patient, or of later complication, without mention of misadventure at the time of the procedure: Secondary | ICD-10-CM | POA: Diagnosis present

## 2014-07-05 DIAGNOSIS — M1710 Unilateral primary osteoarthritis, unspecified knee: Secondary | ICD-10-CM | POA: Diagnosis present

## 2014-07-05 DIAGNOSIS — T8489XA Other specified complication of internal orthopedic prosthetic devices, implants and grafts, initial encounter: Principal | ICD-10-CM | POA: Diagnosis present

## 2014-07-05 DIAGNOSIS — Z96651 Presence of right artificial knee joint: Secondary | ICD-10-CM

## 2014-07-05 HISTORY — PX: TOTAL KNEE ARTHROPLASTY: SHX125

## 2014-07-05 LAB — TYPE AND SCREEN
ABO/RH(D): A POS
ANTIBODY SCREEN: NEGATIVE

## 2014-07-05 SURGERY — ARTHROPLASTY, KNEE, TOTAL
Anesthesia: Spinal | Site: Knee | Laterality: Left

## 2014-07-05 MED ORDER — TRIAMTERENE-HCTZ 75-50 MG PO TABS
1.0000 | ORAL_TABLET | Freq: Every morning | ORAL | Status: DC
  Administered 2014-07-06 – 2014-07-07 (×2): 1 via ORAL
  Filled 2014-07-05 (×2): qty 1

## 2014-07-05 MED ORDER — DEXAMETHASONE SODIUM PHOSPHATE 10 MG/ML IJ SOLN
10.0000 mg | Freq: Once | INTRAMUSCULAR | Status: AC
  Administered 2014-07-05: 10 mg via INTRAVENOUS

## 2014-07-05 MED ORDER — METHOCARBAMOL 500 MG PO TABS
500.0000 mg | ORAL_TABLET | Freq: Four times a day (QID) | ORAL | Status: DC | PRN
  Administered 2014-07-06 – 2014-07-07 (×5): 500 mg via ORAL
  Filled 2014-07-05 (×5): qty 1

## 2014-07-05 MED ORDER — LACTATED RINGERS IV SOLN
INTRAVENOUS | Status: DC
  Administered 2014-07-05: 1000 mL via INTRAVENOUS

## 2014-07-05 MED ORDER — SODIUM CHLORIDE 0.9 % IJ SOLN
INTRAMUSCULAR | Status: DC | PRN
  Administered 2014-07-05: 30 mL

## 2014-07-05 MED ORDER — MIDAZOLAM HCL 2 MG/2ML IJ SOLN
INTRAMUSCULAR | Status: AC
  Filled 2014-07-05: qty 2

## 2014-07-05 MED ORDER — KCL IN DEXTROSE-NACL 20-5-0.9 MEQ/L-%-% IV SOLN
INTRAVENOUS | Status: DC
  Administered 2014-07-05: 19:00:00 via INTRAVENOUS
  Filled 2014-07-05 (×2): qty 1000

## 2014-07-05 MED ORDER — METOCLOPRAMIDE HCL 5 MG/ML IJ SOLN: 5.0000 mg | Freq: Three times a day (TID) | INTRAMUSCULAR | Status: DC | PRN

## 2014-07-05 MED ORDER — MORPHINE SULFATE 2 MG/ML IJ SOLN
1.0000 mg | INTRAMUSCULAR | Status: DC | PRN
  Administered 2014-07-05: 1 mg via INTRAVENOUS
  Filled 2014-07-05: qty 1

## 2014-07-05 MED ORDER — PHENOL 1.4 % MT LIQD: 1.0000 | OROMUCOSAL | Status: DC | PRN

## 2014-07-05 MED ORDER — PROPOFOL INFUSION 10 MG/ML OPTIME
INTRAVENOUS | Status: DC | PRN
  Administered 2014-07-05: 100 ug/kg/min via INTRAVENOUS

## 2014-07-05 MED ORDER — ACETAMINOPHEN 325 MG PO TABS: 650.0000 mg | ORAL_TABLET | Freq: Four times a day (QID) | ORAL | Status: DC | PRN

## 2014-07-05 MED ORDER — DEXAMETHASONE SODIUM PHOSPHATE 10 MG/ML IJ SOLN
INTRAMUSCULAR | Status: AC
  Filled 2014-07-05: qty 1

## 2014-07-05 MED ORDER — ONDANSETRON HCL 4 MG PO TABS: 4.0000 mg | ORAL_TABLET | Freq: Four times a day (QID) | ORAL | Status: DC | PRN

## 2014-07-05 MED ORDER — OXYCODONE HCL 5 MG PO TABS
5.0000 mg | ORAL_TABLET | ORAL | Status: DC | PRN
  Administered 2014-07-05 (×2): 5 mg via ORAL
  Administered 2014-07-05 – 2014-07-07 (×12): 10 mg via ORAL
  Filled 2014-07-05 (×5): qty 2
  Filled 2014-07-05: qty 1
  Filled 2014-07-05: qty 2
  Filled 2014-07-05: qty 1
  Filled 2014-07-05 (×6): qty 2

## 2014-07-05 MED ORDER — FLEET ENEMA 7-19 GM/118ML RE ENEM: 1.0000 | ENEMA | Freq: Once | RECTAL | Status: AC | PRN

## 2014-07-05 MED ORDER — SODIUM CHLORIDE 0.9 % IV SOLN: INTRAVENOUS | Status: DC

## 2014-07-05 MED ORDER — DEXAMETHASONE SODIUM PHOSPHATE 10 MG/ML IJ SOLN
10.0000 mg | Freq: Every day | INTRAMUSCULAR | Status: AC
  Filled 2014-07-05: qty 1

## 2014-07-05 MED ORDER — METOCLOPRAMIDE HCL 10 MG PO TABS: 5.0000 mg | ORAL_TABLET | Freq: Three times a day (TID) | ORAL | Status: DC | PRN

## 2014-07-05 MED ORDER — LACTATED RINGERS IV SOLN
INTRAVENOUS | Status: DC | PRN
  Administered 2014-07-05: 14:00:00 via INTRAVENOUS

## 2014-07-05 MED ORDER — DEXAMETHASONE 6 MG PO TABS
10.0000 mg | ORAL_TABLET | Freq: Every day | ORAL | Status: AC
  Administered 2014-07-06: 10 mg via ORAL
  Filled 2014-07-05: qty 1

## 2014-07-05 MED ORDER — RIVAROXABAN 10 MG PO TABS
10.0000 mg | ORAL_TABLET | Freq: Every day | ORAL | Status: DC
  Administered 2014-07-06 – 2014-07-07 (×2): 10 mg via ORAL
  Filled 2014-07-05 (×3): qty 1

## 2014-07-05 MED ORDER — BISACODYL 10 MG RE SUPP: 10.0000 mg | Freq: Every day | RECTAL | Status: DC | PRN

## 2014-07-05 MED ORDER — BUPIVACAINE HCL (PF) 0.25 % IJ SOLN
INTRAMUSCULAR | Status: AC
  Filled 2014-07-05: qty 30

## 2014-07-05 MED ORDER — PROPOFOL 10 MG/ML IV BOLUS
INTRAVENOUS | Status: DC | PRN
  Administered 2014-07-05 (×2): 20 mg via INTRAVENOUS

## 2014-07-05 MED ORDER — METHOCARBAMOL 1000 MG/10ML IJ SOLN
500.0000 mg | Freq: Four times a day (QID) | INTRAVENOUS | Status: DC | PRN
  Administered 2014-07-05: 500 mg via INTRAVENOUS
  Filled 2014-07-05: qty 5

## 2014-07-05 MED ORDER — KCL IN DEXTROSE-NACL 20-5-0.45 MEQ/L-%-% IV SOLN
INTRAVENOUS | Status: AC
  Administered 2014-07-05: 1000 mL
  Filled 2014-07-05: qty 1000

## 2014-07-05 MED ORDER — ACETAMINOPHEN 650 MG RE SUPP: 650.0000 mg | Freq: Four times a day (QID) | RECTAL | Status: DC | PRN

## 2014-07-05 MED ORDER — PROPOFOL 10 MG/ML IV BOLUS
INTRAVENOUS | Status: AC
  Filled 2014-07-05: qty 20

## 2014-07-05 MED ORDER — VANCOMYCIN HCL IN DEXTROSE 1-5 GM/200ML-% IV SOLN
1000.0000 mg | INTRAVENOUS | Status: AC
  Administered 2014-07-05: 1000 mg via INTRAVENOUS

## 2014-07-05 MED ORDER — MIDAZOLAM HCL 5 MG/5ML IJ SOLN
INTRAMUSCULAR | Status: DC | PRN
  Administered 2014-07-05 (×2): 1 mg via INTRAVENOUS

## 2014-07-05 MED ORDER — DOCUSATE SODIUM 100 MG PO CAPS
100.0000 mg | ORAL_CAPSULE | Freq: Two times a day (BID) | ORAL | Status: DC
  Administered 2014-07-05 – 2014-07-07 (×4): 100 mg via ORAL

## 2014-07-05 MED ORDER — LORATADINE 10 MG PO TABS
10.0000 mg | ORAL_TABLET | Freq: Every day | ORAL | Status: DC
  Administered 2014-07-06 – 2014-07-07 (×2): 10 mg via ORAL
  Filled 2014-07-05 (×2): qty 1

## 2014-07-05 MED ORDER — ONDANSETRON HCL 4 MG/2ML IJ SOLN: 4.0000 mg | Freq: Four times a day (QID) | INTRAMUSCULAR | Status: DC | PRN

## 2014-07-05 MED ORDER — BUPIVACAINE IN DEXTROSE 0.75-8.25 % IT SOLN
INTRATHECAL | Status: DC | PRN
  Administered 2014-07-05: 2 mL via INTRATHECAL

## 2014-07-05 MED ORDER — ACETAMINOPHEN 10 MG/ML IV SOLN
1000.0000 mg | Freq: Once | INTRAVENOUS | Status: AC
  Administered 2014-07-05: 1000 mg via INTRAVENOUS
  Filled 2014-07-05: qty 100

## 2014-07-05 MED ORDER — FENTANYL CITRATE 0.05 MG/ML IJ SOLN
INTRAMUSCULAR | Status: AC
  Filled 2014-07-05: qty 2

## 2014-07-05 MED ORDER — FENTANYL CITRATE 0.05 MG/ML IJ SOLN
INTRAMUSCULAR | Status: DC | PRN
  Administered 2014-07-05 (×2): 50 ug via INTRAVENOUS

## 2014-07-05 MED ORDER — BUPIVACAINE HCL 0.25 % IJ SOLN
INTRAMUSCULAR | Status: DC | PRN
  Administered 2014-07-05: 20 mL

## 2014-07-05 MED ORDER — ONDANSETRON HCL 4 MG/2ML IJ SOLN
INTRAMUSCULAR | Status: AC
  Filled 2014-07-05: qty 2

## 2014-07-05 MED ORDER — ONDANSETRON HCL 4 MG/2ML IJ SOLN
INTRAMUSCULAR | Status: DC | PRN
  Administered 2014-07-05: 4 mg via INTRAVENOUS

## 2014-07-05 MED ORDER — VANCOMYCIN HCL IN DEXTROSE 1-5 GM/200ML-% IV SOLN
INTRAVENOUS | Status: AC
  Filled 2014-07-05: qty 200

## 2014-07-05 MED ORDER — 0.9 % SODIUM CHLORIDE (POUR BTL) OPTIME
TOPICAL | Status: DC | PRN
  Administered 2014-07-05: 1000 mL

## 2014-07-05 MED ORDER — BUPIVACAINE LIPOSOME 1.3 % IJ SUSP
20.0000 mL | Freq: Once | INTRAMUSCULAR | Status: DC
  Filled 2014-07-05 (×2): qty 20

## 2014-07-05 MED ORDER — KETOROLAC TROMETHAMINE 15 MG/ML IJ SOLN
7.5000 mg | Freq: Four times a day (QID) | INTRAMUSCULAR | Status: AC | PRN
  Administered 2014-07-05: 7.5 mg via INTRAVENOUS
  Filled 2014-07-05: qty 1

## 2014-07-05 MED ORDER — MENTHOL 3 MG MT LOZG: 1.0000 | LOZENGE | OROMUCOSAL | Status: DC | PRN

## 2014-07-05 MED ORDER — ACETAMINOPHEN 500 MG PO TABS
1000.0000 mg | ORAL_TABLET | Freq: Four times a day (QID) | ORAL | Status: AC
  Administered 2014-07-05 – 2014-07-06 (×4): 1000 mg via ORAL
  Filled 2014-07-05 (×5): qty 2

## 2014-07-05 MED ORDER — STERILE WATER FOR IRRIGATION IR SOLN
Status: DC | PRN
  Administered 2014-07-05: 1500 mL

## 2014-07-05 MED ORDER — SODIUM CHLORIDE 0.9 % IJ SOLN
INTRAMUSCULAR | Status: AC
  Filled 2014-07-05: qty 50

## 2014-07-05 MED ORDER — ESCITALOPRAM OXALATE 10 MG PO TABS
10.0000 mg | ORAL_TABLET | Freq: Every day | ORAL | Status: DC
  Administered 2014-07-05 – 2014-07-06 (×2): 10 mg via ORAL
  Filled 2014-07-05 (×3): qty 1

## 2014-07-05 MED ORDER — VANCOMYCIN HCL IN DEXTROSE 1-5 GM/200ML-% IV SOLN
1000.0000 mg | Freq: Two times a day (BID) | INTRAVENOUS | Status: AC
  Administered 2014-07-06: 1000 mg via INTRAVENOUS
  Filled 2014-07-05: qty 200

## 2014-07-05 MED ORDER — LACTATED RINGERS IV SOLN: INTRAVENOUS | Status: DC

## 2014-07-05 MED ORDER — TRANEXAMIC ACID 100 MG/ML IV SOLN
1000.0000 mg | INTRAVENOUS | Status: AC
  Administered 2014-07-05: 1000 mg via INTRAVENOUS
  Filled 2014-07-05: qty 10

## 2014-07-05 MED ORDER — DIPHENHYDRAMINE HCL 12.5 MG/5ML PO ELIX: 12.5000 mg | ORAL_SOLUTION | ORAL | Status: DC | PRN

## 2014-07-05 MED ORDER — BUPIVACAINE LIPOSOME 1.3 % IJ SUSP
INTRAMUSCULAR | Status: DC | PRN
  Administered 2014-07-05: 20 mL

## 2014-07-05 MED ORDER — SODIUM CHLORIDE 0.9 % IR SOLN
Status: DC | PRN
  Administered 2014-07-05: 1000 mL

## 2014-07-05 MED ORDER — POLYETHYLENE GLYCOL 3350 17 G PO PACK
17.0000 g | PACK | Freq: Every day | ORAL | Status: DC | PRN
  Administered 2014-07-06: 17 g via ORAL

## 2014-07-05 SURGICAL SUPPLY — 58 items
BAG ZIPLOCK 12X15 (MISCELLANEOUS) ×2 IMPLANT
BANDAGE ELASTIC 6 VELCRO ST LF (GAUZE/BANDAGES/DRESSINGS) ×2 IMPLANT
BANDAGE ESMARK 6X9 LF (GAUZE/BANDAGES/DRESSINGS) ×1 IMPLANT
BLADE SAG 18X100X1.27 (BLADE) ×2 IMPLANT
BLADE SAW SGTL 11.0X1.19X90.0M (BLADE) ×2 IMPLANT
BNDG ESMARK 6X9 LF (GAUZE/BANDAGES/DRESSINGS) ×2
BOWL SMART MIX CTS (DISPOSABLE) ×2 IMPLANT
CAP KNEE ATTUNE RP ×2 IMPLANT
CEMENT HV SMART SET (Cement) ×4 IMPLANT
CUFF TOURN SGL QUICK 34 (TOURNIQUET CUFF) ×1
CUFF TRNQT CYL 34X4X40X1 (TOURNIQUET CUFF) ×1 IMPLANT
DECANTER SPIKE VIAL GLASS SM (MISCELLANEOUS) ×2 IMPLANT
DRAPE EXTREMITY T 121X128X90 (DRAPE) ×2 IMPLANT
DRAPE POUCH INSTRU U-SHP 10X18 (DRAPES) ×2 IMPLANT
DRAPE U-SHAPE 47X51 STRL (DRAPES) ×2 IMPLANT
DRSG ADAPTIC 3X8 NADH LF (GAUZE/BANDAGES/DRESSINGS) ×2 IMPLANT
DRSG PAD ABDOMINAL 8X10 ST (GAUZE/BANDAGES/DRESSINGS) ×2 IMPLANT
DURAPREP 26ML APPLICATOR (WOUND CARE) ×2 IMPLANT
ELECT REM PT RETURN 9FT ADLT (ELECTROSURGICAL) ×2
ELECTRODE REM PT RTRN 9FT ADLT (ELECTROSURGICAL) ×1 IMPLANT
EVACUATOR 1/8 PVC DRAIN (DRAIN) ×2 IMPLANT
FACESHIELD WRAPAROUND (MASK) ×10 IMPLANT
GAUZE SPONGE 4X4 12PLY STRL (GAUZE/BANDAGES/DRESSINGS) ×2 IMPLANT
GLOVE BIO SURGEON STRL SZ7.5 (GLOVE) ×2 IMPLANT
GLOVE BIO SURGEON STRL SZ8 (GLOVE) ×2 IMPLANT
GLOVE BIOGEL PI IND STRL 6.5 (GLOVE) IMPLANT
GLOVE BIOGEL PI IND STRL 8 (GLOVE) ×2 IMPLANT
GLOVE BIOGEL PI INDICATOR 6.5 (GLOVE)
GLOVE BIOGEL PI INDICATOR 8 (GLOVE) ×2
GLOVE SURG SS PI 6.5 STRL IVOR (GLOVE) IMPLANT
GOWN STRL REUS W/TWL LRG LVL3 (GOWN DISPOSABLE) ×2 IMPLANT
GOWN STRL REUS W/TWL XL LVL3 (GOWN DISPOSABLE) IMPLANT
HANDPIECE INTERPULSE COAX TIP (DISPOSABLE) ×1
IMMOBILIZER KNEE 20 (SOFTGOODS) ×2
IMMOBILIZER KNEE 20 THIGH 36 (SOFTGOODS) ×1 IMPLANT
KIT BASIN OR (CUSTOM PROCEDURE TRAY) ×2 IMPLANT
MANIFOLD NEPTUNE II (INSTRUMENTS) ×2 IMPLANT
NDL SAFETY ECLIPSE 18X1.5 (NEEDLE) ×2 IMPLANT
NEEDLE HYPO 18GX1.5 SHARP (NEEDLE) ×2
NS IRRIG 1000ML POUR BTL (IV SOLUTION) ×2 IMPLANT
PACK TOTAL JOINT (CUSTOM PROCEDURE TRAY) ×2 IMPLANT
PADDING CAST COTTON 6X4 STRL (CAST SUPPLIES) ×4 IMPLANT
POSITIONER SURGICAL ARM (MISCELLANEOUS) ×2 IMPLANT
SET HNDPC FAN SPRY TIP SCT (DISPOSABLE) ×1 IMPLANT
SPONGE GAUZE 4X4 12PLY (GAUZE/BANDAGES/DRESSINGS) ×2 IMPLANT
STRIP CLOSURE SKIN 1/2X4 (GAUZE/BANDAGES/DRESSINGS) ×4 IMPLANT
SUCTION FRAZIER 12FR DISP (SUCTIONS) ×2 IMPLANT
SUT MNCRL AB 4-0 PS2 18 (SUTURE) ×2 IMPLANT
SUT VIC AB 2-0 CT1 27 (SUTURE) ×3
SUT VIC AB 2-0 CT1 TAPERPNT 27 (SUTURE) ×3 IMPLANT
SUT VLOC 180 0 24IN GS25 (SUTURE) ×2 IMPLANT
SYRINGE 20CC LL (MISCELLANEOUS) ×2 IMPLANT
SYRINGE 60CC LL (MISCELLANEOUS) ×2 IMPLANT
TOWEL OR 17X26 10 PK STRL BLUE (TOWEL DISPOSABLE) ×2 IMPLANT
TOWEL OR NON WOVEN STRL DISP B (DISPOSABLE) IMPLANT
TRAY FOLEY CATH 14FRSI W/METER (CATHETERS) ×2 IMPLANT
WATER STERILE IRR 1500ML POUR (IV SOLUTION) ×2 IMPLANT
WRAP KNEE MAXI GEL POST OP (GAUZE/BANDAGES/DRESSINGS) ×2 IMPLANT

## 2014-07-05 NOTE — Anesthesia Postprocedure Evaluation (Signed)
  Anesthesia Post-op Note  Patient: Diane Fox  Procedure(s) Performed: Procedure(s) (LRB): LEFT TOTAL KNEE ARTHROPLASTY (Left)  Patient Location: PACU  Anesthesia Type: Spinal  Level of Consciousness: awake and alert   Airway and Oxygen Therapy: Patient Spontanous Breathing  Post-op Pain: mild  Post-op Assessment: Post-op Vital signs reviewed, Patient's Cardiovascular Status Stable, Respiratory Function Stable, Patent Airway and No signs of Nausea or vomiting  Last Vitals:  Filed Vitals:   07/05/14 1820  BP: 115/60  Pulse: 66  Temp: 36.5 C  Resp: 18    Post-op Vital Signs: stable   Complications: No apparent anesthesia complications

## 2014-07-05 NOTE — H&P (View-Only) (Signed)
Diane Fox DOB: 02-20-1951 Married / Language: English / Race: White Female  Date of Admission:  07-05-2014  Chief Complaint:  Left Knee Pain  History of Present Illness The patient is a 63 year old female who comes in for a preoperative History and Physical. The patient is scheduled for a left total knee arthroplasty (versus left knee lateral release and medial plication) to be performed by Dr. Dione Plover. Aluisio, MD at [facility] on 07-05-2014.The patient is a 63 year old female presenting for a post-operative visit. Patient is many months postop following patellofemoral arthroplasty. Overall the patient feels that the left knee is not doing well. Post operative pain has been moderate. The patient does report pain The patient does indicate that these symptoms are worsening. Pain medications include: OTC meds; now taking more frequently . The patient is currently without the use of assistive devices. The patient is utilizing a home exercise program (also working with a Physiological scientist twice a week). The patient reports that therapy is not helping. She states her knee is definitely maltracking; she states you can visibly see it. Unfortunately she is still getting painful popping with extension. She is working with a Insurance underwriter and done therapy, but Unfortunately it is not helping. She is at a stage where she feels like she needs to do something about it now. She is scheduled for surgery now to undergo a lateral release and medial plication versus conversion of a full total knee arthroplasty. They have been treated conservatively in the past for the above stated problem and despite conservative measures, they continue to have progressive pain and severe functional limitations and dysfunction. They have failed non-operative management including home exercise, medications, and therapy. It is felt that they would benefit from undergoing knee surgery at this time. Risks and  benefits of the procedure have been discussed with the patient and they elect to proceed with surgery. There are no active contraindications to surgery such as ongoing infection or rapidly progressive neurological disease.  Allergies Sulfa Drugs. Hives. Penicillins. Hives.  Problem List/Past Medical History of arthroplasty of left knee (V43.65  T7103179). 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 Atypical Lobular Hyperplasia Menopause Oophorectomy. right Asthma   Family History Congestive Heart Failure. father Hypertension. mother and father Cancer. grandmother mothers side and grandfather mothers side Cerebrovascular Accident. First Degree Relatives. mother Heart Disease. father    Social History Post-Surgical Plans. Home Advance Directives. Living Will, Healthcare POA Alcohol use. current drinker; drinks beer, wine and hard liquor; only occasionally per week Pain Contract. no Tobacco / smoke exposure. no Current work status. working full time Children. 2 Drug/Alcohol Rehab (Currently). no Drug/Alcohol Rehab (Previously). no Tobacco use. Never smoker. never smoker Marital status. married Number of flights of stairs before winded. 1 Living situation. live with spouse Exercise. Exercises daily; does running / walking Illicit drug use. no    Medication History Tylenol Extra Strength (500MG  Tablet, Oral) Active. Maxzide (75-50MG  Tablet, Oral as needed) Active. Crestor (5MG  Tablet, Oral) Active. Lexapro (10MG  Tablet, Oral) Active. Centrum Silver ( Oral) Active.   Past Surgical History Dilation and Curettage of Uterus - Multiple Breast Biopsy. right Tubal Ligation. Date: 39. Wisdom Teeth Extraction. Date: 55. Hysteroscopy. Date: 2007. Colonoscopy    Vitals Height: 63 in Height was  reported by patient. Pulse: 64 (Regular) BP: 132/82 (Sitting, Right Arm, Standard)     Physical Exam The  physical exam findings are as follows:   General Mental Status - Alert, cooperative and good historian. General Appearance- pleasant. Not in acute distress. Orientation- Oriented X3. Build & Nutrition- Well nourished and Well developed.   Head and Neck Head- normocephalic, atraumatic . Neck Global Assessment- supple. no bruit auscultated on the right and no bruit auscultated on the left.   Eye Vision- Wears corrective lenses. Pupil- Bilateral- Regular and Round. Motion- Bilateral- EOMI.   Chest and Lung Exam Auscultation: Breath sounds:- clear at anterior chest wall and - clear at posterior chest wall. Adventitious sounds:- No Adventitious sounds.   Cardiovascular Auscultation:Rhythm- Regular rate and rhythm. Heart Sounds- S1 WNL and S2 WNL. Murmurs & Other Heart Sounds:Auscultation of the heart reveals - No Murmurs.   Abdomen Palpation/Percussion:Tenderness- Abdomen is non-tender to palpation. Rigidity (guarding)- Abdomen is soft. Auscultation:Auscultation of the abdomen reveals - Bowel sounds normal.   Female Genitourinary   Note: Not done, not pertinent to present illness  Musculoskeletal   Note: She is alert and oriented in no apparent distress. Evaluation of her left knee shows no effusion. Her range is about 0 to 130, but unfortunately when she goes into extension, the patella is subluxing laterally. This is all in terminal extension.  RADIOGRAPHS: Radiographs today show her prosthesis in excellent position. No periprosthetic abnormalities.   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 Wynelle Link.  Plan is to go home.  PCP - Dr. Hulan Fess - Patient has been seen preoperatively and felt to be stable for surgery.  The patient does not have any contraindications and will receive TXA (tranexamic acid) prior to surgery.  Signed electronically by Ok Edwards, III PA-C

## 2014-07-05 NOTE — Interval H&P Note (Signed)
History and Physical Interval Note:  07/05/2014 2:38 PM  Diane Fox  has presented today for surgery, with the diagnosis of maltracking patella possible patella femoral arthroplasty on the left  The various methods of treatment have been discussed with the patient and family. After consideration of risks, benefits and other options for treatment, the patient has consented to  Procedure(s): LEFT KNEE LATERAL RELEASE, MEDIAL PLICATION VS LEFT TOTAL KNEE ARTHROPLASTY (Left) as a surgical intervention .  The patient's history has been reviewed, patient examined, no change in status, stable for surgery.  I have reviewed the patient's chart and labs.  Questions were answered to the patient's satisfaction.     Gearlean Alf

## 2014-07-05 NOTE — Brief Op Note (Signed)
07/05/2014  4:04 PM  PATIENT:  Diane Fox  63 y.o. 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  PROCEDURE:  Procedure(s): LEFT TOTAL KNEE ARTHROPLASTY (Left) ( Attune system)  SURGEON:  Surgeon(s) and Role:    * Gearlean Alf, MD - Primary  PHYSICIAN ASSISTANT:   ASSISTANTS: [name], PA-C   ANESTHESIA:   spinal  EBL:  Total I/O In: -  Out: 225 [Urine:200; Blood:25]  BLOOD ADMINISTERED:none  DRAINS: (Medium) Hemovact drain(s) in the left with  Suction Open   LOCAL MEDICATIONS USED:  OTHER Exparel  COUNTS:  YES  TOURNIQUET:   Total Tourniquet Time Documented: Thigh (Left) - 45 minutes Total: Thigh (Left) - 45 minutes   DICTATION: .Other Dictation: Dictation Number [id number]  PLAN OF CARE: Admit to inpatient   PATIENT DISPOSITION:  PACU - hemodynamically stable.

## 2014-07-05 NOTE — Anesthesia Procedure Notes (Signed)
Spinal  Patient location during procedure: OR Start time: 07/05/2014 2:46 PM End time: 07/05/2014 2:52 PM Staffing Anesthesiologist: DENENNY, BRUCE J CRNA/Resident: EARGLE, BETH E Performed by: resident/CRNA  Preanesthetic Checklist Completed: patient identified, site marked, surgical consent, pre-op evaluation, timeout performed, IV checked, risks and benefits discussed and monitors and equipment checked Spinal Block Patient position: sitting Prep: Betadine Patient monitoring: heart rate, continuous pulse ox and blood pressure Approach: right paramedian Location: L3-4 Injection technique: single-shot Needle Needle type: Sprotte  Needle gauge: 24 G Needle length: 10 cm Additional Notes Kit expiration checked (11/2015), sitting position, attempt midline x1 w/o success, first pass paramedian with clear CSF, neg heme, neg paresthesia. Tol well, return to supine   

## 2014-07-05 NOTE — Anesthesia Preprocedure Evaluation (Addendum)
Anesthesia Evaluation  Patient identified by MRN, date of birth, ID band Patient awake    Reviewed: Allergy & Precautions, H&P , NPO status , Patient's Chart, lab work & pertinent test results  Airway Mallampati: II TM Distance: >3 FB Neck ROM: Full    Dental no notable dental hx.    Pulmonary asthma ,  breath sounds clear to auscultation  Pulmonary exam normal       Cardiovascular negative cardio ROS  Rhythm:Regular Rate:Normal     Neuro/Psych negative neurological ROS  negative psych ROS   GI/Hepatic negative GI ROS, Neg liver ROS,   Endo/Other  negative endocrine ROS  Renal/GU negative Renal ROS  negative genitourinary   Musculoskeletal negative musculoskeletal ROS (+)   Abdominal (+) + obese,   Peds negative pediatric ROS (+)  Hematology negative hematology ROS (+)   Anesthesia Other Findings   Reproductive/Obstetrics negative OB ROS                           Anesthesia Physical Anesthesia Plan  ASA: II  Anesthesia Plan: Spinal   Post-op Pain Management:    Induction: Intravenous  Airway Management Planned:   Additional Equipment:   Intra-op Plan:   Post-operative Plan:   Informed Consent: I have reviewed the patients History and Physical, chart, labs and discussed the procedure including the risks, benefits and alternatives for the proposed anesthesia with the patient or authorized representative who has indicated his/her understanding and acceptance.   Dental advisory given  Plan Discussed with: CRNA  Anesthesia Plan Comments: (Discussed general and spinal. Discussed risks/benefits of spinal including headache, backache, failure, bleeding, infection, and nerve damage. Patient consents to spinal. Questions answered. Coagulation studies and platelet count acceptable.)       Anesthesia Quick Evaluation

## 2014-07-05 NOTE — Transfer of Care (Signed)
Immediate Anesthesia Transfer of Care Note  Patient: Diane Fox  Procedure(s) Performed: Procedure(s): LEFT TOTAL KNEE ARTHROPLASTY (Left)  Patient Location: PACU  Anesthesia Type:Spinal  Level of Consciousness: awake, alert , oriented and patient cooperative  Airway & Oxygen Therapy: Patient Spontanous Breathing and Patient connected to nasal cannula oxygen  Post-op Assessment: Report given to PACU RN and Post -op Vital signs reviewed and stable  Post vital signs: Reviewed and stable  Complications: No apparent anesthesia complications

## 2014-07-06 ENCOUNTER — Encounter (HOSPITAL_COMMUNITY): Payer: Self-pay | Admitting: Orthopedic Surgery

## 2014-07-06 LAB — CBC
HCT: 38.3 % (ref 36.0–46.0)
Hemoglobin: 13 g/dL (ref 12.0–15.0)
MCH: 29.8 pg (ref 26.0–34.0)
MCHC: 33.9 g/dL (ref 30.0–36.0)
MCV: 87.8 fL (ref 78.0–100.0)
PLATELETS: 300 10*3/uL (ref 150–400)
RBC: 4.36 MIL/uL (ref 3.87–5.11)
RDW: 12.8 % (ref 11.5–15.5)
WBC: 9.9 10*3/uL (ref 4.0–10.5)

## 2014-07-06 LAB — BASIC METABOLIC PANEL
Anion gap: 12 (ref 5–15)
BUN: 16 mg/dL (ref 6–23)
CO2: 26 mEq/L (ref 19–32)
CREATININE: 0.85 mg/dL (ref 0.50–1.10)
Calcium: 8.6 mg/dL (ref 8.4–10.5)
Chloride: 100 mEq/L (ref 96–112)
GFR, EST AFRICAN AMERICAN: 83 mL/min — AB (ref >90)
GFR, EST NON AFRICAN AMERICAN: 71 mL/min — AB (ref >90)
Glucose, Bld: 171 mg/dL — ABNORMAL HIGH (ref 70–99)
Potassium: 3.7 mEq/L (ref 3.7–5.3)
Sodium: 138 mEq/L (ref 137–147)

## 2014-07-06 NOTE — Progress Notes (Signed)
Physical Therapy Treatment Note   07/06/14 1500  PT Visit Information  Last PT Received On 07/06/14  Assistance Needed +1  History of Present Illness 63 year old female s/p L TKA due to maltracking post left patellofemoral arthoplasty.  PT Time Calculation  PT Start Time 1316  PT Stop Time 1343  PT Time Calculation (min) 27 min  Subjective Data  Subjective Pt ambulated again in hallway and performed LE exercises.  Pt mobilizing well and anticipates d/c home tomorrow.  Precautions  Precautions Knee  Precaution Comments able to perform SLR  Required Braces or Orthoses Knee Immobilizer - Left  Knee Immobilizer - Left Discontinue once straight leg raise with < 10 degree lag  Restrictions  Other Position/Activity Restrictions WBAT  Cognition  Arousal/Alertness Awake/alert  Behavior During Therapy WFL for tasks assessed/performed  Overall Cognitive Status Within Functional Limits for tasks assessed  Bed Mobility  Overal bed mobility Modified Independent  Transfers  Overall transfer level Modified independent  General transfer comment performed transfers safely  Ambulation/Gait  Ambulation/Gait assistance Supervision  Ambulation Distance (Feet) 120 Feet  Assistive device Rolling walker (2 wheeled)  Gait Pattern/deviations Step-through pattern;Decreased stance time - left;Decreased step length - right;Antalgic  General Gait Details verbal cues for heel strike  Exercises  Exercises Total Joint  Total Joint Exercises  Ankle Circles/Pumps AROM;Both;15 reps  Quad Sets AROM;Left;15 reps  Short Arc Quad AROM;Left;15 reps  Heel Slides AAROM;Left;15 reps;Seated;Supine  Hip ABduction/ADduction AROM;Left;15 reps  Straight Leg Raises 10 reps;Left;AROM  PT - End of Session  Activity Tolerance Patient tolerated treatment well  Patient left in bed;with call bell/phone within reach  PT - Assessment/Plan  PT Plan Current plan remains appropriate  PT Frequency 7X/week  Follow Up  Recommendations Home health PT  PT equipment None recommended by PT  PT Goal Progression  Progress towards PT goals Progressing toward goals  PT General Charges  $$ ACUTE PT VISIT 1 Procedure  PT Treatments  $Gait Training 8-22 mins  $Therapeutic Exercise 8-22 mins   Carmelia Bake, PT, DPT 07/06/2014 Pager: [phone]

## 2014-07-06 NOTE — Progress Notes (Signed)
Came to visit patient at bedside on behalf of [facility] Care Management program. She reports she is familiar with the program and does not have any needs nor does she desire post hospital discharge call. Left contact information at bedside. She reports she still has brochure from last visit back in December. Made inpatient RNCM aware of visit.  Marthenia Rolling, MSN- RN, [facility] [phone]

## 2014-07-06 NOTE — Progress Notes (Signed)
CARE MANAGEMENT NOTE 07/06/2014  Patient:  LAYTON, TAPPAN   Account Number:  [id number]  Date Initiated:  07/06/2014  Documentation initiated by:  Charise Leinbach  Subjective/Objective Assessment:   pt with revision of total knee     Action/Plan:   home with hhc/request advanced  and has dme at home   Anticipated DC Date:  07/09/2014   Anticipated DC Plan:  [place] referral  NA      [place]  CM consult      [facility] Choice  NA   Choice offered to / List presented to:  C-1 Patient   DME arranged  NA      DME agency  NA     [place] arranged  [place].   Status of service:  In process, will continue to follow Medicare Important Message given?  NA - LOS <3 / Initial given by admissions (If response is "NO", the following Medicare IM given date fields will be blank) Date Medicare IM given:   Medicare IM given by:   Date Additional Medicare IM given:   Additional Medicare IM given by:    Discharge Disposition:    Per UR Regulation:  Reviewed for med. necessity/level of care/duration of stay  If discussed at [place] of Stay Meetings, dates discussed:    Comments:  [id number]/Talvin Christianson Victorio Palm: [phone] Chart review for needs and updates. No discharge needs present at time of review. Next review due [id number].

## 2014-07-06 NOTE — Evaluation (Signed)
Physical Therapy Evaluation Patient Details Name: Diane Fox MRN: [id number] DOB: 1951/06/20 Today's Date: 07/06/2014   History of Present Illness  63 year old female s/p L TKA due to maltracking post left patellofemoral arthoplasty.  Clinical Impression  Pt is s/p L TKA resulting in the deficits listed below (see PT Problem List).  Pt will benefit from skilled PT to increase their independence and safety with mobility to allow discharge to the venue listed below.  Pt performing mobility well POD #1 and assisted to bathroom then ambulated in hallway.  Pt plans to d/c home with spouse.     Follow Up Recommendations Home health PT    Equipment Recommendations  None recommended by PT    Recommendations for Other Services       Precautions / Restrictions Precautions Required Braces or Orthoses: Knee Immobilizer - Left Knee Immobilizer - Left: Discontinue once straight leg raise with < 10 degree lag Restrictions Other Position/Activity Restrictions: WBAT      Mobility  Bed Mobility Overal bed mobility: Needs Assistance Bed Mobility: Supine to Sit     Supine to sit: Supervision     General bed mobility comments: verbal cues for self assist  Transfers Overall transfer level: Needs assistance Equipment used: Rolling walker (2 wheeled) Transfers: Sit to/from Stand Sit to Stand: Min guard         General transfer comment: verbal cues for safe technique  Ambulation/Gait Ambulation/Gait assistance: Min guard Ambulation Distance (Feet): 80 Feet Assistive device: Rolling walker (2 wheeled) Gait Pattern/deviations: Step-to pattern;Antalgic     General Gait Details: verbal cues for sequence, RW positioning, step length  Stairs            Wheelchair Mobility    Modified Rankin (Stroke Patients Only)       Balance                                             Pertinent Vitals/Pain Premedicated, activity to tolerance, ice packs applied     Home Living Family/patient expects to be discharged to:: Private residence Living Arrangements: Spouse/significant other   Type of Home: House Home Access: Stairs to enter Entrance Stairs-Rails: None Entrance Stairs-Number of Steps: 2 Home Layout: Able to live on main level with bedroom/bathroom Home Equipment: Walker - 2 wheels;Bedside commode      Prior Function Level of Independence: Independent               Hand Dominance        Extremity/Trunk Assessment               Lower Extremity Assessment: LLE deficits/detail   LLE Deficits / Details: unable to perform SLR, maintained KI     Communication   Communication: No difficulties  Cognition Arousal/Alertness: Awake/alert Behavior During Therapy: WFL for tasks assessed/performed Overall Cognitive Status: Within Functional Limits for tasks assessed                      General Comments      Exercises        Assessment/Plan    PT Assessment Patient needs continued PT services  PT Diagnosis Acute pain;Difficulty walking   PT Problem List Decreased strength;Decreased range of motion;Pain;Decreased mobility  PT Treatment Interventions Functional mobility training;Stair training;Gait training;DME instruction;Patient/family education;Therapeutic activities;Therapeutic exercise   PT Goals (Current goals can be found  in the Care Plan section) Acute Rehab PT Goals PT Goal Formulation: With patient Time For Goal Achievement: 07/09/14 Potential to Achieve Goals: Good    Frequency 7X/week   Barriers to discharge        Co-evaluation               End of Session Equipment Utilized During Treatment: Left knee immobilizer Activity Tolerance: Patient tolerated treatment well Patient left: in chair;with call bell/phone within reach           Time: 0937-0955 PT Time Calculation (min): 18 min   Charges:   PT Evaluation $Initial PT Evaluation Tier I: 1 Procedure PT  Treatments $Gait Training: 8-22 mins   PT G Codes:          Lezli Danek,KATHrine E 07/06/2014, 11:53 AM Carmelia Bake, PT, DPT 07/06/2014 Pager: [phone]

## 2014-07-06 NOTE — Op Note (Signed)
NAMEWILSIE, KERN NO.:  [id number]  MEDICAL RECORD NO.:  [id number]  LOCATION:  48                         FACILITY:  [facility]  PHYSICIAN:  Gaynelle Arabian, M.D.    DATE OF BIRTH:  07-Apr-1951  DATE OF PROCEDURE:  07/05/2014 DATE OF DISCHARGE:                              OPERATIVE REPORT   PREOPERATIVE DIAGNOSIS:  Painful maltracking post patellofemoral arthroplasty, left knee.  POSTOPERATIVE DIAGNOSIS:  Painful maltracking post patellofemoral arthroplasty, left knee.  PROCEDURE:  Left total knee arthroplasty.  SURGEON:  Gaynelle Arabian, M.D.  ASSISTANT:  Alexzandrew L. Perkins, PA-C.  ANESTHESIA:  Spinal.  ESTIMATED BLOOD LOSS:  Minimal.  DRAINS:  Hemovac x1.  TOURNIQUET TIME:  44 minutes at 300 mmHg.  COMPLICATIONS:  None.  CONDITION:  Stable to recovery.  BRIEF CLINICAL NOTE:  Diane Fox is a 63 year old female who had a left patellofemoral arthroplasty performed last year.  Did well initially, but about 3 months postop started to develop painful popping and maltracking.  Did not respond to physical therapy.  Became more painful. She presents now for open realignment of the patella versus total knee arthroplasty depending on intraoperative findings.  We decided if we cannot completely guarantee that the realignment would eliminate the problem that she rather just go ahead and replace the whole knee.  She presents now for the above-mentioned procedure.  PROCEDURE IN DETAIL:  After successful administration of spinal anesthetic, a tourniquet was placed high on the left thigh and her left lower extremity was prepped and draped in usual sterile fashion. Extremities wrapped in Esmarch.  Knee flexed and tourniquet inflated to 300 mmHg.  A midline incision was made with a 10-blade through subcutaneous tissue to the level of the extensor mechanism.  Medial arthrotomy was then made.  The components were well fixed and in excellent position.  She has  patella alta, so the patella is starting above the trochlear groove and starts somewhat lateral to it and then with flexion, it slips into the trochlea groove and remains in there throughout full range of motion.  I had released lateral tissue and mimicked plicating the medial side, but even with this the patella tended to sit slightly lateral, so I decided I could not definitively fix this as just soft tissue procedure.  She also started to develop some osteophytes along the lateral and medial femoral condyles and had cartilage wear in the medial compartment.  It was decided to go ahead and convert her to a total knee arthroplasty for more normal mechanics. The trochlear implant was then removed by disrupting the interface between it and the bone with osteotomes.  The soft tissue of the proximal medial tibia was then subperiosteally elevated to the joint line with a knife and the semimembranosus bursa with a Cobb elevator. Soft tissue laterally was elevated with attention being paid to avoid the patellar tendon on tibial tubercle.  The patella is everted, knee flexed to 90 degrees and ACL and PCL removed.  Drill was used to create the starting hole in the distal femur.  The canal was thoroughly irrigated.  5-degree left valgus alignment guide was placed and the distal femoral cutting block was pinned  to remove 9 mm off the distal femur.  Distal femoral resection was made with an oscillating saw.  The tibia subluxed forward and the menisci removed.  Extramedullary tibial alignment guide was placed referencing proximally at the medial aspect of the tibial tubercle and distally along the second metatarsal axis and tibial crest.  Block was pinned to remove 10 mm off the non- deficient lateral side.  Tibial resection was made with an oscillating saw.  Size 5 is the most appropriate tibial component for the Attune system.  We prepared the proximal tibia with a modular drill and keel punch for  the size 5.  The femoral sizing guide was placed, size 5 was the most appropriate to the femur also.  Rotation was marked at the epicondylar axis, and we confirmed it by creating a rectangular flexion gap at 90 degrees.  A block was pinned in this rotation and the anterior, posterior, and chamfer cuts are made.  The intercondylar block was placed with a size 5 and neck cut was made.  Trial size 5 posterior stabilized femur was placed, size 5 tibia, and an 6 mm rotating platform posterior stabilized insert.  Full extension was achieved with excellent varus and valgus, and anterior and posterior balance throughout full range of motion.  The patella tracks normally throughout.  We kept the patellar component intact as the thickness was composite of the patella and the bone was 22 mm and if I resected the implant, the bone thickness would not be thick enough to support it on the patella.  Since it tracked normally, we left it intact.  The trials were then removed and the wound copiously irrigated with saline solution.  Prior to that, I drilled the lug holes to the distal femur, and we also removed osteophytes off the posterior femur with the trial in place.  Cut bone surfaces were then prepared with pulsatile lavage.  Cement was mixed and once ready for implantation, a size 5 Attune tibial tray with a size 5 posterior stabilized femur, and a 6 mm trial inserts were placed.  Full extension still achieved with excellent varus-valgus and anterior-posterior balance throughout.  All extruded cement was removed.  When the cement fully hardened, then the permanent 6 mm posterior stabilized rotating platform insert was placed in tibial tray.  Further irrigation was performed.  The Exparel which is 20 mL mixed with 40 mL of saline was then injected into the subcu tissues medial retinaculum and the extensor mechanism.  20 mL of 0.25% Marcaine was injected into the same tissues. The arthrotomy was then  closed over Hemovac drain with running #1 V-Loc suture.  Flexion against gravity is 140 degrees.  Tourniquet was released.  Total tourniquet time of 45 minutes.  Subcu was then closed with interrupted 2-0 Vicryl and subcuticular running 4-0 Monocryl.  The incision was cleaned and dried and Steri-Strips and a bulky sterile dressing were applied.  The patient was then placed into a knee immobilizer, awakened and transported to recovery in stable condition. Note that a surgical assistant was a medical necessity for this procedure to perform it in a safe and expeditious manner.  Surgical assistant was necessary for retraction of vital ligaments and neurovascular structures and also for proper positioning of the limb to allow for adequate alignment of the limb with the prosthesis.     Gaynelle Arabian, M.D.     FA/MEDQ  D:  07/05/2014  T:  07/06/2014  Job:  [id number]

## 2014-07-06 NOTE — Plan of Care (Signed)
Problem: Phase III Progression Outcomes Goal: Anticoagulant follow-up in place Outcome: Not Applicable Date Met:  95/32/02 Xarelto VTE, no f/u needed.

## 2014-07-06 NOTE — Evaluation (Signed)
Occupational Therapy Evaluation Patient Details Name: Diane Fox MRN: [id number] DOB: 1951/05/02 Today's Date: 07/06/2014    History of Present Illness 63 year old female s/p L TKA due to maltracking post left patellofemoral arthoplasty.   Clinical Impression   Patient presents during OT evaluation with pain well controlled, and moving very well.  Patient has necessary DME and is familiar with adaptive equipment to aide with BADL's.  Patient has excellent family support.  Patient able to demonstrate basic self care skills at modified independent level today.  No further OT services warranted at this time.  Thanks for this referral.    Follow Up Recommendations  No OT follow up    Equipment Recommendations  3 in 1 bedside comode    Recommendations for Other Services       Precautions / Restrictions Precautions Precautions: Knee Required Braces or Orthoses: Knee Immobilizer - Left Knee Immobilizer - Left: Discontinue once straight leg raise with < 10 degree lag Restrictions Weight Bearing Restrictions: No Other Position/Activity Restrictions: WBAT      Mobility Bed Mobility Overal bed mobility: Modified Independent Bed Mobility: Sit to Supine;Supine to Sit     Supine to sit: Modified independent (Device/Increase time);HOB elevated Sit to supine: Modified independent (Device/Increase time)   General bed mobility comments: verbal cues for self assist  Transfers Overall transfer level: Modified independent Equipment used: Rolling walker (2 wheeled) Transfers: Sit to/from Stand Sit to Stand: Modified independent (Device/Increase time)         General transfer comment: verbal cues for safe technique    Balance                                            ADL Overall ADL's : Modified independent                                       General ADL Comments: increased time     Vision                     Perception  Perception Perception Tested?: No   Praxis Praxis Praxis tested?: Not tested    Pertinent Vitals/Pain Denies pain     Hand Dominance Right   Extremity/Trunk Assessment Upper Extremity Assessment Upper Extremity Assessment: Overall WFL for tasks assessed   Lower Extremity Assessment Lower Extremity Assessment: Defer to PT evaluation LLE Deficits / Details: unable to perform SLR, maintained KI   Cervical / Trunk Assessment Cervical / Trunk Assessment: Normal   Communication Communication Communication: No difficulties   Cognition Arousal/Alertness: Awake/alert Behavior During Therapy: WFL for tasks assessed/performed Overall Cognitive Status: Within Functional Limits for tasks assessed                     General Comments       Exercises       Shoulder Instructions      Home Living Family/patient expects to be discharged to:: Private residence Living Arrangements: Spouse/significant other Available Help at Discharge: Family;Available 24 hours/day Type of Home: House Home Access: Stairs to enter [employer] of Steps: 2 Entrance Stairs-Rails: None Home Layout: Able to live on main level with bedroom/bathroom     Bathroom Shower/Tub: Tub/shower unit;Curtain Shower/tub characteristics: [place]  Equipment: Gilford Rile - 2 wheels;Bedside commode (has bench which she uses for shower)          Prior Functioning/Environment Level of Independence: Independent        Comments: reacher     OT Diagnosis:     OT Problem List:     OT Treatment/Interventions:      OT Goals(Current goals can be found in the care plan section) Acute Rehab OT Goals Patient Stated Goal: return home with husband OT Goal Formulation: With patient Potential to Achieve Goals: Good  OT Frequency:     Barriers to D/C:            Co-evaluation              End of Session Equipment Utilized During Treatment: Rolling walker CPM Left Knee CPM Left  Knee: Off  Activity Tolerance: Patient tolerated treatment well Patient left: in bed;with call bell/phone within reach   Time: 1400-1436 OT Time Calculation (min): 36 min Charges:  OT General Charges $OT Visit: 1 Procedure OT Evaluation $Initial OT Evaluation Tier I: 1 Procedure OT Treatments $Self Care/Home Management : 23-37 mins G-Codes:    Mariah Milling 2014-07-08, 2:38 PM

## 2014-07-06 NOTE — Discharge Instructions (Addendum)
° °Dr. Frank Aluisio °Total Joint Specialist °[facility] °[address] °[place], [place] [address] °[phone] ° °TOTAL KNEE REPLACEMENT POSTOPERATIVE DIRECTIONS ° ° ° °Knee Rehabilitation, Guidelines Following Surgery  °Results after knee surgery are often greatly improved when you follow the exercise, range of motion and muscle strengthening exercises prescribed by your doctor. Safety measures are also important to protect the knee from further injury. Any time any of these exercises cause you to have increased pain or swelling in your knee joint, decrease the amount until you are comfortable again and slowly increase them. If you have problems or questions, call your caregiver or physical therapist for advice.  ° °HOME CARE INSTRUCTIONS  °Remove items at home which could result in a fall. This includes throw rugs or furniture in walking pathways.  °Continue medications as instructed at time of discharge. °You may have some home medications which will be placed on hold until you complete the course of blood thinner medication.  °You may start showering once you are discharged home but do not submerge the incision under water. Just pat the incision dry and apply a dry gauze dressing on daily. °Walk with walker as instructed.  °You may resume a sexual relationship in one month or when given the OK by  your doctor.  °· Use walker as long as suggested by your caregivers. °· Avoid periods of inactivity such as sitting longer than an hour when not asleep. This helps prevent blood clots.  °You may put full weight on your legs and walk as much as is comfortable.  °You may return to work once you are cleared by your doctor.  °Do not drive a car for 6 weeks or until released by you surgeon.  °· Do not drive while taking narcotics.  °Wear the elastic stockings for three weeks following surgery during the day but you may remove then at night. °Make sure you keep all of your appointments after your  operation with all of your doctors and caregivers. You should call the office at the above phone number and make an appointment for approximately two weeks after the date of your surgery. °Change the dressing daily and reapply a dry dressing each time. °Please pick up a stool softener and laxative for home use as long as you are requiring pain medications. °· Continue to use ice on the knee for pain and swelling from surgery. You may notice swelling that will progress down to the foot and ankle.  This is normal after surgery.  Elevate the leg when you are not up walking on it.   °It is important for you to complete the blood thinner medication as prescribed by your doctor. °· Continue to use the breathing machine which will help keep your temperature down.  It is common for your temperature to cycle up and down following surgery, especially at night when you are not up moving around and exerting yourself.  The breathing machine keeps your lungs expanded and your temperature down. ° °RANGE OF MOTION AND STRENGTHENING EXERCISES  °Rehabilitation of the knee is important following a knee injury or an operation. After just a few days of immobilization, the muscles of the thigh which control the knee become weakened and shrink (atrophy). Knee exercises are designed to build up the tone and strength of the thigh muscles and to improve knee motion. Often times heat used for twenty to thirty minutes before working out will loosen up your tissues and help with improving the   range of motion but do not use heat for the first two weeks following surgery. These exercises can be done on a training (exercise) mat, on the floor, on a table or on a bed. Use what ever works the best and is most comfortable for you Knee exercises include:  °Leg Lifts - While your knee is still immobilized in a splint or cast, you can do straight leg raises. Lift the leg to 60 degrees, hold for 3 sec, and slowly lower the leg. Repeat 10-20 times 2-3  times daily. Perform this exercise against resistance later as your knee gets better.  °Quad and Hamstring Sets - Tighten up the muscle on the front of the thigh (Quad) and hold for 5-10 sec. Repeat this 10-20 times hourly. Hamstring sets are done by pushing the foot backward against an object and holding for 5-10 sec. Repeat as with quad sets.  °A rehabilitation program following serious knee injuries can speed recovery and prevent re-injury in the future due to weakened muscles. Contact your doctor or a physical therapist for more information on knee rehabilitation.  ° °SKILLED REHAB INSTRUCTIONS: °If the patient is transferred to a skilled rehab facility following release from the hospital, a list of the current medications will be sent to the facility for the patient to continue.  When discharged from the skilled rehab facility, please have the facility set up the patient's Home Health Physical Therapy prior to being released. Also, the skilled facility will be responsible for providing the patient with their medications at time of release from the facility to include their pain medication, the muscle relaxants, and their blood thinner medication. If the patient is still at the rehab facility at time of the two week follow up appointment, the skilled rehab facility will also need to assist the patient in arranging follow up appointment in our office and any transportation needs. ° °MAKE SURE YOU:  °Understand these instructions.  °Will watch your condition.  °Will get help right away if you are not doing well or get worse.  ° ° °Pick up stool softner and laxative for home. °Do not submerge incision under water. °May shower. °Continue to use ice for pain and swelling from surgery. ° °Take Xarelto for two and a half more weeks, then discontinue Xarelto. °Once the patient has completed the blood thinner regimen, then take a Baby 81 mg Aspirin daily for three more weeks. ° °Information on my medicine - XARELTO®  (Rivaroxaban) ° °This medication education was reviewed with me or my healthcare representative as part of my discharge preparation.  The pharmacist that spoke with me during my hospital stay was:  Green, Terri L, RPH ° °Why was Xarelto® prescribed for you? °Xarelto® was prescribed for you to reduce the risk of blood clots forming after orthopedic surgery. The medical term for these abnormal blood clots is venous thromboembolism (VTE). ° °What do you need to know about xarelto® ? °Take your Xarelto® ONCE DAILY at the same time every day. °You may take it either with or without food. ° °If you have difficulty swallowing the tablet whole, you may crush it and mix in applesauce just prior to taking your dose. ° °Take Xarelto® exactly as prescribed by your doctor and DO NOT stop taking Xarelto® without talking to the doctor who prescribed the medication.  Stopping without other VTE prevention medication to take the place of Xarelto® may increase your risk of developing a clot. ° °After discharge, you should have regular check-up appointments   with your healthcare provider that is prescribing your Xarelto®.   ° °What do you do if you miss a dose? °If you miss a dose, take it as soon as you remember on the same day then continue your regularly scheduled once daily regimen the next day. Do not take two doses of Xarelto® on the same day.  ° °Important Safety Information °A possible side effect of Xarelto® is bleeding. You should call your healthcare provider right away if you experience any of the following: °  Bleeding from an injury or your nose that does not stop. °  Unusual colored urine (red or dark brown) or unusual colored stools (red or black). °  Unusual bruising for unknown reasons. °  A serious fall or if you hit your head (even if there is no bleeding). ° °Some medicines may interact with Xarelto® and might increase your risk of bleeding while on Xarelto®. To help avoid this, consult your healthcare provider or  pharmacist prior to using any new prescription or non-prescription medications, including herbals, vitamins, non-steroidal anti-inflammatory drugs (NSAIDs) and supplements. ° °This website has more information on Xarelto®: www.xarelto.com. ° ° ° °

## 2014-07-06 NOTE — Progress Notes (Signed)
   Subjective: 1 Day Post-Op Procedure(s) (LRB): LEFT TOTAL KNEE ARTHROPLASTY (Left) Patient reports pain as moderate.   Patient seen in rounds with Dr. Wynelle Fox.  Patient had a rough night but better this morning. Patient is well, but has had some minor complaints of pain in the knee, requiring pain medications We will start therapy today.  Plan is to go Home after hospital stay.  Objective: Vital signs in last 24 hours: Temp:  [97.4 F (36.3 C)-98.6 F (37 C)] 97.4 F (36.3 C) (07/21 0601) Pulse Rate:  [59-96] 63 (07/21 0601) Resp:  [12-20] 15 (07/21 0754) BP: (91-141)/(43-84) 118/71 mmHg (07/21 0601) SpO2:  [94 %-100 %] 98 % (07/21 0754) Weight:  [80.74 kg (178 lb)] 80.74 kg (178 lb) (07/20 1213)  Intake/Output from previous day:  Intake/Output Summary (Last 24 hours) at 07/06/14 0928 Last data filed at 07/06/14 0715  Gross per 24 hour  Intake   3535 ml  Output   2670 ml  Net    865 ml    Intake/Output this shift: Total I/O In: 240 [P.O.:240] Out: -   Labs:  Recent Labs  07/06/14 0408  HGB 13.0    Recent Labs  07/06/14 0408  WBC 9.9  RBC 4.36  HCT 38.3  PLT 300    Recent Labs  07/06/14 0408  NA 138  K 3.7  CL 100  CO2 26  BUN 16  CREATININE 0.85  GLUCOSE 171*  CALCIUM 8.6   No results found for this basename: LABPT, INR,  in the last 72 hours  EXAM General - Patient is Alert, Appropriate and Oriented Extremity - Neurovascular intact Sensation intact distally Dorsiflexion/Plantar flexion intact Dressing - dressing C/D/I Motor Function - intact, moving foot and toes well on exam.  Hemovac pulled without difficulty.  Past Medical History  Diagnosis Date  . Hypercholesterolemia   . Atypical chest pain march 2015    april stress test=normal  . Arthritis   . Peri-menopausal     lexapro prn  . Edema     maxide prn  . 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 H/O measles   . H/O mumps   . Asthma     allergy related,  no attacks  . Environmental allergies     Assessment/Plan: 1 Day Post-Op Procedure(s) (LRB): LEFT TOTAL KNEE ARTHROPLASTY (Left) Active Problems:   OA (osteoarthritis) of knee  Estimated body mass index is 31.54 kg/(m^2) as calculated from the following:   Height as of this encounter: 5\' 3"  (1.6 m).   Weight as of this encounter: 80.74 kg (178 lb). Advance diet Up with therapy Discharge home with home health  DVT Prophylaxis - Xarelto Weight-Bearing as tolerated to left leg D/C O2 and Pulse OX and try on Room Air  [name], PA-C Orthopaedic Surgery 07/06/2014, 9:28 AM

## 2014-07-07 LAB — BASIC METABOLIC PANEL
Anion gap: 11 (ref 5–15)
BUN: 18 mg/dL (ref 6–23)
CO2: 29 mEq/L (ref 19–32)
Calcium: 9.3 mg/dL (ref 8.4–10.5)
Chloride: 101 mEq/L (ref 96–112)
Creatinine, Ser: 0.89 mg/dL (ref 0.50–1.10)
GFR, EST AFRICAN AMERICAN: 78 mL/min — AB (ref >90)
GFR, EST NON AFRICAN AMERICAN: 68 mL/min — AB (ref >90)
Glucose, Bld: 125 mg/dL — ABNORMAL HIGH (ref 70–99)
POTASSIUM: 3.9 meq/L (ref 3.7–5.3)
Sodium: 141 mEq/L (ref 137–147)

## 2014-07-07 LAB — CBC
HCT: 37.4 % (ref 36.0–46.0)
Hemoglobin: 12.7 g/dL (ref 12.0–15.0)
MCH: 30 pg (ref 26.0–34.0)
MCHC: 34 g/dL (ref 30.0–36.0)
MCV: 88.2 fL (ref 78.0–100.0)
PLATELETS: 322 10*3/uL (ref 150–400)
RBC: 4.24 MIL/uL (ref 3.87–5.11)
RDW: 13.3 % (ref 11.5–15.5)
WBC: 13.2 10*3/uL — ABNORMAL HIGH (ref 4.0–10.5)

## 2014-07-07 MED ORDER — OXYCODONE HCL 5 MG PO TABS: 5.0000 mg | ORAL_TABLET | ORAL | Status: DC | PRN

## 2014-07-07 MED ORDER — METHOCARBAMOL 500 MG PO TABS: 500.0000 mg | ORAL_TABLET | Freq: Four times a day (QID) | ORAL | Status: DC | PRN

## 2014-07-07 MED ORDER — RIVAROXABAN 10 MG PO TABS
10.0000 mg | ORAL_TABLET | Freq: Every day | ORAL | Status: DC
Start: 2014-07-07 — End: 2015-03-28

## 2014-07-07 NOTE — Progress Notes (Signed)
Physical Therapy Treatment Patient Details Name: Diane Fox MRN: [id number] DOB: [birth year] Today's Date: 07/07/2014    History of Present Illness 63 year old female s/p L TKA due to maltracking post left patellofemoral arthoplasty.    PT Comments    Pt ambulated in hallway, practiced steps, and performed LE exercises.  Pt feels ready for d/c home today.  Pt had no further questions/concerns.  Follow Up Recommendations  Home health PT     Equipment Recommendations  None recommended by PT    Recommendations for Other Services       Precautions / Restrictions Precautions Precautions: Knee Precaution Comments: able to perform SLR Required Braces or Orthoses: Knee Immobilizer - Left Knee Immobilizer - Left: Discontinue once straight leg raise with < 10 degree lag Restrictions Weight Bearing Restrictions: No Other Position/Activity Restrictions: WBAT    Mobility  Bed Mobility Overal bed mobility: Modified Independent             General bed mobility comments: able to self assist L LE  Transfers Overall transfer level: Modified independent               General transfer comment: performed transfers safely  Ambulation/Gait Ambulation/Gait assistance: Supervision Ambulation Distance (Feet): 200 Feet Assistive device: Rolling walker (2 wheeled) Gait Pattern/deviations: Step-through pattern;Decreased stance time - left;Decreased step length - right;Antalgic     General Gait Details: verbal cues for heel strike   Stairs Stairs: Yes Stairs assistance: Supervision Stair Management: Step to pattern;Forwards;With walker Number of Stairs: 1 General stair comments: verbal cues for safety and sequence once and then pt performed again correctly without cues  Wheelchair Mobility    Modified Rankin (Stroke Patients Only)       Balance                                    Cognition Arousal/Alertness: Awake/alert Behavior During Therapy:  WFL for tasks assessed/performed Overall Cognitive Status: Within Functional Limits for tasks assessed                      Exercises Total Joint Exercises Ankle Circles/Pumps: AROM;Both;15 reps Quad Sets: AROM;Left;15 reps Short Arc Quad: Left;15 reps;AAROM Heel Slides: AAROM;Left;15 reps;Supine Hip ABduction/ADduction: AROM;Left;15 reps Straight Leg Raises: 10 reps;Left;AROM Goniometric ROM: L knee AAROM -3-75* supine    General Comments        Pertinent Vitals/Pain 5-6/10 L knee pain during gait, pt reports tolerable, activity to tolerance, premedicated, ice packs applied    Home Living                      Prior Function            PT Goals (current goals can now be found in the care plan section) Progress towards PT goals: Progressing toward goals    Frequency  7X/week    PT Plan Current plan remains appropriate    Co-evaluation             End of Session   Activity Tolerance: Patient tolerated treatment well Patient left: in bed;with call bell/phone within reach     Time: 0846-0909 PT Time Calculation (min): 23 min  Charges:  $Gait Training: 8-22 mins $Therapeutic Exercise: 8-22 mins                    G Codes:  Kaynan Klonowski,KATHrine E 07/07/2014, 9:22 AM Carmelia Bake, PT, DPT 07/07/2014 Pager: [phone]

## 2014-07-07 NOTE — Discharge Summary (Signed)
Physician Discharge Summary   Patient ID: KAMBRIE EDDLEMAN MRN: [id number] DOB/AGE: [birth year] 63 y.o.  Admit date: 07/05/2014 Discharge date: 07/07/2014  Primary Diagnosis:  Painful maltracking post patellofemoral  arthroplasty, left knee.  Admission Diagnoses:  Past Medical History  Diagnosis Date  . Hypercholesterolemia   . Atypical chest pain march 2015    april stress test=normal  . Arthritis   . Peri-menopausal     lexapro prn  . Edema     maxide prn  . 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 H/O measles   . H/O mumps   . Asthma     allergy related, no attacks  . Environmental allergies    Discharge Diagnoses:   Active Problems:   OA (osteoarthritis) of knee  Estimated body mass index is 31.54 kg/(m^2) as calculated from the following:   Height as of this encounter: _0  (1.6 m).   Weight as of this encounter: 80.74 kg (178 lb).  Procedure:  Procedure(s) (LRB): LEFT TOTAL KNEE ARTHROPLASTY (Left)   Consults: None  HPI: Ms. Ruark is a 63 year old female who had a left  patellofemoral arthroplasty performed last year. Did well initially,  but about 3 months postop started to develop painful popping and  maltracking. Did not respond to physical therapy. Became more painful.  She presents now for open realignment of the patella versus total knee  arthroplasty depending on intraoperative findings. We decided if we  cannot completely guarantee that the realignment would eliminate the  problem that she rather just go ahead and replace the whole knee. She  presents now for the above-mentioned procedure.  Laboratory Data: Admission on 07/05/2014, Discharged on 07/07/2014  Component Date Value Ref Range Status  . ABO/RH(D) 07/05/2014 A POS   Final  . Antibody Screen 07/05/2014 NEG   Final  . Sample Expiration 07/05/2014 07/08/2014   Final  . WBC 07/06/2014 9.9  4.0 - 10.5 K/uL Final  . RBC 07/06/2014 4.36  3.87 - 5.11 MIL/uL Final  .  Hemoglobin 07/06/2014 13.0  12.0 - 15.0 g/dL Final  . HCT 07/06/2014 38.3  36.0 - 46.0 % Final  . MCV 07/06/2014 87.8  78.0 - 100.0 fL Final  . MCH 07/06/2014 29.8  26.0 - 34.0 pg Final  . MCHC 07/06/2014 33.9  30.0 - 36.0 g/dL Final  . RDW 07/06/2014 12.8  11.5 - 15.5 % Final  . Platelets 07/06/2014 300  150 - 400 K/uL Final  . Sodium 07/06/2014 138  137 - 147 mEq/L Final  . Potassium 07/06/2014 3.7  3.7 - 5.3 mEq/L Final  . Chloride 07/06/2014 100  96 - 112 mEq/L Final  . CO2 07/06/2014 26  19 - 32 mEq/L Final  . Glucose, Bld 07/06/2014 171* 70 - 99 mg/dL Final  . BUN 07/06/2014 16  6 - 23 mg/dL Final  . Creatinine, Ser 07/06/2014 0.85  0.50 - 1.10 mg/dL Final  . Calcium 07/06/2014 8.6  8.4 - 10.5 mg/dL Final  . GFR calc non Af Amer 07/06/2014 71* >90 mL/min Final  . GFR calc Af Amer 07/06/2014 83* >90 mL/min Final   Comment: (NOTE)                          The eGFR has been calculated using the CKD EPI equation.  This calculation has not been validated in all clinical situations.                          eGFR's persistently <90 mL/min signify possible Chronic Kidney                          Disease.  . Anion gap 07/06/2014 12  5 - 15 Final  . WBC 07/07/2014 13.2* 4.0 - 10.5 K/uL Final  . RBC 07/07/2014 4.24  3.87 - 5.11 MIL/uL Final  . Hemoglobin 07/07/2014 12.7  12.0 - 15.0 g/dL Final  . HCT 07/07/2014 37.4  36.0 - 46.0 % Final  . MCV 07/07/2014 88.2  78.0 - 100.0 fL Final  . MCH 07/07/2014 30.0  26.0 - 34.0 pg Final  . MCHC 07/07/2014 34.0  30.0 - 36.0 g/dL Final  . RDW 07/07/2014 13.3  11.5 - 15.5 % Final  . Platelets 07/07/2014 322  150 - 400 K/uL Final  . Sodium 07/07/2014 141  137 - 147 mEq/L Final  . Potassium 07/07/2014 3.9  3.7 - 5.3 mEq/L Final  . Chloride 07/07/2014 101  96 - 112 mEq/L Final  . CO2 07/07/2014 29  19 - 32 mEq/L Final  . Glucose, Bld 07/07/2014 125* 70 - 99 mg/dL Final  . BUN 07/07/2014 18  6 - 23 mg/dL Final  . Creatinine,  Ser 07/07/2014 0.89  0.50 - 1.10 mg/dL Final  . Calcium 07/07/2014 9.3  8.4 - 10.5 mg/dL Final  . GFR calc non Af Amer 07/07/2014 68* >90 mL/min Final  . GFR calc Af Amer 07/07/2014 78* >90 mL/min Final   Comment: (NOTE)                          The eGFR has been calculated using the CKD EPI equation.                          This calculation has not been validated in all clinical situations.                          eGFR's persistently <90 mL/min signify possible Chronic Kidney                          Disease.  Georgiann Hahn gap 07/07/2014 11  5 - 15 Final  Hospital Outpatient Visit on 06/24/2014  Component Date Value Ref Range Status  . WBC 06/24/2014 6.2  4.0 - 10.5 K/uL Final  . RBC 06/24/2014 4.59  3.87 - 5.11 MIL/uL Final  . Hemoglobin 06/24/2014 13.7  12.0 - 15.0 g/dL Final  . HCT 06/24/2014 40.0  36.0 - 46.0 % Final  . MCV 06/24/2014 87.1  78.0 - 100.0 fL Final  . MCH 06/24/2014 29.8  26.0 - 34.0 pg Final  . MCHC 06/24/2014 34.3  30.0 - 36.0 g/dL Final  . RDW 06/24/2014 13.1  11.5 - 15.5 % Final  . Platelets 06/24/2014 315  150 - 400 K/uL Final  . Sodium 06/24/2014 139  137 - 147 mEq/L Final  . Potassium 06/24/2014 3.9  3.7 - 5.3 mEq/L Final  . Chloride 06/24/2014 100  96 - 112 mEq/L Final  . CO2 06/24/2014 28  19 - 32 mEq/L Final  . Glucose, Bld 06/24/2014 96  70 -  99 mg/dL Final  . BUN 06/24/2014 19  6 - 23 mg/dL Final  . Creatinine, Ser 06/24/2014 0.96  0.50 - 1.10 mg/dL Final  . Calcium 06/24/2014 9.7  8.4 - 10.5 mg/dL Final  . GFR calc non Af Amer 06/24/2014 62* >90 mL/min Final  . GFR calc Af Amer 06/24/2014 71* >90 mL/min Final   Comment: (NOTE)                          The eGFR has been calculated using the CKD EPI equation.                          This calculation has not been validated in all clinical situations.                          eGFR's persistently <90 mL/min signify possible Chronic Kidney                          Disease.  . Anion gap 06/24/2014 11  5 - 15  Final  . Prothrombin Time 06/24/2014 12.6  11.6 - 15.2 seconds Final  . INR 06/24/2014 0.94  0.00 - 1.49 Final  . Color, Urine 06/24/2014 YELLOW  YELLOW Final  . APPearance 06/24/2014 CLOUDY* CLEAR Final  . Specific Gravity, Urine 06/24/2014 1.020  1.005 - 1.030 Final  . pH 06/24/2014 6.5  5.0 - 8.0 Final  . Glucose, UA 06/24/2014 NEGATIVE  NEGATIVE mg/dL Final  . Hgb urine dipstick 06/24/2014 NEGATIVE  NEGATIVE Final  . Bilirubin Urine 06/24/2014 NEGATIVE  NEGATIVE Final  . Ketones, ur 06/24/2014 NEGATIVE  NEGATIVE mg/dL Final  . Protein, ur 06/24/2014 NEGATIVE  NEGATIVE mg/dL Final  . Urobilinogen, UA 06/24/2014 0.2  0.0 - 1.0 mg/dL Final  . Nitrite 06/24/2014 NEGATIVE  NEGATIVE Final  . Leukocytes, UA 06/24/2014 SMALL* NEGATIVE Final  . MRSA, PCR 06/24/2014 NEGATIVE  NEGATIVE Final  . Staphylococcus aureus 06/24/2014 NEGATIVE  NEGATIVE Final   Comment:                                 The Xpert SA Assay (FDA                          approved for NASAL specimens                          in patients over 51 years of age),                          is one component of                          a comprehensive surveillance                          program.  Test performance has                          been validated by [employer] for patients greater                          than or equal to 93 year old.                          It is not intended                          to diagnose infection nor to                          guide or monitor treatment.  Marland Kitchen aPTT 06/24/2014 35  24 - 37 seconds Final  . Squamous Epithelial / LPF 06/24/2014 RARE  RARE Final  . WBC, UA 06/24/2014 3-6  <3 WBC/hpf Final  . Bacteria, UA 06/24/2014 FEW* RARE Final     X-Rays:Dg Chest 2 View  06/24/2014   CLINICAL DATA:  Preop left TKA  EXAM: CHEST  2 VIEW  COMPARISON:  09/25/2012  FINDINGS: The heart size and mediastinal contours are within normal limits. Both lungs are clear. The  visualized skeletal structures are unremarkable.  IMPRESSION: No active cardiopulmonary disease.   Electronically Signed   By: Franchot Gallo M.D.   On: 06/24/2014 09:26    EKG: Orders placed in visit on 03/04/14  . EKG 12-LEAD     Hospital Course: Diane Fox is a 63 y.o. who was admitted to [facility]. They were brought to the operating room on 07/05/2014 and underwent Procedure(s): LEFT TOTAL KNEE ARTHROPLASTY.  Patient tolerated the procedure well and was later transferred to the recovery room and then to the orthopaedic floor for postoperative care.  They were given PO and IV analgesics for pain control following their surgery.  They were given 24 hours of postoperative antibiotics of  Anti-infectives   Start     Dose/Rate Route Frequency Ordered Stop   07/06/14 0200  vancomycin (VANCOCIN) IVPB 1000 mg/200 mL premix     1,000 mg 200 mL/hr over 60 Minutes Intravenous Every 12 hours 07/05/14 1745 07/06/14 0230   07/05/14 1136  vancomycin (VANCOCIN) IVPB 1000 mg/200 mL premix     1,000 mg 200 mL/hr over 60 Minutes Intravenous On call to O.R. 07/05/14 1136 07/05/14 1415     and started on DVT prophylaxis in the form of Xarelto.   PT and OT were ordered for total joint protocol.  Discharge planning consulted to help with postop disposition and equipment needs.  Patient had a tough night on the evening of surgery but was better the next morning.  They started to get up OOB with therapy on day one. Hemovac drain was pulled without difficulty.  Continued to work with therapy into day two.  Dressing was changed on day two and the incision was healing well. Patient was seen in rounds and was ready to go home. Discharge home with home health  Diet - Cardiac diet  Follow up - in 2 weeks  Activity - WBAT  Disposition - Home  Condition Upon Discharge - Good  D/C Meds - See DC Summary  DVT Prophylaxis - Xarelto        Discharge Instructions   Call MD / Call 911    Complete by:   As directed   If you experience chest pain or shortness of breath, CALL 911 and be transported  to the hospital emergency room.  If you develope a fever above 101 F, pus (white drainage) or increased drainage or redness at the wound, or calf pain, call your surgeon's office.     Change dressing    Complete by:  As directed   Change dressing daily with sterile 4 x 4 inch gauze dressing and apply TED hose. Do not submerge the incision under water.     Constipation Prevention    Complete by:  As directed   Drink plenty of fluids.  Prune juice may be helpful.  You may use a stool softener, such as Colace (over the counter) 100 mg twice a day.  Use MiraLax (over the counter) for constipation as needed.     Diet - low sodium heart healthy    Complete by:  As directed      Discharge instructions    Complete by:  As directed   Pick up stool softner and laxative for home. Do not submerge incision under water. May shower. Continue to use ice for pain and swelling from surgery.  Take Xarelto for two and a half more weeks, then discontinue Xarelto. Once the patient has completed the blood thinner regimen, then take a Baby 81 mg Aspirin daily for three more weeks.     Do not put a pillow under the knee. Place it under the heel.    Complete by:  As directed      Do not sit on low chairs, stoools or toilet seats, as it may be difficult to get up from low surfaces    Complete by:  As directed      Driving restrictions    Complete by:  As directed   No driving until released by the physician.     Increase activity slowly as tolerated    Complete by:  As directed      Lifting restrictions    Complete by:  As directed   No lifting until released by the physician.     Patient may shower    Complete by:  As directed   You may shower without a dressing once there is no drainage.  Do not wash over the wound.  If drainage remains, do not shower until drainage stops.     TED hose    Complete by:  As  directed   Use stockings (TED hose) for 3 weeks on both leg(s).  You may remove them at night for sleeping.     Weight bearing as tolerated    Complete by:  As directed             Medication List    STOP taking these medications       multivitamin with minerals Tabs tablet     REFRESH OP      TAKE these medications       escitalopram 10 MG tablet  Commonly known as:  LEXAPRO  Take 10 mg by mouth at bedtime.     fexofenadine 180 MG tablet  Commonly known as:  ALLEGRA  Take 180 mg by mouth daily as needed for allergies.     methocarbamol 500 MG tablet  Commonly known as:  ROBAXIN  Take 1 tablet (500 mg total) by mouth every 6 (six) hours as needed for muscle spasms.     oxyCODONE 5 MG immediate release tablet  Commonly known as:  Oxy IR/ROXICODONE  Take 1-2 tablets (5-10 mg total) by mouth every 3 (three) hours as needed for  moderate pain, severe pain or breakthrough pain.     rivaroxaban 10 MG Tabs tablet  Commonly known as:  XARELTO  - Take 1 tablet (10 mg total) by mouth daily with breakfast. Take Xarelto for two and a half more weeks, then discontinue Xarelto.  - Once the patient has completed the blood thinner regimen, then take a Baby 81 mg Aspirin daily for three more weeks.     rosuvastatin 5 MG tablet  Commonly known as:  CRESTOR  Take 1 tablet (5 mg total) by mouth at bedtime.     triamterene-hydrochlorothiazide 75-50 MG per tablet  Commonly known as:  MAXZIDE  Take 1 tablet by mouth every morning.       Follow-up Information   Follow up with Gearlean Alf, MD. Schedule an appointment as soon as possible for a visit in 2 weeks.   Specialty:  Orthopedic Surgery   Contact information:   [address] [place] [address] [phone]       Signed: [name], PA-C Orthopaedic Surgery 07/22/2014, 8:58 AM

## 2014-07-07 NOTE — Progress Notes (Signed)
   Subjective: 2 Days Post-Op Procedure(s) (LRB): LEFT TOTAL KNEE ARTHROPLASTY (Left) Patient reports pain as mild.   Patient seen in rounds with Dr. Wynelle Link. Patient is well, and has had no acute complaints or problems Patient is ready to go home  Objective: Vital signs in last 24 hours: Temp:  [97.6 F (36.4 C)-98 F (36.7 C)] 97.6 F (36.4 C) (07/22 0557) Pulse Rate:  [62] 62 (07/22 0557) Resp:  [16-17] 17 (07/22 0557) BP: (116-125)/(52-70) 125/70 mmHg (07/22 0557) SpO2:  [96 %-98 %] 96 % (07/22 0557)  Intake/Output from previous day:  Intake/Output Summary (Last 24 hours) at 07/07/14 1259 Last data filed at 07/07/14 0558  Gross per 24 hour  Intake    840 ml  Output   1300 ml  Net   -460 ml    Intake/Output this shift:    Labs:  Recent Labs  07/06/14 0408 07/07/14 0447  HGB 13.0 12.7    Recent Labs  07/06/14 0408 07/07/14 0447  WBC 9.9 13.2*  RBC 4.36 4.24  HCT 38.3 37.4  PLT 300 322    Recent Labs  07/06/14 0408 07/07/14 0447  NA 138 141  K 3.7 3.9  CL 100 101  CO2 26 29  BUN 16 18  CREATININE 0.85 0.89  GLUCOSE 171* 125*  CALCIUM 8.6 9.3   No results found for this basename: LABPT, INR,  in the last 72 hours  EXAM: General - Patient is Alert and Appropriate Extremity - Neurovascular intact Sensation intact distally Incision - clean, dry Motor Function - intact, moving foot and toes well on exam.   Assessment/Plan: 2 Days Post-Op Procedure(s) (LRB): LEFT TOTAL KNEE ARTHROPLASTY (Left) Procedure(s) (LRB): LEFT TOTAL KNEE ARTHROPLASTY (Left) Past Medical History  Diagnosis Date  . Hypercholesterolemia   . Atypical chest pain march 2015    april stress test=normal  . Arthritis   . Peri-menopausal     lexapro prn  . Edema     maxide prn  . 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 H/O measles   . H/O mumps   . Asthma     allergy related, no attacks  . Environmental allergies    Active Problems:   OA  (osteoarthritis) of knee  Estimated body mass index is 31.54 kg/(m^2) as calculated from the following:   Height as of this encounter: 5\' 3"  (1.6 m).   Weight as of this encounter: 80.74 kg (178 lb). Up with therapy Discharge home with home health Diet - Cardiac diet Follow up - in 2 weeks Activity - WBAT Disposition - Home Condition Upon Discharge - Good D/C Meds - See DC Summary DVT Prophylaxis - Xarelto  [name], PA-C Orthopaedic Surgery 07/07/2014, 12:59 PM

## 2014-07-12 ENCOUNTER — Encounter (HOSPITAL_COMMUNITY): Payer: 59

## 2014-07-27 ENCOUNTER — Ambulatory Visit: Payer: 59 | Attending: Orthopedic Surgery

## 2014-07-27 DIAGNOSIS — M25669 Stiffness of unspecified knee, not elsewhere classified: Secondary | ICD-10-CM | POA: Insufficient documentation

## 2014-07-27 DIAGNOSIS — IMO0001 Reserved for inherently not codable concepts without codable children: Secondary | ICD-10-CM | POA: Diagnosis not present

## 2014-07-27 DIAGNOSIS — Z96659 Presence of unspecified artificial knee joint: Secondary | ICD-10-CM | POA: Insufficient documentation

## 2014-07-27 DIAGNOSIS — M25569 Pain in unspecified knee: Secondary | ICD-10-CM | POA: Insufficient documentation

## 2014-07-28 ENCOUNTER — Ambulatory Visit: Payer: 59

## 2014-07-28 ENCOUNTER — Other Ambulatory Visit: Payer: Self-pay | Admitting: Nurse Practitioner

## 2014-07-28 DIAGNOSIS — IMO0001 Reserved for inherently not codable concepts without codable children: Secondary | ICD-10-CM | POA: Diagnosis not present

## 2014-07-28 NOTE — Telephone Encounter (Signed)
eScribe request from [facility] for refill on LEXAPRO Last filled - 06/23/13, #90 X 3 Last AEX - 06/29/14 Next AEX - 07/05/15 RX sent per last AEX note.

## 2014-08-03 ENCOUNTER — Ambulatory Visit: Payer: 59 | Admitting: Rehabilitation

## 2014-08-03 DIAGNOSIS — IMO0001 Reserved for inherently not codable concepts without codable children: Secondary | ICD-10-CM | POA: Diagnosis not present

## 2014-08-05 ENCOUNTER — Ambulatory Visit: Payer: 59

## 2014-08-05 DIAGNOSIS — IMO0001 Reserved for inherently not codable concepts without codable children: Secondary | ICD-10-CM | POA: Diagnosis not present

## 2014-08-10 ENCOUNTER — Ambulatory Visit: Payer: 59

## 2014-08-10 DIAGNOSIS — IMO0001 Reserved for inherently not codable concepts without codable children: Secondary | ICD-10-CM | POA: Diagnosis not present

## 2014-08-11 ENCOUNTER — Ambulatory Visit: Payer: 59

## 2014-08-11 DIAGNOSIS — IMO0001 Reserved for inherently not codable concepts without codable children: Secondary | ICD-10-CM | POA: Diagnosis not present

## 2014-08-16 ENCOUNTER — Ambulatory Visit: Payer: 59

## 2014-08-16 DIAGNOSIS — IMO0001 Reserved for inherently not codable concepts without codable children: Secondary | ICD-10-CM | POA: Diagnosis not present

## 2014-08-19 ENCOUNTER — Ambulatory Visit: Payer: 59 | Attending: Orthopedic Surgery | Admitting: Rehabilitation

## 2014-08-19 DIAGNOSIS — IMO0001 Reserved for inherently not codable concepts without codable children: Secondary | ICD-10-CM | POA: Insufficient documentation

## 2014-08-19 DIAGNOSIS — M25569 Pain in unspecified knee: Secondary | ICD-10-CM | POA: Insufficient documentation

## 2014-08-19 DIAGNOSIS — Z96659 Presence of unspecified artificial knee joint: Secondary | ICD-10-CM | POA: Insufficient documentation

## 2014-08-19 DIAGNOSIS — M25669 Stiffness of unspecified knee, not elsewhere classified: Secondary | ICD-10-CM | POA: Diagnosis not present

## 2014-08-24 ENCOUNTER — Encounter: Payer: 59 | Admitting: Rehabilitation

## 2014-08-26 ENCOUNTER — Encounter: Payer: 59 | Admitting: Rehabilitation

## 2014-10-18 ENCOUNTER — Encounter (HOSPITAL_COMMUNITY): Payer: Self-pay | Admitting: Orthopedic Surgery

## 2014-10-25 ENCOUNTER — Other Ambulatory Visit: Payer: Self-pay

## 2014-10-25 DIAGNOSIS — Z1231 Encounter for screening mammogram for malignant neoplasm of breast: Secondary | ICD-10-CM

## 2014-11-10 ENCOUNTER — Ambulatory Visit
Admission: RE | Admit: 2014-11-10 | Discharge: 2014-11-10 | Disposition: A | Discharge status: Home / Self Care | Payer: 59 | Source: Ambulatory Visit

## 2014-11-10 DIAGNOSIS — Z1231 Encounter for screening mammogram for malignant neoplasm of breast: Secondary | ICD-10-CM

## 2014-12-10 IMAGING — CR DG CHEST 2V
2 series · 2 of 2 positions shown · non-contrast
Comparison: 09/25/2012

CLINICAL DATA: Preop left TKA

EXAM:
CHEST  2 VIEW

[w chest pa]
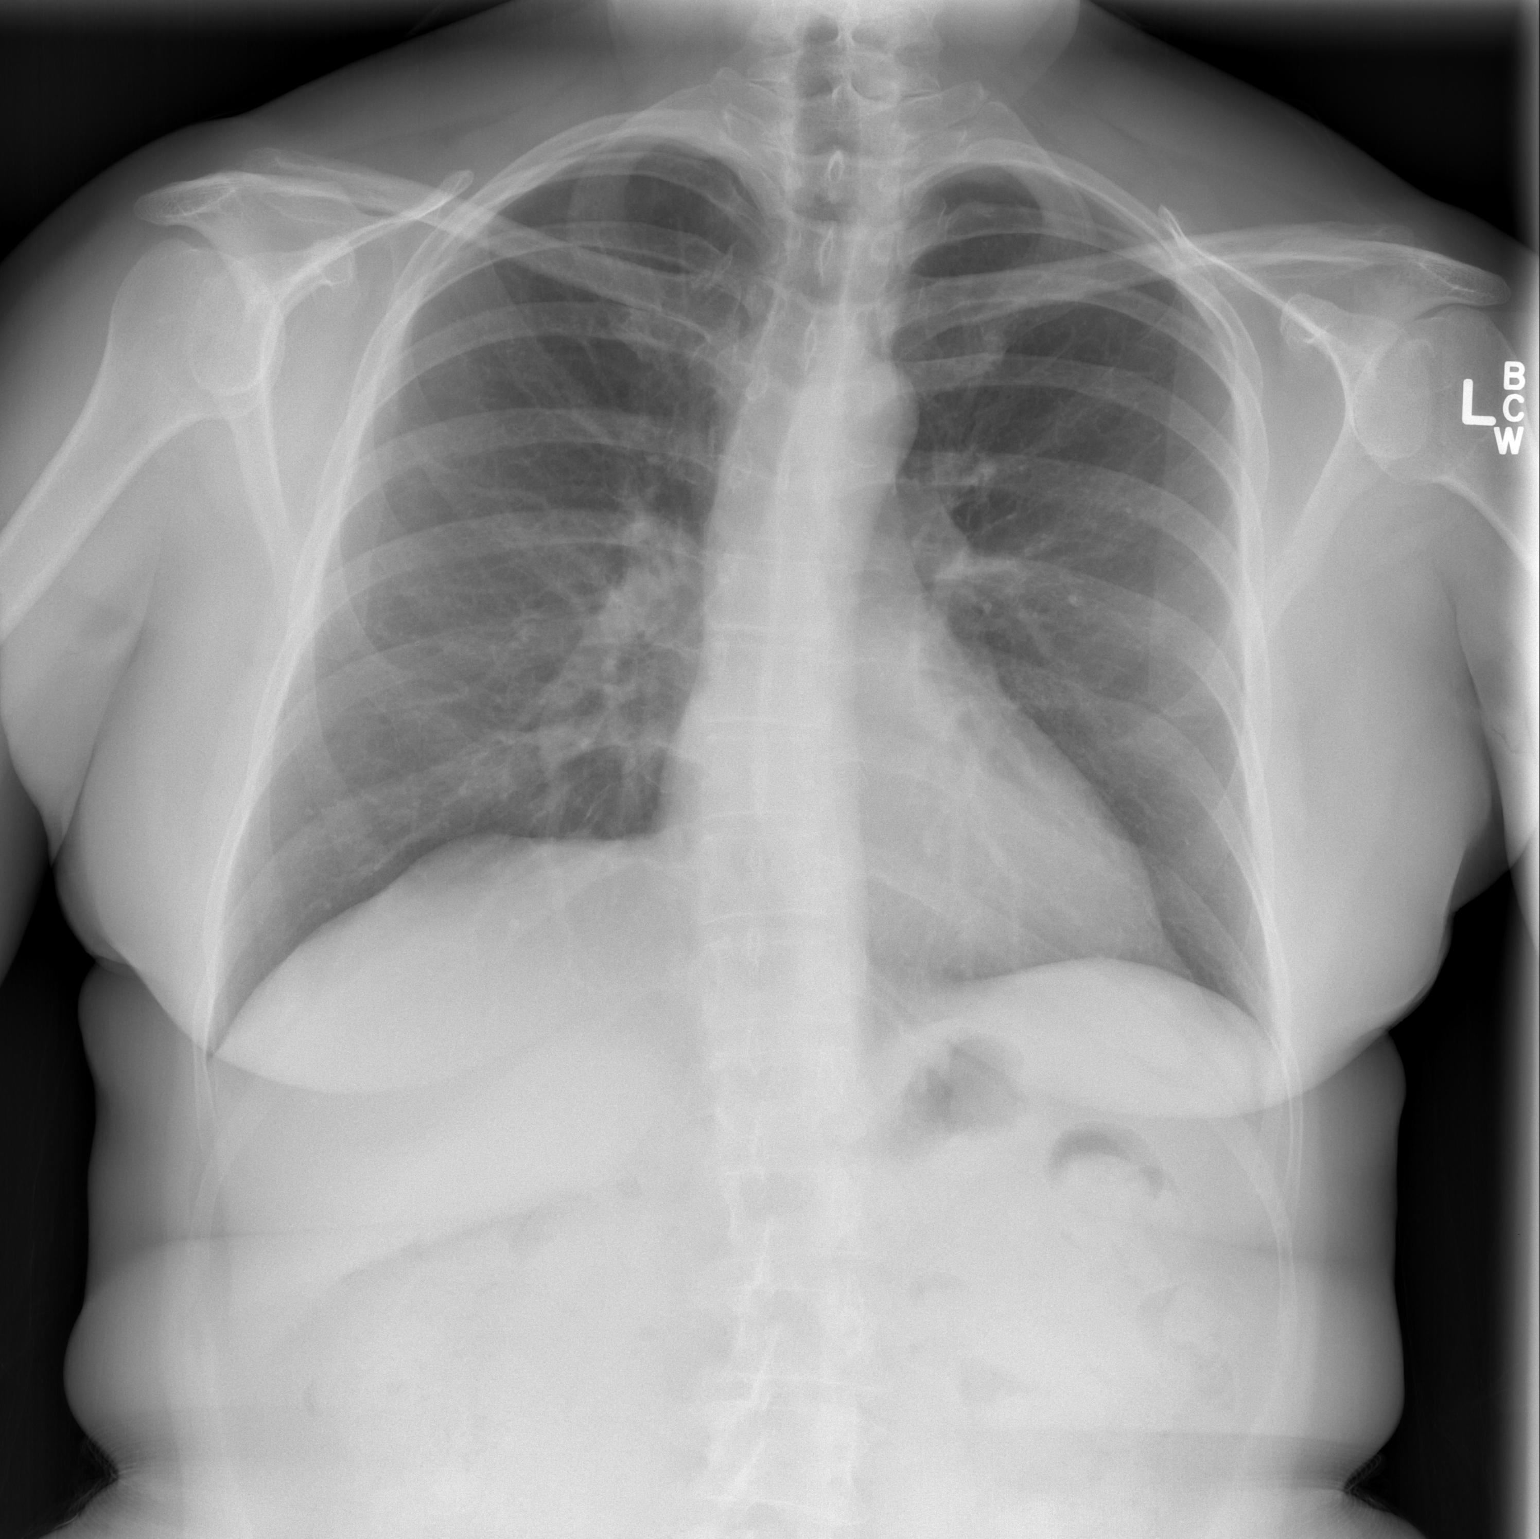

[w chest lat]
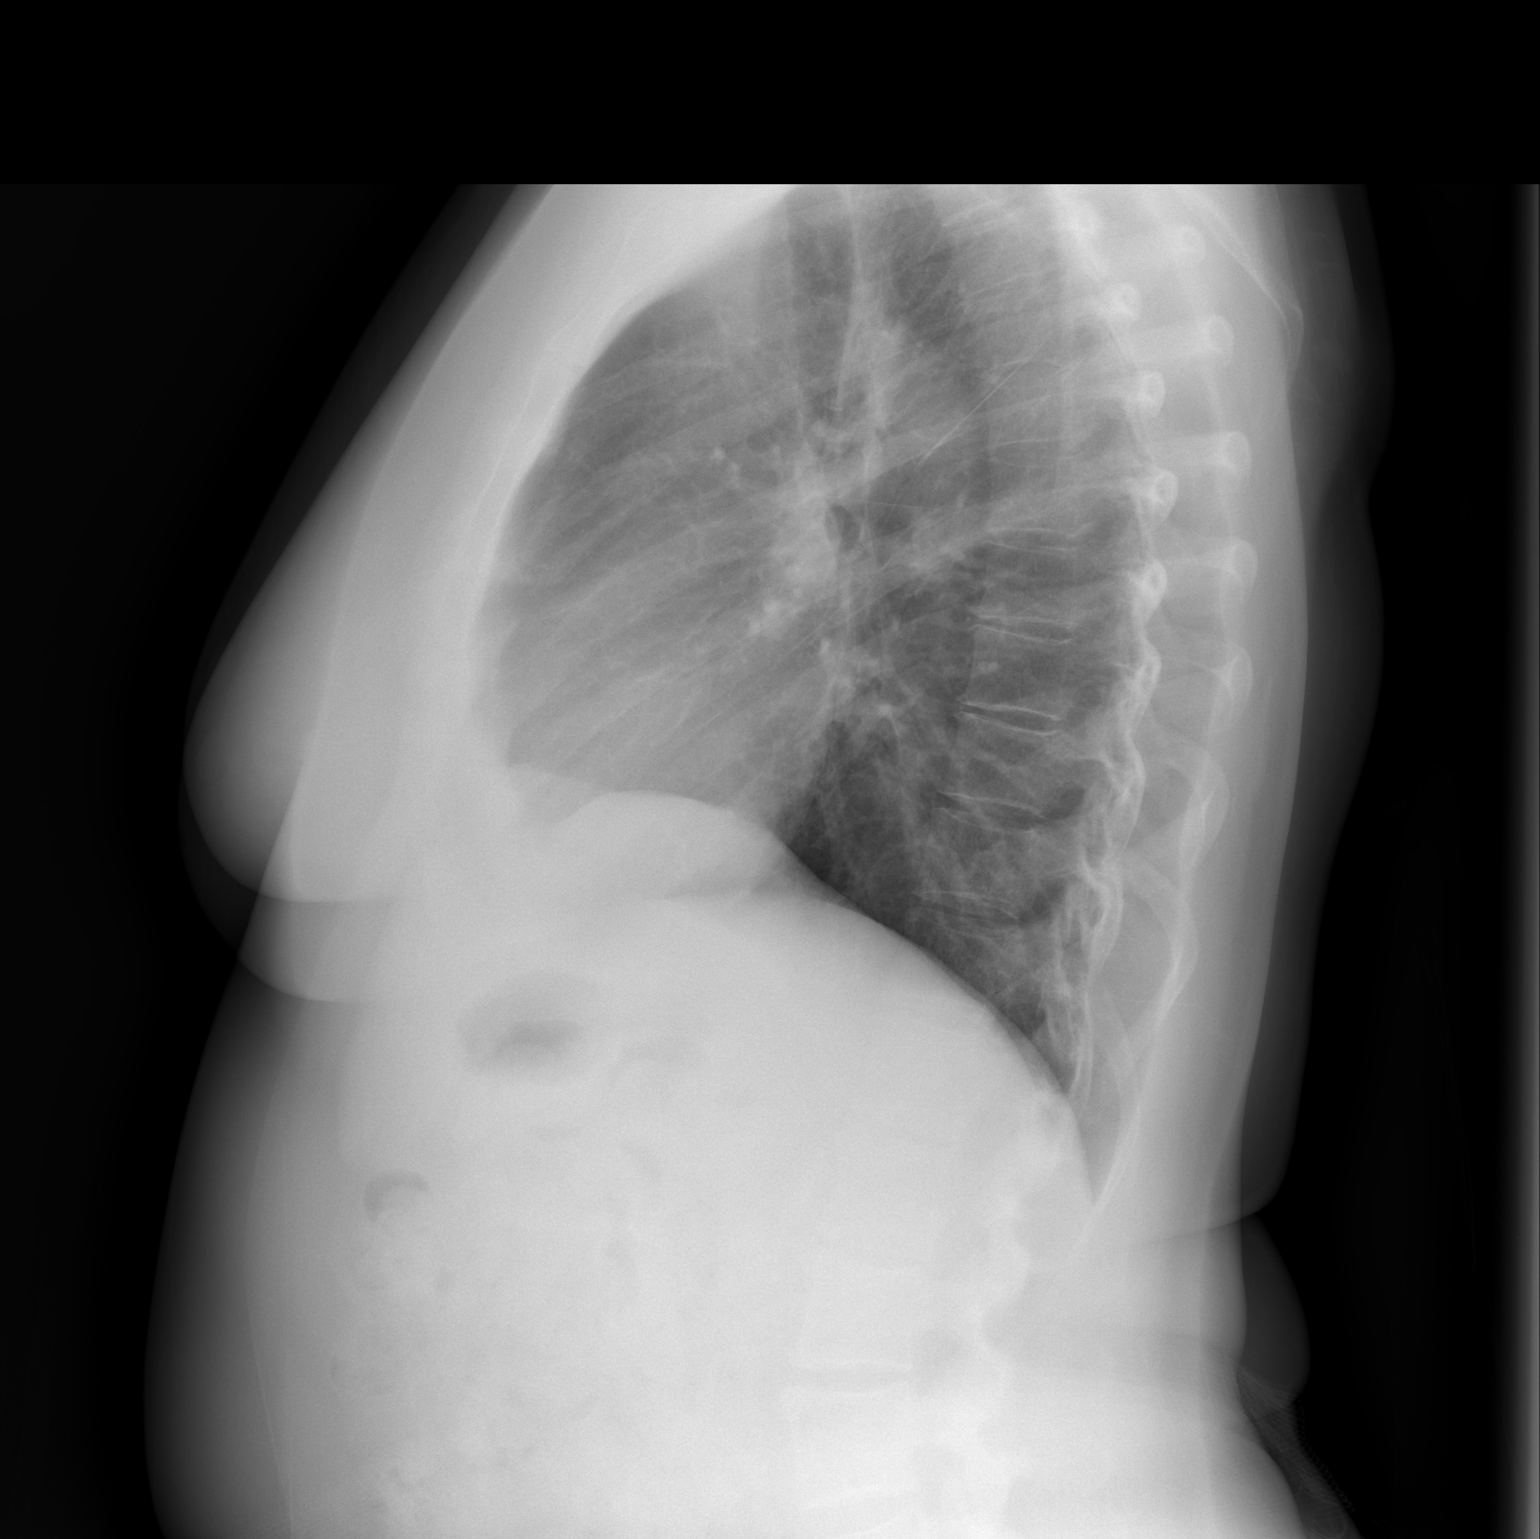

[2 of 2 positions shown; findings below may reference images not displayed]

FINDINGS: The heart size and mediastinal contours are within normal limits.
Both lungs are clear. The visualized skeletal structures are
unremarkable.
IMPRESSION: No active cardiopulmonary disease.

## 2015-01-11 ENCOUNTER — Telehealth: Payer: Self-pay | Admitting: Cardiology

## 2015-01-11 DIAGNOSIS — E7800 Pure hypercholesterolemia, unspecified: Secondary | ICD-10-CM

## 2015-01-11 NOTE — Telephone Encounter (Signed)
New Message  Please put in Orders for labs in April

## 2015-01-11 NOTE — Telephone Encounter (Signed)
Pt requesting orders in advance of appt.

## 2015-01-12 NOTE — Telephone Encounter (Signed)
She needs fasting lab scheduled.  Peter Martinique MD, [facility]

## 2015-01-13 ENCOUNTER — Other Ambulatory Visit: Payer: Self-pay

## 2015-01-13 DIAGNOSIS — E7800 Pure hypercholesterolemia, unspecified: Secondary | ICD-10-CM

## 2015-01-13 NOTE — Addendum Note (Signed)
Addended by: Golden Hurter D on: 01/13/2015 05:27 PM   Modules accepted: Orders

## 2015-01-13 NOTE — Telephone Encounter (Signed)
Returned call to patient no answer.Left message on personal voice mail will mail fasting lab orders.

## 2015-02-11 ENCOUNTER — Encounter: Payer: Self-pay | Admitting: Cardiology

## 2015-03-28 ENCOUNTER — Encounter: Payer: Self-pay | Admitting: Cardiology

## 2015-03-28 ENCOUNTER — Ambulatory Visit (INDEPENDENT_AMBULATORY_CARE_PROVIDER_SITE_OTHER): Payer: 59 | Admitting: Cardiology

## 2015-03-28 VITALS — BP 118/76 | HR 64 | Ht 63.0 in | Wt 178.6 lb

## 2015-03-28 DIAGNOSIS — E7800 Pure hypercholesterolemia, unspecified: Secondary | ICD-10-CM

## 2015-03-28 NOTE — Patient Instructions (Signed)
Continue your current therapy  I will see you in one year   

## 2015-03-28 NOTE — Progress Notes (Signed)
Diane Fox Date of Birth: 05/22/1951 Medical Record #[id number]  History of Present Illness: Seen today for  followup hypercholesterolemia. She has a history of hypercholesterolemia and is on Crestor therapy. Last year she complained of chest pain that she thought was related to work. She had a normal stress Echo in April 2015. She retired in March from the [place] at [employer]. She has active bronchitis treated by primary care.   Current Outpatient Prescriptions on File Prior to Visit  Medication Sig Dispense Refill  . escitalopram (LEXAPRO) 10 MG tablet TAKE 1 TABLET BY MOUTH ONCE DAILY 90 tablet 3  . fexofenadine (ALLEGRA) 180 MG tablet Take 180 mg by mouth daily as needed for allergies.     . rosuvastatin (CRESTOR) 5 MG tablet Take 1 tablet (5 mg total) by mouth at bedtime. 90 tablet 3  . triamterene-hydrochlorothiazide (MAXZIDE) 75-50 MG per tablet Take 1 tablet by mouth every morning.      No current facility-administered medications on file prior to visit.    Allergies  Allergen Reactions  . Biaxin [Clarithromycin] Hives  . Penicillins Hives  . Sulfa Antibiotics Hives  . Zostavax [Zoster Vaccine Live] Hives    Past Medical History  Diagnosis Date  . Hypercholesterolemia   . Atypical chest pain march 2015    april stress test=normal  . Arthritis   . Peri-menopausal     lexapro prn  . Edema     maxide prn  . 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 H/O measles   . H/O mumps   . Asthma     allergy related, no attacks  . Environmental allergies     Past Surgical History  Procedure Laterality Date  . Right oophorectomy Right 11/91    tubal ligation with complications  . Breast bx  09/2002    breast sterootactec biopsy  . Hysteroscopy  2007    polyp removed-benign  . Polypectomy  07/2007    hysteroscopic with removal of polyp Dr. Sabra Heck  . Endometrial biopsy  08/2010    proliferative endo  . Tubal ligation  1991  . Patella-femoral  arthroplasty Left 11/23/2013    Procedure: LEFT KNEE PATELLA-FEMORAL ARTHROPLASTY;  Surgeon: Gearlean Alf, MD;  Location: [facility];  Service: Orthopedics;  Laterality: Left;  . Dilation and curettage of uterus      x2  . Breast surgery Right 10/03    focal atypia hyperplasia  . Wisdom tooth extraction  early 20's  . Colonoscopy    . Total knee arthroplasty Left 07/05/2014    Procedure: LEFT TOTAL KNEE ARTHROPLASTY;  Surgeon: Gearlean Alf, MD;  Location: [facility];  Service: Orthopedics;  Laterality: Left;    History  Smoking status  . Never Smoker   Smokeless tobacco  . Never Used    History  Alcohol Use  . 0.6 oz/week  . 1 Glasses of wine per week    Comment: social    Family History  Problem Relation Age of Onset  . Heart disease Father   . Heart attack Father   . Hypertension Father   . Heart disease Mother   . Hypertension Mother   . Asthma Brother   . Cancer Maternal Grandmother     lymphoma  . Cancer Maternal Grandfather     prostate cancer    Review of Systems: As noted in history of present illness. Positive for cough.  All other systems were reviewed and are negative.  Physical Exam: BP 118/76 mmHg  Pulse 64  Ht 5\' 3"  (1.6 m)  Wt 178 lb 9.6 oz (81.012 kg)  BMI 31.65 kg/m2  LMP 10/17/2010 The patient is alert and oriented x 3. The HEENT exam is normal. The carotids are 2+ without bruits.  There is no thyromegaly.  There is no JVD.  The lungs are clear.  The heart exam reveals a regular rate with a normal S1 and S2.  There are no murmurs, gallops, or rubs.  The PMI is not displaced.     Exam of the legs reveal no clubbing, cyanosis, or edema.  Surgical scar on left knee.  The distal pulses are intact.  Cranial nerves II - XII are intact.  Motor and sensory functions are intact.  The gait is normal.  LABORATORY DATA: ECG today demonstrates normal sinus rhythm with a nonspecific TWA. Rate 64 bpm.I have personally reviewed and interpreted this study.  Lab  Results  Component Value Date   WBC 13.2* 07/07/2014   HGB 12.7 07/07/2014   HCT 37.4 07/07/2014   PLT 322 07/07/2014   GLUCOSE 125* 07/07/2014   CHOL 174 03/02/2014   TRIG 144.0 03/02/2014   HDL 60.90 03/02/2014   LDLCALC 84 03/02/2014   ALT 35 03/02/2014   AST 25 03/02/2014   NA 141 07/07/2014   K 3.9 07/07/2014   CL 101 07/07/2014   CREATININE 0.89 07/07/2014   BUN 18 07/07/2014   CO2 29 07/07/2014   INR 0.94 06/24/2014   Labs dated 02/10/15 reviewed: Normal CBC, Chemistry panel and TSH. Cholesterol- 177, trig- 169, HDL- 58, LDL 85.   Assessment / Plan: 1. Hypercholesterolemia. Well controlled on crestor. Continue Rx.  2. Atypical chest pain. Resolved. Normal stress Echo last year.   I will follow up in one year.

## 2015-03-29 ENCOUNTER — Encounter: Payer: Self-pay | Admitting: Cardiology

## 2015-04-05 ENCOUNTER — Ambulatory Visit (INDEPENDENT_AMBULATORY_CARE_PROVIDER_SITE_OTHER): Payer: 59 | Admitting: Internal Medicine

## 2015-04-05 ENCOUNTER — Encounter: Payer: Self-pay | Admitting: Internal Medicine

## 2015-04-05 VITALS — BP 138/90 | HR 74 | Ht 63.0 in | Wt 179.0 lb

## 2015-04-05 DIAGNOSIS — R05 Cough: Secondary | ICD-10-CM

## 2015-04-05 DIAGNOSIS — R0600 Dyspnea, unspecified: Secondary | ICD-10-CM | POA: Diagnosis not present

## 2015-04-05 DIAGNOSIS — R053 Chronic cough: Secondary | ICD-10-CM

## 2015-04-05 MED ORDER — GABAPENTIN 300 MG PO CAPS: ORAL_CAPSULE | ORAL | Status: DC

## 2015-04-05 NOTE — Patient Instructions (Addendum)
ICD-9-CM ICD-10-CM   1. Chronic cough 786.2 R05   2. Dyspnea 786.09 R06.00    Cough is from possibly sinus drainage, possibly  acid reflux, asthma,  All of this is working together to cause cyclical cough/LPR cough or cough hypersensitivity  #Sinus drainage  - start/continue netti pot daily (nurse will show you picture) - stop allegra - start antihistamine chlorpheniramine 4mg  tablet 1-2 at night; if this makes you too sleepy or dry cut down (nurse will do script) -continue allegra  #Possible Acid Reflux  - take otc zegerid 20mg   1 capsule daily on empty stomach x 6 weeks   -- avoid colas, spices, cheeses, spirits, red meats, beer, chocolates, fried foods etc.,   - sleep with head end of bed elevated  - eat small frequent meals  - do not go to bed for 3 hours after last meal  #asthma  - no evidence of asthma  - stop prednisone  #Cyclical cough - there is defnite evidence of cough hypersensitivity; will Rx this upfront aggressively  - please choose 2-3 days and observe complete voice rest - no talking or whispering - refer Mr Garald Balding voice neuro rehab for evaluation  - at all times there  there is urge to cough, drink water or swallow or sip on throat lozenge - START gabapentin 300mg  once daily x 3 days, then 300mg  twice daily x 3 days, then 300mg  three times daily to continue. If this makes you too sleepy or drowsy call us and we will cut your medication dosing down   #Followup - I will see you in 4-6 weeks.  - RSI cough score at followup  - depending on response CXR v CT chest and +/- CT sinus at followup and PFT test - any problems call or come sooner

## 2015-04-05 NOTE — Progress Notes (Signed)
Subjective:    Patient ID: Diane Fox, female    DOB: November 25, 1951, 64 y.o.   MRN: [id number] PCP Gennette Pac, MD ] HPI   OV 04/05/2015 - ACUTE   Chief Complaint  Patient presents with  . Follow-up    Pt last saw RB in 01/2013 for chronic cough. Pt states she did not return beacuse her cough resolved. COugh is now back. Pt c/o dry cough, hoarseness, SOB x since early march. Pt states she has seen her PCP and nothing has helped.      64 year old female retired from the [employer] at [employer]. She retired early March 2016 and went to [place]. She then developed an abrupt onset of cough that is now persisted for over 6 weeks. She does not recollect any preceding viral illness prior to this. She does not know how exactly the cough started. Since then the cough is persisted and probably worse. It is severe in intensity. It is dry in quality and does not present at night. She is able to sleep well at night. There is no associated wheezing. However that is associated dyspnea on exertion class III levels and relieved by rest. Walking makes cough worse. There is no associated incontinence. She feels the cough is coming from her upper chest. There is associated significant hoarseness of voice and clearing of the throat and a sense of ticklish sensation in the throat. RSI cough score is 22 below and reflects severe cough and irritable larynx syndrome.  Allergy history: She did have allergies in her early life along with asthma but she outgrew those. Last allergy shots was over 20 oh 30 years ago  Sinus history: She admits to some postnasal drainage. She is currently on a bunch of antihistaminics and nasal steroids  Acid reflux: She denies  Pulmonary history: She is been given 2 courses of antibiotics in the last [redacted] weeks along with being on a prednisone taper now for 6 days but this has not helped. One of the antibiotics with Z-Pak. CXR x-ray was in  July 2015 and was clear in our system. FeNO - 9ppb 04/05/2015 for this was done on prednisone which is actually not help the cough. She did have chest x-ray with a primary care physician and apparently some infiltrates was seen but a follow-up chest x-ray on 03/30/2015 this had resolved. I do not have the images with me  Exposure history: She is a nonsmoker   Walking desaturation test 185 feet 3 laps on room air on 04/05/2015: DID NOT DESATURATE   Dr Lorenza Cambridge Reflux Symptom Index (> 13-15 suggestive of LPR cough) 04/05/2015   Hoarseness of problem with voice 4  Clearing  Of Throat 3  Excess throat mucus or feeling of post nasal drip 4  Difficulty swallowing food, liquid or tablets 0  Cough after eating or lying down 0  Breathing difficulties or choking episodes 5  Troublesome or annoying cough 5  Sensation of something sticking in throat or lump in throat 0  Heartburn, chest pain, indigestion, or stomach acid coming up 1  TOTAL 22       has a past medical history of Hypercholesterolemia; Atypical chest pain (march 2015); Arthritis; Peri-menopausal; Edema; Atypical lobular hyperplasia of breast (09/2002); H/O measles; H/O mumps; Asthma; and Environmental allergies.   reports that she has never smoked. She has never used smokeless tobacco.  Past Surgical History  Procedure Laterality Date  . Right oophorectomy Right 11/91  tubal ligation with complications  . Breast bx  09/2002    breast sterootactec biopsy  . Hysteroscopy  2007    polyp removed-benign  . Polypectomy  07/2007    hysteroscopic with removal of polyp Dr. Sabra Heck  . Endometrial biopsy  08/2010    proliferative endo  . Tubal ligation  1991  . Patella-femoral arthroplasty Left 11/23/2013    Procedure: LEFT KNEE PATELLA-FEMORAL ARTHROPLASTY;  Surgeon: Gearlean Alf, MD;  Location: [facility];  Service: Orthopedics;  Laterality: Left;  . Dilation and curettage of uterus      x2  . Breast surgery Right 10/03    focal  atypia hyperplasia  . Wisdom tooth extraction  early 20's  . Colonoscopy    . Total knee arthroplasty Left 07/05/2014    Procedure: LEFT TOTAL KNEE ARTHROPLASTY;  Surgeon: Gearlean Alf, MD;  Location: [facility];  Service: Orthopedics;  Laterality: Left;    Allergies  Allergen Reactions  . Biaxin [Clarithromycin] Hives  . Penicillins Hives  . Sulfa Antibiotics Hives  . Zostavax [Zoster Vaccine Live] Hives    Immunization History  Administered Date(s) Administered  . DTaP 11/16/2006  . Influenza Split 10/17/2012, 09/16/2014  . MMR 12/17/2006  . Varicella 07/06/2011    Family History  Problem Relation Age of Onset  . Heart disease Father   . Heart attack Father   . Hypertension Father   . Heart disease Mother   . Hypertension Mother   . Asthma Brother   . Cancer Maternal Grandmother     lymphoma  . Cancer Maternal Grandfather     prostate cancer     Current outpatient prescriptions:  .  Albuterol Sulfate 108 (90 BASE) MCG/ACT AEPB, Inhale into the lungs as needed., Disp: , Rfl:  .  B Complex-C (B-COMPLEX WITH VITAMIN C) tablet, Take 1 tablet by mouth daily., Disp: , Rfl:  .  Coenzyme Q10 (CO Q 10 PO), Take 1 tablet by mouth daily., Disp: , Rfl:  .  escitalopram (LEXAPRO) 10 MG tablet, TAKE 1 TABLET BY MOUTH ONCE DAILY, Disp: 90 tablet, Rfl: 3 .  fexofenadine (ALLEGRA) 180 MG tablet, Take 180 mg by mouth daily as needed for allergies. , Disp: , Rfl:  .  Flaxseed, Linseed, (GNP FLAXSEED PO), Take 2 tablets by mouth daily., Disp: , Rfl:  .  Multiple Vitamins-Minerals (CENTRUM SILVER PO), Take 1 tablet by mouth daily., Disp: , Rfl:  .  rosuvastatin (CRESTOR) 5 MG tablet, Take 1 tablet (5 mg total) by mouth at bedtime., Disp: 90 tablet, Rfl: 3 .  Triamcinolone Acetonide (NASACORT ALLERGY 24HR NA), Place 1 spray into the nose daily., Disp: , Rfl:  .  triamterene-hydrochlorothiazide (MAXZIDE) 75-50 MG per tablet, Take 1 tablet by mouth every morning. , Disp: , Rfl:  .  vitamin E  1000 UNIT capsule, Take 1,000 Units by mouth daily., Disp: , Rfl:      Review of Systems  Constitutional: Negative for fever and unexpected weight change.  HENT: Negative for congestion, dental problem, ear pain, nosebleeds, postnasal drip, rhinorrhea, sinus pressure, sneezing, sore throat and trouble swallowing.   Eyes: Negative for redness and itching.  Respiratory: Positive for cough and shortness of breath. Negative for chest tightness and wheezing.   Cardiovascular: Negative for palpitations and leg swelling.  Gastrointestinal: Negative for nausea and vomiting.  Genitourinary: Negative for dysuria.  Musculoskeletal: Negative for joint swelling.  Skin: Negative for rash.  Neurological: Negative for headaches.  Hematological: Does not bruise/bleed easily.  Psychiatric/Behavioral: Negative for dysphoric mood. The patient is not nervous/anxious.        Objective:   Physical Exam  Constitutional: She is oriented to person, place, and time. She appears well-developed and well-nourished. No distress.  HENT:  Head: Normocephalic and atraumatic.  Right Ear: External ear normal.  Left Ear: External ear normal.  Mouth/Throat: Oropharynx is clear and moist. No oropharyngeal exudate.  Really hoarse voice Severe laryngeal cough  Eyes: Conjunctivae and EOM are normal. Pupils are equal, round, and reactive to light. Right eye exhibits no discharge. Left eye exhibits no discharge. No scleral icterus.  Neck: Normal range of motion. Neck supple. No JVD present. No tracheal deviation present. No thyromegaly present.  Cardiovascular: Normal rate, regular rhythm, normal heart sounds and intact distal pulses.  Exam reveals no gallop and no friction rub.   No murmur heard. Pulmonary/Chest: Effort normal and breath sounds normal. No respiratory distress. She has no wheezes. She has no rales. She exhibits no tenderness.  Abdominal: Soft. Bowel sounds are normal. She exhibits no distension and no mass.  There is no tenderness. There is no rebound and no guarding.  Musculoskeletal: Normal range of motion. She exhibits no edema or tenderness.  Lymphadenopathy:    She has no cervical adenopathy.  Neurological: She is alert and oriented to person, place, and time. She has normal reflexes. No cranial nerve deficit. She exhibits normal muscle tone. Coordination normal.  Skin: Skin is warm and dry. No rash noted. She is not diaphoretic. No erythema. No pallor.  Psychiatric: She has a normal mood and affect. Her behavior is normal. Judgment and thought content normal.  Vitals reviewed.   Filed Vitals:   04/05/15 1042  BP: 138/90  Pulse: 74  Height: 5' 3"  (1.6 m)  Weight: 179 lb (81.194 kg)  SpO2: 93%            ICD-9-CM ICD-10-CM   1. Chronic cough 786.2 R05   2. Dyspnea 786.09 R06.00    Assessment & Plan:    Cough is from possibly sinus drainage, possibly  acid reflux, asthma,  -  All of this is working together to cause cyclical cough/LPR cough or cough hypersensitivity  Shortness of Breath   - likely symptom feature of excess coug  PLAN  #Sinus drainage  - start/continue netti pot daily (nurse will show you picture) - stop allegra - start antihistamine chlorpheniramine 64m tablet 1-2 at night; if this makes you too sleepy or dry cut down (nurse will do script) -continue allegra  #Possible Acid Reflux  - take otc zegerid 253m 1 capsule daily on empty stomach x 6 weeks   -- avoid colas, spices, cheeses, spirits, red meats, beer, chocolates, fried foods etc.,   - sleep with head end of bed elevated  - eat small frequent meals  - do not go to bed for 3 hours after last meal  #asthma  - no evidence of asthma based on Exhaled NO test  - stop prednisone  #Cyclical cough - there is defnite evidence of cough hypersensitivity; will Rx this upfront aggressively  - please choose 2-3 days and observe complete voice rest - no talking or whispering - refer Mr CaGarald Baldingvoice neuro rehab for evaluation  - at all times there  there is urge to cough, drink water or swallow or sip on throat lozenge - START gabapentin 30042mnce daily x 3 days, then 300m60mice daily x 3 days, then 300mg7mee times daily to  continue. If this makes you too sleepy or drowsy call us and we will cut your medication dosing down   #Followup - I will see you in 4-6 weeks.  - RSI cough score at followup  - depending on response : especially if residual cough persists: get  CT chest and CT sinus at followup and PFT test and even consider echo - any problems call or come sooner    Dr. Brand Males, M.D., [facility].C.P Pulmonary and Critical Care Medicine Staff Physician [facility] Pager: [phone], If no answer or between  15:00h - 7:00h: call [phone]  04/05/2015 11:30 AM

## 2015-04-07 ENCOUNTER — Telehealth: Payer: Self-pay | Admitting: Internal Medicine

## 2015-04-07 MED ORDER — GABAPENTIN 100 MG PO CAPS: 100.0000 mg | ORAL_CAPSULE | Freq: Every day | ORAL | Status: DC

## 2015-04-07 NOTE — Telephone Encounter (Signed)
Rx has been sent in. Pt is aware of MR's recommendation. Nothing further was needed.

## 2015-04-07 NOTE — Telephone Encounter (Signed)
Cut down to 100mg  once day at night and call back in 2-5 days if still feelign same

## 2015-04-07 NOTE — Telephone Encounter (Signed)
Patient says that she has taken the Gabapentin for 2 days.  Her cough is better, but the medication is making her very sleepy, drunk feeling.  She is only taking 1 pill daily (300mg ) and was told to increase it, she says she does not feel comfortable taking it, she says she is afraid to drive.  MR - please advise.

## 2015-05-10 ENCOUNTER — Ambulatory Visit (INDEPENDENT_AMBULATORY_CARE_PROVIDER_SITE_OTHER): Payer: 59 | Admitting: Internal Medicine

## 2015-05-10 ENCOUNTER — Encounter: Payer: Self-pay | Admitting: Internal Medicine

## 2015-05-10 VITALS — BP 118/82 | HR 79 | Ht 63.0 in | Wt 178.0 lb

## 2015-05-10 DIAGNOSIS — R05 Cough: Secondary | ICD-10-CM

## 2015-05-10 DIAGNOSIS — R053 Chronic cough: Secondary | ICD-10-CM

## 2015-05-10 NOTE — Patient Instructions (Addendum)
ICD-9-CM ICD-10-CM   1. Chronic cough 786.2 R05    Glad you are significantly better and cough is resolved.  Continue all below measures as before till next visit; basically no change in treatment plan  #Sinus drainage  - continue netti pot daily  - cotninue  antihistamine chlorpheniramine 4mg  tablet 1-2 at night; if this makes you too sleepy or dry cut down   #Possible Acid Reflux  -continue otc zegerid 20mg   1 capsule daily on empty stomach x 6 weeks   -- avoid colas, spices, cheeses, spirits, red meats, beer, chocolates, fried foods etc.,   - sleep with head end of bed elevated  - eat small frequent meals  - do not go to bed for 3 hours after last meal  #Cyclical cough - Continue gabapentin 100mg  once daily at night  #Followup - hold off testing at this stge - I will see you in 6 weeks; if improved will start winding down therapies - RSI cough score at followup

## 2015-05-10 NOTE — Progress Notes (Signed)
Subjective:    Patient ID: Diane Fox, female    DOB: Nov 26, 1951, 64 y.o.   MRN: [id number]  HPI  PCP Gennette Pac, MD ] HPI   OV 04/05/2015 - ACUTE   Chief Complaint  Patient presents with  . Follow-up    Pt last saw RB in 01/2013 for chronic cough. Pt states she did not return beacuse her cough resolved. COugh is now back. Pt c/o dry cough, hoarseness, SOB x since early march. Pt states she has seen her PCP and nothing has helped.      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 She retired early March 2016 and went to [place]. She then developed an abrupt onset of cough that is now persisted for over 6 weeks. She does not recollect any preceding viral illness prior to this. She does not know how exactly the cough started. Since then the cough is persisted and probably worse. It is severe in intensity. It is dry in quality and does not present at night. She is able to sleep well at night. There is no associated wheezing. However that is associated dyspnea on exertion class III levels and relieved by rest. Walking makes cough worse. There is no associated incontinence. She feels the cough is coming from her upper chest. There is associated significant hoarseness of voice and clearing of the throat and a sense of ticklish sensation in the throat. RSI cough score is 22 below and reflects severe cough and irritable larynx syndrome.  Allergy history: She did have allergies in her early life along with asthma but she outgrew those. Last allergy shots was over 20 oh 30 years ago  Sinus history: She admits to some postnasal drainage. She is currently on a bunch of antihistaminics and nasal steroids  Acid reflux: She denies  Pulmonary history: She is been given 2 courses of antibiotics in the last [redacted] weeks along with being on a prednisone taper now for 6 days but this has not helped. One of the antibiotics with Z-Pak. CXR x-ray  was in July 2015 and was clear in our system. FeNO - 9ppb 04/05/2015 for this was done on prednisone which is actually not help the cough. She did have chest x-ray with a primary care physician and apparently some infiltrates was seen but a follow-up chest x-ray on 03/30/2015 this had resolved. I do not have the images with me  Exposure history: She is a nonsmoker   Walking desaturation test 185 feet 3 laps on room air on 04/05/2015: DID NOT DESATURATE      OV 05/10/2015  Chief Complaint  Patient presents with  . Follow-up    Pt stated her cough has much imporved. Pt stated she has an occassonal cough but the deep cough has resolved. Pt denies mucus production. Pt dneies CP/tightness.    Follow-up chronic cough  - Cough is significantly improved over 75%. On RSI cough score it has improved from 22  To  7. She attributes this improvement to gabapentin, anti-histamine chlorpheniramine, acid reflux therapy. She was not able to tolerate high-dose of gabapentin. She was unable to tolerate 100 mg once at night. This seems to work well for her. She is quite happy with the level of improvement. She does not want any testing. She continues to battle sinus drainage. She started to make decisions for long-term prevention of chronic cough.  For sinus: She is currently doing chlorpheniramine at night and MediPort For acid  reflux: She is doing Zegerid and strict diet For irritable larynx/cough hypersensitivity: She is doing gabapentin 100 mg at night    Dr Lorenza Cambridge Reflux Symptom Index (> 13-15 suggestive of LPR cough) 04/05/2015  05/10/2015   Hoarseness of problem with voice 4 0  Clearing  Of Throat 3 2  Excess throat mucus or feeling of post nasal drip 4 3  Difficulty swallowing food, liquid or tablets 0 0  Cough after eating or lying down 0 1  Breathing difficulties or choking episodes 5 0  Troublesome or annoying cough 5 1  Sensation of something sticking in throat or lump in throat 0 0    Heartburn, chest pain, indigestion, or stomach acid coming up 1 0  TOTAL 22 7     Current outpatient prescriptions:  .  B Complex-C (B-COMPLEX WITH VITAMIN C) tablet, Take 1 tablet by mouth daily., Disp: , Rfl:  .  chlorpheniramine (CHLOR-TRIMETON) 4 MG tablet, Take 4 mg by mouth 2 (two) times daily as needed for allergies., Disp: , Rfl:  .  Coenzyme Q10 (CO Q 10 PO), Take 1 tablet by mouth daily., Disp: , Rfl:  .  escitalopram (LEXAPRO) 10 MG tablet, TAKE 1 TABLET BY MOUTH ONCE DAILY, Disp: 90 tablet, Rfl: 3 .  Flaxseed, Linseed, (GNP FLAXSEED PO), Take 2 tablets by mouth daily., Disp: , Rfl:  .  gabapentin (NEURONTIN) 100 MG capsule, Take 1 capsule (100 mg total) by mouth at bedtime., Disp: 30 capsule, Rfl: 5 .  Multiple Vitamins-Minerals (CENTRUM SILVER PO), Take 1 tablet by mouth daily., Disp: , Rfl:  .  omeprazole-sodium bicarbonate (ZEGERID) 40-1100 MG per capsule, Take 1 capsule by mouth daily before breakfast., Disp: , Rfl:  .  rosuvastatin (CRESTOR) 5 MG tablet, Take 1 tablet (5 mg total) by mouth at bedtime., Disp: 90 tablet, Rfl: 3 .  triamterene-hydrochlorothiazide (MAXZIDE) 75-50 MG per tablet, Take 1 tablet by mouth every morning. , Disp: , Rfl:  .  vitamin E 1000 UNIT capsule, Take 1,000 Units by mouth daily., Disp: , Rfl:    Review of Systems  Constitutional: Negative for fever and unexpected weight change.  HENT: Negative for congestion, dental problem, ear pain, nosebleeds, postnasal drip, rhinorrhea, sinus pressure, sneezing, sore throat and trouble swallowing.   Eyes: Negative for redness and itching.  Respiratory: Positive for cough. Negative for chest tightness, shortness of breath and wheezing.   Cardiovascular: Negative for palpitations and leg swelling.  Gastrointestinal: Negative for nausea and vomiting.  Genitourinary: Negative for dysuria.  Musculoskeletal: Negative for joint swelling.  Skin: Negative for rash.  Neurological: Negative for headaches.   Hematological: Does not bruise/bleed easily.  Psychiatric/Behavioral: Negative for dysphoric mood. The patient is not nervous/anxious.        Objective:   Physical Exam  Constitutional: She is oriented to person, place, and time. She appears well-developed and well-nourished. No distress.  HENT:  Head: Normocephalic and atraumatic.  Right Ear: External ear normal.  Left Ear: External ear normal.  Mouth/Throat: Oropharynx is clear and moist. No oropharyngeal exudate.  Postnasal drainage present  Eyes: Conjunctivae and EOM are normal. Pupils are equal, round, and reactive to light. Right eye exhibits no discharge. Left eye exhibits no discharge. No scleral icterus.  Neck: Normal range of motion. Neck supple. No JVD present. No tracheal deviation present. No thyromegaly present.  Cardiovascular: Normal rate, regular rhythm, normal heart sounds and intact distal pulses.  Exam reveals no gallop and no friction rub.   No murmur heard.  Pulmonary/Chest: Effort normal and breath sounds normal. No respiratory distress. She has no wheezes. She has no rales. She exhibits no tenderness.  Abdominal: Soft. Bowel sounds are normal. She exhibits no distension and no mass. There is no tenderness. There is no rebound and no guarding.  Musculoskeletal: Normal range of motion. She exhibits no edema or tenderness.  Lymphadenopathy:    She has no cervical adenopathy.  Neurological: She is alert and oriented to person, place, and time. She has normal reflexes. No cranial nerve deficit. She exhibits normal muscle tone. Coordination normal.  Skin: Skin is warm and dry. No rash noted. She is not diaphoretic. No erythema. No pallor.  Psychiatric: She has a normal mood and affect. Her behavior is normal. Judgment and thought content normal.  Vitals reviewed.   Filed Vitals:   05/10/15 1040  BP: 118/82  Pulse: 79  Height: 5\' 3"  (1.6 m)  Weight: 178 lb (80.74 kg)  SpO2: 96%          Assessment & Plan:      ICD-9-CM ICD-10-CM   1. Chronic cough 786.2 R05    Glad you are significantly better and cough is resolved.  Continue all below measures as before till next visit; basically no change in treatment plan  #Sinus drainage  - continue netti pot daily  - cotninue  antihistamine chlorpheniramine 4mg  tablet 1-2 at night; if this makes you too sleepy or dry cut down   #Possible Acid Reflux  -continue otc zegerid 20mg   1 capsule daily on empty stomach x 6 weeks   -- avoid colas, spices, cheeses, spirits, red meats, beer, chocolates, fried foods etc.,   - sleep with head end of bed elevated  - eat small frequent meals  - do not go to bed for 3 hours after last meal  #Cyclical cough - Continue gabapentin 100mg  once daily at night  #Followup - hold off testing at this stge - I will see you in 6 weeks; if improved will start winding down therapies - RSI cough score at followu  (> 50% of this 15 min visit spent in face to face counseling or/and coordination of care)   Dr. Brand Males, M.D., [facility].C.P Pulmonary and Critical Care Medicine Staff Physician [place] [facility] Pager: [phone], If no answer or between  15:00h - 7:00h: call [phone]  05/10/2015 11:07 AM

## 2015-05-25 ENCOUNTER — Other Ambulatory Visit: Payer: Self-pay | Admitting: Cardiology

## 2015-05-25 NOTE — Telephone Encounter (Signed)
Rx(s) sent to pharmacy electronically.  

## 2015-06-23 ENCOUNTER — Encounter: Payer: Self-pay | Admitting: Internal Medicine

## 2015-06-23 ENCOUNTER — Telehealth: Payer: Self-pay | Admitting: Internal Medicine

## 2015-06-23 ENCOUNTER — Ambulatory Visit (INDEPENDENT_AMBULATORY_CARE_PROVIDER_SITE_OTHER): Payer: 59 | Admitting: Internal Medicine

## 2015-06-23 VITALS — BP 110/72 | HR 68 | Ht 63.0 in | Wt 176.0 lb

## 2015-06-23 DIAGNOSIS — R053 Chronic cough: Secondary | ICD-10-CM

## 2015-06-23 DIAGNOSIS — R05 Cough: Secondary | ICD-10-CM | POA: Diagnosis not present

## 2015-06-23 NOTE — Telephone Encounter (Signed)
Saw MR today. Was told to follow up in 6 weeks. Only wants to see MR not TP.  MR - please advise where we can place pt. Thanks.

## 2015-06-23 NOTE — Progress Notes (Signed)
Subjective:    Patient ID: Diane Fox, female    DOB: [birth year], 64 y.o.   MRN: [id number]  HPI    PCP Gennette Pac, MD ] HPI   OV 04/05/2015 - ACUTE   Chief Complaint  Patient presents with  . Follow-up    Pt last saw RB in 01/2013 for chronic cough. Pt states she did not return beacuse her cough resolved. COugh is now back. Pt c/o dry cough, hoarseness, SOB x since early march. Pt states she has seen her PCP and nothing has helped.      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 She then developed an abrupt onset of cough that is now persisted for over 6 weeks. She does not recollect any preceding viral illness prior to this. She does not know how exactly the cough started. Since then the cough is persisted and probably worse. It is severe in intensity. It is dry in quality and does not present at night. She is able to sleep well at night. There is no associated wheezing. However that is associated dyspnea on exertion class III levels and relieved by rest. Walking makes cough worse. There is no associated incontinence. She feels the cough is coming from her upper chest. There is associated significant hoarseness of voice and clearing of the throat and a sense of ticklish sensation in the throat. RSI cough score is 22 below and reflects severe cough and irritable larynx syndrome.  Allergy history: She did have allergies in her early life along with asthma but she outgrew those. Last allergy shots was over 20 oh 30 years ago  Sinus history: She admits to some postnasal drainage. She is currently on a bunch of antihistaminics and nasal steroids  Acid reflux: She denies  Pulmonary history: She is been given 2 courses of antibiotics in the last [redacted] weeks along with being on a prednisone taper now for 6 days but this has not helped. One of the antibiotics with Z-Pak. CXR  x-ray was in July 2015 and was clear in our system. FeNO - 9ppb 04/05/2015 for this was done on prednisone which is actually not help the cough. She did have chest x-ray with a primary care physician and apparently some infiltrates was seen but a follow-up chest x-ray on 03/30/2015 this had resolved. I do not have the images with me  Exposure history: She is a nonsmoker   Walking desaturation test 185 feet 3 laps on room air on 04/05/2015: DID NOT DESATURATE      OV 05/10/2015  Chief Complaint  Patient presents with  . Follow-up    Pt stated her cough has much imporved. Pt stated she has an occassonal cough but the deep cough has resolved. Pt denies mucus production. Pt dneies CP/tightness.    Follow-up chronic cough  - Cough is significantly improved over 75%. On RSI cough score it has improved from 22  To  7. She attributes this improvement to gabapentin, anti-histamine chlorpheniramine, acid reflux therapy. She was not able to tolerate high-dose of gabapentin. She was able to tolerate 100 mg once at night. This seems to work well for her. She is quite happy with the level of improvement. She does not want any testing. She continues to battle sinus drainage. She started to make decisions for long-term prevention of chronic cough.  For sinus: She is currently doing chlorpheniramine at night and MediPort  For acid reflux: She is doing Zegerid and strict diet For irritable larynx/cough hypersensitivity: She is doing gabapentin 100 mg at night     OV 06/23/2015  Chief Complaint  Patient presents with  . Follow-up    Pt states she is having more frequent episodes of coughing since last OV. Pt stated she had a productive cough with yellow mucus x 1 day. Pt denies SOB and CP/tightness.    Follow-up chronic cough. This visit was to see her cough is further improved and start winding no therapies. However on the other hand cough is marginally worse. RSI cough score is 9. Yesterday she then  had a little phlegm times once no fever or wheezing or chest pain or chills. She's not interested in anabiotic and prednisone course. At this point in time regardless of the persistence of cough she wants to wean off gabapentin. She's not interested in testing. However if the cough worsens after gabapentin comes off then she would be interested in phase 2 of testing    Dr Lorenza Cambridge Reflux Symptom Index (> 13-15 suggestive of LPR cough) 04/05/2015  05/10/2015  06/23/2015   Hoarseness of problem with voice 4 0 0  Clearing  Of Throat 3 2 2   Excess throat mucus or feeling of post nasal drip 4 3 2   Difficulty swallowing food, liquid or tablets 0 0 0  Cough after eating or lying down 0 1 2  Breathing difficulties or choking episodes 5 0 0  Troublesome or annoying cough 5 1 2   Sensation of something sticking in throat or lump in throat 0 0 0  Heartburn, chest pain, indigestion, or stomach acid coming up 1 0 1  TOTAL 22 7 9     Current outpatient prescriptions:  .  B Complex-C (B-COMPLEX WITH VITAMIN C) tablet, Take 1 tablet by mouth daily., Disp: , Rfl:  .  chlorpheniramine (CHLOR-TRIMETON) 4 MG tablet, Take 4 mg by mouth daily as needed for allergies. , Disp: , Rfl:  .  Coenzyme Q10 (CO Q 10 PO), Take 1 tablet by mouth daily., Disp: , Rfl:  .  CRESTOR 5 MG tablet, TAKE 1 TABLET BY MOUTH AT BEDTIME., Disp: 90 tablet, Rfl: 3 .  escitalopram (LEXAPRO) 10 MG tablet, TAKE 1 TABLET BY MOUTH ONCE DAILY, Disp: 90 tablet, Rfl: 3 .  gabapentin (NEURONTIN) 100 MG capsule, Take 1 capsule (100 mg total) by mouth at bedtime., Disp: 30 capsule, Rfl: 5 .  Multiple Vitamins-Minerals (CENTRUM SILVER PO), Take 1 tablet by mouth daily., Disp: , Rfl:  .  omeprazole-sodium bicarbonate (ZEGERID) 40-1100 MG per capsule, Take 1 capsule by mouth daily before breakfast., Disp: , Rfl:  .  triamterene-hydrochlorothiazide (MAXZIDE) 75-50 MG per tablet, Take 1 tablet by mouth every morning. , Disp: , Rfl:  .  vitamin E (VITAMIN  E) 400 UNIT capsule, Take 400 Units by mouth daily., Disp: , Rfl:    Review of Systems  Constitutional: Negative for fever and unexpected weight change.  HENT: Negative for congestion, dental problem, ear pain, nosebleeds, postnasal drip, rhinorrhea, sinus pressure, sneezing, sore throat and trouble swallowing.   Eyes: Negative for redness and itching.  Respiratory: Positive for cough. Negative for chest tightness, shortness of breath and wheezing.   Cardiovascular: Negative for palpitations and leg swelling.  Gastrointestinal: Negative for nausea and vomiting.  Genitourinary: Negative for dysuria.  Musculoskeletal: Negative for joint swelling.  Skin: Negative for rash.  Neurological: Negative for headaches.  Hematological: Does not bruise/bleed easily.  Psychiatric/Behavioral: Negative  for dysphoric mood. The patient is not nervous/anxious.        Objective:   Physical Exam  Filed Vitals:   06/23/15 1443  BP: 110/72  Pulse: 68  Height: 5\' 3"  (1.6 m)  Weight: 176 lb (79.833 kg)  SpO2: 98%   HEENT: Post nasal drip + RESTP:  occ laryngeal cough +. Clear to auscultation bilaterally Gen: Looks well CVS: S1S2+. RRR. No murmurs PA: soft MSK: no cyanosis, no clubbing no edema Neuro: AxOx3 Skin: looks intact       Assessment & Plan:     ICD-9-CM ICD-10-CM   1. Chronic cough 786.2 R05     Glad you are significantly better from first visit but frustrating not fully  Resolved and possibley marginally worse comapred to 2nd visit may 2016  PLAN Continue sinus and acid reflux measures REspect desire to stop gabapentin  - Continue gabapentin 100mg  once daily at night on MOnday , Wed Friday next week  - Then, Monday and Friday following week  - Then Wednesday folowing week - Then stop  #Followup - hold off testing at this stge - I will see you in 6 - 8  weeks; if  Cough recurs persists, then move to phase 2 which will involve tests for asthma, ct sinus and ct  chest   Dr. Brand Males, M.D., [facility].C.P Pulmonary and Critical Care Medicine Staff Physician [place] [facility] Pager: [phone], If no answer or between  15:00h - 7:00h: call [phone]  06/23/2015 3:29 PM

## 2015-06-23 NOTE — Patient Instructions (Addendum)
ICD-9-CM ICD-10-CM   1. Chronic cough 786.2 R05    Glad you are significantly better from firs visit but frustrating not fully  resolved.  Continue sinus and acid reflux measures REspect desire to stop gabapentin  - Continue gabapentin 100mg  once daily at night on MOnday , Wed Friday next week  - Then, Monday and Friday following week  - Then Wednesday folowing week - Then stop  #Followup - hold off testing at this stge - I will see you in 6 weeks; if  Cough recurs persists, then move to phase 2 which will involve tests for asthma, ct sinus and ct chest

## 2015-06-23 NOTE — Telephone Encounter (Signed)
Pt is aware of the below. Recall has been placed. Nothing further was needed.

## 2015-06-23 NOTE — Telephone Encounter (Signed)
See me back in 8 weeks 

## 2015-06-27 ENCOUNTER — Ambulatory Visit: Payer: 59 | Admitting: Internal Medicine

## 2015-07-05 ENCOUNTER — Ambulatory Visit (INDEPENDENT_AMBULATORY_CARE_PROVIDER_SITE_OTHER): Payer: 59 | Admitting: Nurse Practitioner

## 2015-07-05 ENCOUNTER — Encounter: Payer: Self-pay | Admitting: Nurse Practitioner

## 2015-07-05 VITALS — BP 122/76 | HR 60 | Ht 63.0 in | Wt 173.0 lb

## 2015-07-05 DIAGNOSIS — Z0000 Encounter for general adult medical examination without abnormal findings: Secondary | ICD-10-CM

## 2015-07-05 DIAGNOSIS — E2839 Other primary ovarian failure: Secondary | ICD-10-CM | POA: Diagnosis not present

## 2015-07-05 DIAGNOSIS — Z01419 Encounter for gynecological examination (general) (routine) without abnormal findings: Secondary | ICD-10-CM | POA: Diagnosis not present

## 2015-07-05 NOTE — Patient Instructions (Signed)

## 2015-07-05 NOTE — Progress Notes (Signed)
Patient ID: Diane Fox, female   DOB: July 19, 1951, 64 y.o.   MRN: [id number] 63 y.o. [id number] Married  Caucasian Fe here for annual exam.  For chronic cough was given Neurontin.  Will be tapering off.  Left knee total replacement 07/07/14 and doing well. She is now retired as of March and enjoys her time.  Patient's last menstrual period was 10/17/2010 (approximate).          Sexually active: No.  The current method of family planning is post menopausal status.    Exercising: Yes.    walking and water aerobics Smoker:  no  Health Maintenance: Pap:  05/27/12, Negative  MMG:  11/10/14, 3D, Bi-Rads 1:  Negative  Colonoscopy:  2012, normal, repeat in 10 years; Buccini BMD:  10/27/10, T-Score -0.5 S/-1.5 L TDaP:  11/16/06 Shingles: age 55 Labs:  PCP 01/2015 in EPIC  Urine:  Negative    reports that she has never smoked. She has never used smokeless tobacco. She reports that she drinks about 0.6 oz of alcohol per week. She reports that she does not use illicit drugs.  Past Medical History  Diagnosis Date  . Hypercholesterolemia   . Atypical chest pain march 2015    april stress test=normal  . Arthritis   . Peri-menopausal     lexapro prn  . Edema     maxide prn  . 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 H/O measles   . H/O mumps   . Asthma     allergy related, no attacks  . Environmental allergies     Past Surgical History  Procedure Laterality Date  . Right oophorectomy Right 11/91    tubal ligation with complications  . Breast bx  09/2002    breast sterootactec biopsy  . Hysteroscopy  2007    polyp removed-benign  . Polypectomy  07/2007    hysteroscopic with removal of polyp Dr. Sabra Heck  . Endometrial biopsy  08/2010    proliferative endo  . Tubal ligation  1991  . Patella-femoral arthroplasty Left 11/23/2013    Procedure: LEFT KNEE PATELLA-FEMORAL ARTHROPLASTY;  Surgeon: Gearlean Alf, MD;  Location: [facility];  Service: Orthopedics;  Laterality: Left;  .  Dilation and curettage of uterus      x2  . Breast surgery Right 10/03    focal atypia hyperplasia  . Wisdom tooth extraction  early 20's  . Colonoscopy    . Total knee arthroplasty Left 07/05/2014    Procedure: LEFT TOTAL KNEE ARTHROPLASTY;  Surgeon: Gearlean Alf, MD;  Location: [facility];  Service: Orthopedics;  Laterality: Left;    Current Outpatient Prescriptions  Medication Sig Dispense Refill  . B Complex-C (B-COMPLEX WITH VITAMIN C) tablet Take 1 tablet by mouth daily.    . chlorpheniramine (CHLOR-TRIMETON) 4 MG tablet Take 4 mg by mouth daily as needed for allergies.     . Coenzyme Q10 (CO Q 10 PO) Take 1 tablet by mouth daily.    . CRESTOR 5 MG tablet TAKE 1 TABLET BY MOUTH AT BEDTIME. 90 tablet 3  . escitalopram (LEXAPRO) 10 MG tablet TAKE 1 TABLET BY MOUTH ONCE DAILY 90 tablet 3  . gabapentin (NEURONTIN) 100 MG capsule Take 1 capsule (100 mg total) by mouth at bedtime. 30 capsule 5  . Multiple Vitamins-Minerals (CENTRUM SILVER PO) Take 1 tablet by mouth daily.    Marland Kitchen omeprazole-sodium bicarbonate (ZEGERID) 40-1100 MG per capsule Take 1 capsule by mouth daily before breakfast.    .  triamterene-hydrochlorothiazide (MAXZIDE) 75-50 MG per tablet Take 1 tablet by mouth every morning.     . vitamin E (VITAMIN E) 400 UNIT capsule Take 400 Units by mouth daily.     No current facility-administered medications for this visit.    Family History  Problem Relation Age of Onset  . Heart disease Father   . Heart attack Father   . Hypertension Father   . Heart disease Mother   . Hypertension Mother   . Asthma Brother   . Cancer Maternal Grandmother     lymphoma  . Cancer Maternal Grandfather     prostate cancer    ROS:  Pertinent items are noted in HPI.  Otherwise, a comprehensive ROS was negative.  Exam:   BP 122/76 mmHg  Pulse 60  Ht 5\' 3"  (1.6 m)  Wt 173 lb (78.472 kg)  BMI 30.65 kg/m2  LMP 10/17/2010 (Approximate) Height: 5\' 3"  (160 cm) Ht Readings from Last 3  Encounters:  07/05/15 5\' 3"  (1.6 m)  06/23/15 5\' 3"  (1.6 m)  05/10/15 5\' 3"  (1.6 m)    General appearance: alert, cooperative and appears stated age Head: Normocephalic, without obvious abnormality, atraumatic Neck: no adenopathy, supple, symmetrical, trachea midline and thyroid normal to inspection and palpation Lungs: clear to auscultation bilaterally Breasts: normal appearance, no masses or tenderness Heart: regular rate and rhythm Abdomen: soft, non-tender; no masses,  no organomegaly Extremities: extremities normal, atraumatic, no cyanosis or edema Skin: Skin color, texture, turgor normal. No rashes or lesions Lymph nodes: Cervical, supraclavicular, and axillary nodes normal. No abnormal inguinal nodes palpated Neurologic: Grossly normal   Pelvic: External genitalia:  no lesions              Urethra:  normal appearing urethra with no masses, tenderness or lesions              Bartholin's and Skene's: normal                 Vagina: normal appearing vagina with normal color and discharge, no lesions              Cervix: anteverted              Pap taken: Yes.   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

## 2015-07-06 NOTE — Progress Notes (Signed)
Encounter reviewed by Dr. Brook Amundson C. Silva.  

## 2015-07-07 LAB — IPS PAP TEST WITH HPV

## 2015-07-25 ENCOUNTER — Other Ambulatory Visit: Payer: Self-pay | Admitting: Nurse Practitioner

## 2015-07-25 NOTE — Telephone Encounter (Signed)
Medication refill request: Lexapro Last AEX:  07-05-15  Next AEX: 07-06-16 Last MMG (if hormonal medication request): 11-10-14 WNL Refill authorized: please advise

## 2015-09-09 ENCOUNTER — Ambulatory Visit: Payer: 59 | Admitting: Internal Medicine

## 2015-09-13 ENCOUNTER — Ambulatory Visit (INDEPENDENT_AMBULATORY_CARE_PROVIDER_SITE_OTHER): Payer: 59 | Admitting: Internal Medicine

## 2015-09-13 ENCOUNTER — Encounter: Payer: Self-pay | Admitting: Internal Medicine

## 2015-09-13 VITALS — BP 122/82 | HR 86 | Ht 63.0 in | Wt 174.5 lb

## 2015-09-13 DIAGNOSIS — R05 Cough: Secondary | ICD-10-CM

## 2015-09-13 DIAGNOSIS — R053 Chronic cough: Secondary | ICD-10-CM

## 2015-09-13 DIAGNOSIS — Z23 Encounter for immunization: Secondary | ICD-10-CM | POA: Diagnosis not present

## 2015-09-13 NOTE — Progress Notes (Signed)
Subjective:    Patient ID: Diane Fox, female    DOB: [birth year], 64 y.o.   MRN: [id number]  HPI   OV 04/05/2015 - ACUTE   Chief Complaint  Patient presents with  . Follow-up    Pt last saw RB in 01/2013 for chronic cough. Pt states she did not return beacuse her cough resolved. COugh is now back. Pt c/o dry cough, hoarseness, SOB x since early march. Pt states she has seen her PCP and nothing has helped.      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 She retired early March 2016 and went to [place]. She then developed an abrupt onset of cough that is now persisted for over 6 weeks. She does not recollect any preceding viral illness prior to this. She does not know how exactly the cough started. Since then the cough is persisted and probably worse. It is severe in intensity. It is dry in quality and does not present at night. She is able to sleep well at night. There is no associated wheezing. However that is associated dyspnea on exertion class III levels and relieved by rest. Walking makes cough worse. There is no associated incontinence. She feels the cough is coming from her upper chest. There is associated significant hoarseness of voice and clearing of the throat and a sense of ticklish sensation in the throat. RSI cough score is 22 below and reflects severe cough and irritable larynx syndrome.  Allergy history: She did have allergies in her early life along with asthma but she outgrew those. Last allergy shots was over 20 oh 30 years ago  Sinus history: She admits to some postnasal drainage. She is currently on a bunch of antihistaminics and nasal steroids  Acid reflux: She denies  Pulmonary history: She is been given 2 courses of antibiotics in the last [redacted] weeks along with being on a prednisone taper now for 6 days but this has not helped. One of the antibiotics with Z-Pak. CXR x-ray was in July 2015 and was clear in our  system. FeNO - 9ppb 04/05/2015 for this was done on prednisone which is actually not help the cough. She did have chest x-ray with a primary care physician and apparently some infiltrates was seen but a follow-up chest x-ray on 03/30/2015 this had resolved. I do not have the images with me  Exposure history: She is a nonsmoker   Walking desaturation test 185 feet 3 laps on room air on 04/05/2015: DID NOT DESATURATE      OV 05/10/2015  Chief Complaint  Patient presents with  . Follow-up    Pt stated her cough has much imporved. Pt stated she has an occassonal cough but the deep cough has resolved. Pt denies mucus production. Pt dneies CP/tightness.    Follow-up chronic cough  - Cough is significantly improved over 75%. On RSI cough score it has improved from 22  To  7. She attributes this improvement to gabapentin, anti-histamine chlorpheniramine, acid reflux therapy. She was not able to tolerate high-dose of gabapentin. She was able to tolerate 100 mg once at night. This seems to work well for her. She is quite happy with the level of improvement. She does not want any testing. She continues to battle sinus drainage. She started to make decisions for long-term prevention of chronic cough.  For sinus: She is currently doing chlorpheniramine at night and MediPort For acid reflux: She is doing Zegerid and strict  diet For irritable larynx/cough hypersensitivity: She is doing gabapentin 100 mg at night     OV 06/23/2015  Chief Complaint  Patient presents with  . Follow-up    Pt states she is having more frequent episodes of coughing since last OV. Pt stated she had a productive cough with yellow mucus x 1 day. Pt denies SOB and CP/tightness.    Follow-up chronic cough. This visit was to see her cough is further improved and start winding no therapies. However on the other hand cough is marginally worse. RSI cough score is 9. Yesterday she then had a little phlegm times once no fever or  wheezing or chest pain or chills. She's not interested in anabiotic and prednisone course. At this point in time regardless of the persistence of cough she wants to wean off gabapentin. She's not interested in testing. However if the cough worsens after gabapentin comes off then she would be interested in phase 2 of testing    OV 09/13/2015  Chief Complaint  Patient presents with  . Follow-up    2 month cough follow up - feels cough is worsening x1 month, no mucus production.  denies any wheezing or tightness    Follow-up chronic cough  She is off gabapentin. In the interim her cough has worsened. Cough is rated as moderate to severe. I again elicited the features of the cough. Cough is mostly in the daytime. Only rarely at night. It does not wake her up from sleep typically. Cough is made worse by laughing but not by talking. Drinking water, staying calm for chewing on a lozenge helps the cough. Cough is associated with a tickle. She feels the cough is Coming from the upper airway. At this point in time she is frustrated and definitely wants to workup cough  Dr Lorenza [place] Reflux Symptom Index (> 13-15 suggestive of LPR cough) 04/05/2015  05/10/2015  06/23/2015   Hoarseness of problem with voice 4 0 0  Clearing  Of Throat _0 Excess throat mucus or feeling of post nasal drip _1 Difficulty swallowing food, liquid or tablets 0 0 0  Cough after eating or lying down 0 1 2  Breathing difficulties or choking episodes 5 0 0  Troublesome or annoying cough _2 Sensation of something sticking in throat or lump in throat 0 0 0  Heartburn, chest pain, indigestion, or stomach acid coming up 1 0 1  TOTAL _3 Immunization History  Administered Date(s) Administered  . Influenza Split 10/17/2012, 09/16/2014  . MMR 12/17/2006  . Tdap 11/16/2006  . Varicella 07/06/2011    Current outpatient prescriptions:  .  B Complex-C (B-COMPLEX WITH VITAMIN C) tablet, Take 1 tablet by mouth  daily., Disp: , Rfl:  .  chlorpheniramine (CHLOR-TRIMETON) 4 MG tablet, Take 4 mg by mouth daily as needed for allergies. , Disp: , Rfl:  .  Coenzyme Q10 (CO Q 10 PO), Take 1 tablet by mouth daily., Disp: , Rfl:  .  CRESTOR 5 MG tablet, TAKE 1 TABLET BY MOUTH AT BEDTIME., Disp: 90 tablet, Rfl: 3 .  escitalopram (LEXAPRO) 10 MG tablet, TAKE 1 TABLET BY MOUTH ONCE DAILY, Disp: 90 tablet, Rfl: 3 .  Multiple Vitamins-Minerals (CENTRUM SILVER PO), Take 1 tablet by mouth daily., Disp: , Rfl:  .  triamterene-hydrochlorothiazide (MAXZIDE) 75-50 MG per tablet, Take 1 tablet by mouth every morning. , Disp: , Rfl:  .  vitamin E (  VITAMIN E) 400 UNIT capsule, Take 400 Units by mouth daily., Disp: , Rfl:      Review of Systems Per hpi    Objective:   Physical Exam  Constitutional: She is oriented to person, place, and time. She appears well-developed and well-nourished. No distress.  HENT:  Head: Normocephalic and atraumatic.  Right Ear: External ear normal.  Left Ear: External ear normal.  Mouth/Throat: Oropharynx is clear and moist. No oropharyngeal exudate.  Post nasal drip + Cough + = barking quality  Eyes: Conjunctivae and EOM are normal. Pupils are equal, round, and reactive to light. Right eye exhibits no discharge. Left eye exhibits no discharge. No scleral icterus.  Neck: Normal range of motion. Neck supple. No JVD present. No tracheal deviation present. No thyromegaly present.  Cardiovascular: Normal rate, regular rhythm, normal heart sounds and intact distal pulses.  Exam reveals no gallop and no friction rub.   No murmur heard. Pulmonary/Chest: Effort normal and breath sounds normal. No respiratory distress. She has no wheezes. She has no rales. She exhibits no tenderness.  Abdominal: Soft. Bowel sounds are normal. She exhibits no distension and no mass. There is no tenderness. There is no rebound and no guarding.  Musculoskeletal: Normal range of motion. She exhibits no edema or  tenderness.  Lymphadenopathy:    She has no cervical adenopathy.  Neurological: She is alert and oriented to person, place, and time. She has normal reflexes. No cranial nerve deficit. She exhibits normal muscle tone. Coordination normal.  Skin: Skin is warm and dry. No rash noted. She is not diaphoretic. No erythema. No pallor.  Psychiatric: She has a normal mood and affect. Her behavior is normal. Judgment and thought content normal.  Vitals reviewed.    Filed Vitals:   09/13/15 0927  BP: 122/82  Pulse: 86  Height: _0  (1.6 m)  Weight: 174 lb 8 oz (79.153 kg)  SpO2: 98%        Assessment & Plan:     ICD-9-CM ICD-10-CM   1. Chronic cough 786.2 R05 CT Chest High Resolution     CT Maxillofacial LTD WO CM     too bad your cough is worse  Plan -Do CT sinus without contrast -Do high resolution CT chest without contrast  Follow-up -We'll call you with the above results and if these results are noncontributory will order methacholine challenge test   Dr. Brand Males, M.D., [facility].C.P Pulmonary and Critical Care Medicine Staff Physician [facility] Pager: [phone], If no answer or between  15:00h - 7:00h: call [phone]  09/13/2015 9:50 AM

## 2015-09-13 NOTE — Patient Instructions (Signed)
ICD-9-CM ICD-10-CM   1. Chronic cough 786.2 R05      too bad your cough is worse  Plan -Do CT sinus without contrast -Do high resolution CT chest without contrast  Follow-up -We'll call you with the above results and if these results are noncontributory will order methacholine challenge test

## 2015-09-16 ENCOUNTER — Ambulatory Visit (INDEPENDENT_AMBULATORY_CARE_PROVIDER_SITE_OTHER)
Admission: RE | Admit: 2015-09-16 | Discharge: 2015-09-16 | Disposition: A | Discharge status: Home / Self Care | Payer: 59 | Source: Ambulatory Visit | Attending: Internal Medicine | Admitting: Internal Medicine

## 2015-09-16 DIAGNOSIS — R05 Cough: Secondary | ICD-10-CM

## 2015-09-16 DIAGNOSIS — R053 Chronic cough: Secondary | ICD-10-CM

## 2015-09-19 ENCOUNTER — Telehealth: Payer: Self-pay | Admitting: Internal Medicine

## 2015-09-19 DIAGNOSIS — R053 Chronic cough: Secondary | ICD-10-CM

## 2015-09-19 DIAGNOSIS — R05 Cough: Secondary | ICD-10-CM

## 2015-09-19 NOTE — Telephone Encounter (Signed)
CT chest - no ILD. She has co art calcification but this is due to normal ageing because stress test in 2015 was normal.    CT sinus - mild mucosal thickening only in in sinuses  Overall CT sinus/chest non reveealing for cough   Plan Do methacholine challenge and then return for fu    Ct Chest High Resolution  09/16/2015   CLINICAL DATA:  65 year old female with chronic cough and sinus problems.  EXAM: CT CHEST WITHOUT CONTRAST  TECHNIQUE: Multidetector CT imaging of the chest was performed following the standard protocol without intravenous contrast. High resolution imaging of the lungs, as well as inspiratory and expiratory imaging, was performed.  COMPARISON:  No priors.  FINDINGS: Mediastinum/Lymph Nodes: Heart size is normal. There is no significant pericardial fluid, thickening or pericardial calcification. There is atherosclerosis of the thoracic aorta, the great vessels of the mediastinum and the coronary arteries, including calcified atherosclerotic plaque in the left main and left anterior descending coronary arteries. No pathologically enlarged mediastinal or hilar lymph nodes. Please note that accurate exclusion of hilar adenopathy is limited on noncontrast CT scans. Esophagus is unremarkable in appearance. No axillary lymphadenopathy.  Lungs/Pleura: High-resolution images demonstrate no areas of significant ground-glass attenuation, subpleural reticulation, parenchymal banding, traction bronchiectasis or frank honeycombing to suggest interstitial lung disease at this time. Inspiratory and expiratory imaging demonstrates some mild air trapping, indicative of mild small airways disease. 2 mm nodule in the periphery of the right lower lobe (image 39 of series 3). No larger more suspicious appearing pulmonary nodules or masses are otherwise noted. No acute consolidative airspace disease. No pleural effusions.  Upper abdomen: Atherosclerosis.  Musculoskeletal: There are no aggressive appearing  lytic or blastic lesions noted in the visualized portions of the skeleton.  IMPRESSION: 1. No acute findings in the thorax to account for the patient's symptoms. 2. No findings to suggest interstitial lung disease. 3. Nonspecific 2 mm nodule in the periphery of the right lower lobe. Statistically, this is likely benign. If the patient is at high risk for bronchogenic carcinoma, follow-up chest CT at 1year is recommended. If the patient is at low risk, no follow-up is needed. This recommendation follows the consensus statement: Guidelines for Management of Small Pulmonary Nodules Detected on CT Scans: A Statement from the Fleischner Society as published in Radiology 2005; 237:395-400. 4. Atherosclerosis, including left main and left anterior descending coronary artery disease. Please note that although the presence of coronary artery calcium documents the presence of coronary artery disease, the severity of this disease and any potential stenosis cannot be assessed on this non-gated CT examination. Assessment for potential risk factor modification, dietary therapy or pharmacologic therapy may be warranted, if clinically indicated.   Electronically Signed   By: Vinnie Langton M.D.   On: 09/16/2015 15:30   [place] Cm  09/16/2015   CLINICAL DATA:  Chronic cough  EXAM: CT PARANASAL SINUS LIMITED WITHOUT CONTRAST  TECHNIQUE: Non-contiguous multidetector CT images of the paranasal sinuses were obtained in a single plane without contrast.  COMPARISON:  None.  FINDINGS: Maxillary sinus clear bilaterally. Ostiomeatal unit clear bilaterally. Frontal sinuses clear.  Mild mucosal edema in the sphenoid sinus on the left. Mild mucosal edema in the ethmoid sinuses.  Negative for air-fluid level  Nasal septum deviated to the right.  Regional soft tissues are normal.  IMPRESSION: Mild mucosal thickening in the sphenoid and ethmoid sinuses. No air-fluid level.   Electronically Signed   By: Franchot Gallo M.D.  On: 09/16/2015 15:03

## 2015-09-20 NOTE — Telephone Encounter (Signed)
lmtcb for pt.  

## 2015-09-20 NOTE — Telephone Encounter (Signed)
lmtcb 10/4

## 2015-09-26 NOTE — Telephone Encounter (Signed)
MR please advise if pt needs to see TP after MCT as your first available appt slot is mid November.

## 2015-09-26 NOTE — Telephone Encounter (Signed)
Patient scheduled 10/04/15 @1pm  for Methacholine challenge [facility]; pt is aware of the appt.

## 2015-09-26 NOTE — Telephone Encounter (Signed)
upto the patient.

## 2015-09-26 NOTE — Telephone Encounter (Signed)
Called and spoke to pt. Informed her of the results and recs per MR. Order placed. Pt verbalized understanding.   PCC's please advise when appt is scheduled so f/u visit can be scheduled. Thanks.

## 2015-09-26 NOTE — Telephone Encounter (Signed)
Called and spoke to pt. Offered appt with TP or MR. Appt made with TP on 10.21.2016. Pt verbalized understanding and denied any further questions or concerns at this time.

## 2015-09-28 ENCOUNTER — Encounter: Payer: Self-pay | Admitting: Cardiology

## 2015-10-03 ENCOUNTER — Encounter (HOSPITAL_COMMUNITY): Payer: 59

## 2015-10-04 ENCOUNTER — Ambulatory Visit (HOSPITAL_COMMUNITY)
Admission: RE | Admit: 2015-10-04 | Discharge: 2015-10-04 | Disposition: A | Discharge status: Home / Self Care | Payer: 59 | Source: Ambulatory Visit | Attending: Internal Medicine | Admitting: Internal Medicine

## 2015-10-04 DIAGNOSIS — R053 Chronic cough: Secondary | ICD-10-CM

## 2015-10-04 DIAGNOSIS — R0609 Other forms of dyspnea: Secondary | ICD-10-CM | POA: Insufficient documentation

## 2015-10-04 DIAGNOSIS — R05 Cough: Secondary | ICD-10-CM | POA: Diagnosis present

## 2015-10-04 LAB — PULMONARY FUNCTION TEST
FEF 25-75 PRE: 2.4 L/s
FEF 25-75 Post: 2 L/sec
FEF2575-%CHANGE-POST: -16 %
FEF2575-%PRED-POST: 93 %
FEF2575-%Pred-Pre: 112 %
FEV1-%CHANGE-POST: -2 %
FEV1-%PRED-POST: 95 %
FEV1-%Pred-Pre: 97 %
FEV1-PRE: 2.36 L
FEV1-Post: 2.31 L
FEV1FVC-%CHANGE-POST: 4 %
FEV1FVC-%PRED-PRE: 102 %
FEV6-%Change-Post: -5 %
FEV6-%PRED-POST: 91 %
FEV6-%Pred-Pre: 96 %
FEV6-PRE: 2.94 L
FEV6-Post: 2.78 L
FEV6FVC-%PRED-PRE: 104 %
FEV6FVC-%Pred-Post: 104 %
FVC-%CHANGE-POST: -6 %
FVC-%Pred-Post: 88 %
FVC-%Pred-Pre: 94 %
FVC-POST: 2.78 L
FVC-PRE: 2.98 L
POST FEV1/FVC RATIO: 83 %
PRE FEV6/FVC RATIO: 100 %
Post FEV6/FVC ratio: 100 %
Pre FEV1/FVC ratio: 79 %

## 2015-10-04 MED ORDER — METHACHOLINE 4 MG/ML NEB SOLN: 2.0000 mL | Freq: Once | RESPIRATORY_TRACT | Status: DC

## 2015-10-04 MED ORDER — METHACHOLINE 4 MG/ML NEB SOLN
2.0000 mL | Freq: Once | RESPIRATORY_TRACT | Status: AC
  Administered 2015-10-04: 8 mg via RESPIRATORY_TRACT

## 2015-10-04 MED ORDER — SODIUM CHLORIDE 0.9 % IN NEBU
3.0000 mL | INHALATION_SOLUTION | Freq: Once | RESPIRATORY_TRACT | Status: AC
  Administered 2015-10-04: 3 mL via RESPIRATORY_TRACT

## 2015-10-04 MED ORDER — METHACHOLINE 0.0625 MG/ML NEB SOLN: 2.0000 mL | Freq: Once | RESPIRATORY_TRACT | Status: DC

## 2015-10-04 MED ORDER — METHACHOLINE 0.25 MG/ML NEB SOLN: 2.0000 mL | Freq: Once | RESPIRATORY_TRACT | Status: DC

## 2015-10-04 MED ORDER — SODIUM CHLORIDE 0.9 % IN NEBU: 3.0000 mL | INHALATION_SOLUTION | Freq: Once | RESPIRATORY_TRACT | Status: DC

## 2015-10-04 MED ORDER — ALBUTEROL SULFATE (2.5 MG/3ML) 0.083% IN NEBU: 2.5000 mg | INHALATION_SOLUTION | Freq: Once | RESPIRATORY_TRACT | Status: DC

## 2015-10-04 MED ORDER — METHACHOLINE 0.0625 MG/ML NEB SOLN
2.0000 mL | Freq: Once | RESPIRATORY_TRACT | Status: AC
  Administered 2015-10-04: 0.125 mg via RESPIRATORY_TRACT

## 2015-10-04 MED ORDER — METHACHOLINE 16 MG/ML NEB SOLN
2.0000 mL | Freq: Once | RESPIRATORY_TRACT | Status: AC
  Administered 2015-10-04: 32 mg via RESPIRATORY_TRACT

## 2015-10-04 MED ORDER — METHACHOLINE 1 MG/ML NEB SOLN
2.0000 mL | Freq: Once | RESPIRATORY_TRACT | Status: AC
  Administered 2015-10-04: 2 mg via RESPIRATORY_TRACT

## 2015-10-04 MED ORDER — METHACHOLINE 16 MG/ML NEB SOLN: 2.0000 mL | Freq: Once | RESPIRATORY_TRACT | Status: DC

## 2015-10-04 MED ORDER — METHACHOLINE 0.25 MG/ML NEB SOLN
2.0000 mL | Freq: Once | RESPIRATORY_TRACT | Status: AC
  Administered 2015-10-04: 0.5 mg via RESPIRATORY_TRACT

## 2015-10-04 MED ORDER — ALBUTEROL SULFATE (2.5 MG/3ML) 0.083% IN NEBU
2.5000 mg | INHALATION_SOLUTION | Freq: Once | RESPIRATORY_TRACT | Status: AC
  Administered 2015-10-04: 2.5 mg via RESPIRATORY_TRACT

## 2015-10-04 MED ORDER — METHACHOLINE 1 MG/ML NEB SOLN: 2.0000 mL | Freq: Once | RESPIRATORY_TRACT | Status: DC

## 2015-10-07 ENCOUNTER — Ambulatory Visit (INDEPENDENT_AMBULATORY_CARE_PROVIDER_SITE_OTHER): Payer: 59 | Admitting: Adult Health

## 2015-10-07 ENCOUNTER — Encounter: Payer: Self-pay | Admitting: Adult Health

## 2015-10-07 VITALS — BP 108/80 | HR 82 | Temp 98.0°F | Ht 63.0 in | Wt 173.8 lb

## 2015-10-07 DIAGNOSIS — R05 Cough: Secondary | ICD-10-CM

## 2015-10-07 DIAGNOSIS — R053 Chronic cough: Secondary | ICD-10-CM

## 2015-10-07 MED ORDER — BENZONATATE 200 MG PO CAPS: 200.0000 mg | ORAL_CAPSULE | Freq: Three times a day (TID) | ORAL | Status: DC | PRN

## 2015-10-07 NOTE — Patient Instructions (Signed)
Delsym 2 tsp Twice daily  For cough  Tessalon Three times a day  As needed  Cough  Allegra 180mg  in am .  Chlortrimeton 4mg  2 At bedtime  As needed  Drainage  Begin Prilosec 20mg  daily in am .  Begin Pepcid 20mg  At bedtime   Use sips of water to soothe throat . Try not to cough or throat clear  NO MINTS . Follow up .Dr. Chase Caller in 6 weeks and As needed   Please contact office for sooner follow up if symptoms do not improve or worsen or seek emergency care

## 2015-10-07 NOTE — Assessment & Plan Note (Signed)
Chronic cough Workup thus far has been unrevealing There was no airflow obstruction or restriction on pulmonary function test. Methacholine challenge test was negative. CT chest high resolution was negative for interstitial lung disease or acute process CT sinus was also negative for acute disease. We'll place patient on an aggressive cough suppression regimen with Delsym and Tessalon Perles for cough control. Patient is to begin Prilosec and Pepcid for reflux, prevention along with Allegra and Chlor-Trimeton 4 postnasal drip symptoms.   Plan  Delsym 2 tsp Twice daily  For cough  Tessalon Three times a day  As needed  Cough  Allegra 180mg  in am .  Chlortrimeton 4mg  2 At bedtime  As needed  Drainage  Begin Prilosec 20mg  daily in am .  Begin Pepcid 20mg  At bedtime   Use sips of water to soothe throat . Try not to cough or throat clear  NO MINTS . Follow up .Dr. Chase Caller in 6 weeks and As needed   Please contact office for sooner follow up if symptoms do not improve or worsen or seek emergency care

## 2015-10-07 NOTE — Progress Notes (Signed)
Subjective:    Patient ID: Diane Fox, female    DOB: 1951/02/23, 64 y.o.   MRN: [id number]  HPI 64 year old female followed for chronic cough by Dr. Chase Caller   10/07/2015 Follow up : Chronic cough Patient returns for 1 month follow-up Patient has had a chronic cough since March 2016. She did have some improvement in her cough with a aggressive regimen of cough suppression antihistamines and reflux, prevention. She was also treated with gabapentin.. Patient did have improvement but did not have complete resolution. Cough flared up about 6 weeks ago. She is currently not using anything for cough. She's been seen by her primary care physician and given 2 antibiotics without much relief. Patient was set up for a CT sinus and a high resolution CT chest. Both of these were essentially unrevealing. See results below. Patient was also sent for a methacholine challenge test that showed normal lung function without airflow obstruction or restriction. Methacholine challenge was negative. We discussed these results in detail. Patient is very frustrated as her cough is mostly throughout the day and is essentially a dry cough. She does have frequent throat clearing. She does use admit cough drops which we discussed not using going forward. She does not have any cough at nighttime. She denies any fever, hemoptysis, orthopnea, PND, leg swelling or discolored mucus.    Review of Systems Constitutional:   No  weight loss, night sweats,  Fevers, chills, fatigue, or  lassitude.  HEENT:   No headaches,  Difficulty swallowing,  Tooth/dental problems, or  Sore throat,                No sneezing, itching, ear ache,  +nasal congestion, post nasal drip,   CV:  No chest pain,  Orthopnea, PND, swelling in lower extremities, anasarca, dizziness, palpitations, syncope.   GI  No heartburn, indigestion, abdominal pain, nausea, vomiting, diarrhea, change in bowel habits, loss of appetite, bloody stools.    Resp:   No chest wall deformity  Skin: no rash or lesions.  GU: no dysuria, change in color of urine, no urgency or frequency.  No flank pain, no hematuria   MS:  No joint pain or swelling.  No decreased range of motion.  No back pain.  Psych:  No change in mood or affect. No depression or anxiety.  No memory loss.         Objective:   Physical Exam  Vital signs reviewed  GEN: A/Ox3; pleasant , NAD, well nourished , barking cough   HEENT:  [place]/AT,  EACs-clear, TMs-wnl, NOSE-clear, THROAT-clear, no lesions, no postnasal drip or exudate noted.   NECK:  Supple w/ fair ROM; no JVD; normal carotid impulses w/o bruits; no thyromegaly or nodules palpated; no lymphadenopathy.  RESP  Clear  P & A; w/o, wheezes/ rales/ or rhonchi.no accessory muscle use, no dullness to percussion  CARD:  RRR, no m/r/g  , no peripheral edema, pulses intact, no cyanosis or clubbing.  GI:   Soft & nt; nml bowel sounds; no organomegaly or masses detected.  Musco: Warm bil, no deformities or joint swelling noted.   Neuro: alert, no focal deficits noted.    Skin: Warm, no lesions or rashes  CT sinus 09/16/2015 showed no acute sinus disease  CT chest high resolution on September 30 showed no evidence of interstitial lung disease . There was a small 2 mm nodule in the right lower lobe that was considered most likely benign considering patient is a never smoker.  Assessment & Plan:

## 2015-10-26 ENCOUNTER — Other Ambulatory Visit: Payer: Self-pay

## 2015-10-26 DIAGNOSIS — Z1231 Encounter for screening mammogram for malignant neoplasm of breast: Secondary | ICD-10-CM

## 2015-11-22 ENCOUNTER — Ambulatory Visit (INDEPENDENT_AMBULATORY_CARE_PROVIDER_SITE_OTHER): Payer: Medicare Other | Admitting: Adult Health

## 2015-11-22 ENCOUNTER — Encounter: Payer: Self-pay | Admitting: Adult Health

## 2015-11-22 VITALS — BP 130/84 | HR 66 | Temp 97.6°F | Ht 63.0 in | Wt 176.0 lb

## 2015-11-22 DIAGNOSIS — R05 Cough: Secondary | ICD-10-CM | POA: Diagnosis not present

## 2015-11-22 DIAGNOSIS — R053 Chronic cough: Secondary | ICD-10-CM

## 2015-11-22 NOTE — Assessment & Plan Note (Signed)
Recent flare now resolved with GERD/AR and cough prevention regimen   Plan  Delsym 2 tsp Twice daily  For cough  Tessalon Three times a day  As needed  Cough  Allegra 180mg  in am .  Chlortrimeton 4mg  2 At bedtime  As needed  Drainage   Prilosec 20mg  daily in am .   Pepcid 20mg  At bedtime   Use sips of water to soothe throat . Try not to cough or throat clear  NO MINTS .

## 2015-11-22 NOTE — Patient Instructions (Signed)
So glad you are better.  Below is your cough regimen if the cough returns  Delsym 2 tsp Twice daily  For cough  Tessalon Three times a day  As needed  Cough  Allegra 180mg  in am .  Chlortrimeton 4mg  2 At bedtime  As needed  Drainage   Prilosec 20mg  daily in am .   Pepcid 20mg  At bedtime   Use sips of water to soothe throat . Try not to cough or throat clear  NO MINTS . Follow up .Dr. Chase Caller in 4-6 months and As needed   Please contact office for sooner follow up if symptoms do not improve or worsen or seek emergency care

## 2015-11-22 NOTE — Progress Notes (Signed)
Subjective:    Patient ID: Diane Fox, female    DOB: 19-Sep-1951, 64 y.o.   MRN: [id number]  HPI  65 year old female followed for chronic cough by Dr. Chase Caller   11/22/2015 Follow up : Chronic cough Patient returns for 6 week follow up  Patient has had a chronic cough since March 2016. She has underwent an extensive workup .  Patient was set up for a CT sinus and a high resolution CT chest. Both of these were essentially unrevealing. See results below. Patient was also sent for a methacholine challenge test that showed normal lung function without airflow obstruction or restriction. Methacholine challenge was negative. Last visit she was having cough flare . She was placed on aggressive cough prevention regimen as follows:  Delsym 2 tsp Twice daily  For cough  Tessalon Three times a day  As needed  Cough  Allegra 180mg  in am .  Chlortrimeton 4mg  2 At bedtime  As needed  Drainage   Prilosec 20mg  daily in am .   Pepcid 20mg  At bedtime   Use sips of water to soothe throat . Try not to cough or throat clear  NO MINTS .  She says she cough has totally resolved. She has stopped the delsym and tessalon.  Remains on allegra and chlortrimeton .  Feels this has really helped.       Review of Systems  Constitutional:   No  weight loss, night sweats,  Fevers, chills, fatigue, or  lassitude.  HEENT:   No headaches,  Difficulty swallowing,  Tooth/dental problems, or  Sore throat,                No sneezing, itching, ear ache, nasal congestion, post nasal drip,   CV:  No chest pain,  Orthopnea, PND, swelling in lower extremities, anasarca, dizziness, palpitations, syncope.   GI  No heartburn, indigestion, abdominal pain, nausea, vomiting, diarrhea, change in bowel habits, loss of appetite, bloody stools.   Resp:   No chest wall deformity  Skin: no rash or lesions.  GU: no dysuria, change in color of urine, no urgency or frequency.  No flank pain, no hematuria   MS:  No joint  pain or swelling.  No decreased range of motion.  No back pain.  Psych:  No change in mood or affect. No depression or anxiety.  No memory loss.         Objective:   Physical Exam   Vital signs reviewed  GEN: A/Ox3; pleasant , NAD, well nourished   HEENT:  [place]/AT,  EACs-clear, TMs-wnl, NOSE-clear, THROAT-clear, no lesions, no postnasal drip or exudate noted.   NECK:  Supple w/ fair ROM; no JVD; normal carotid impulses w/o bruits; no thyromegaly or nodules palpated; no lymphadenopathy.  RESP  Clear  P & A; w/o, wheezes/ rales/ or rhonchi.no accessory muscle use, no dullness to percussion  CARD:  RRR, no m/r/g  , no peripheral edema, pulses intact, no cyanosis or clubbing.  GI:   Soft & nt; nml bowel sounds; no organomegaly or masses detected.  Musco: Warm bil, no deformities or joint swelling noted.   Neuro: alert, no focal deficits noted.    Skin: Warm, no lesions or rashes  CT sinus 09/16/2015 showed no acute sinus disease  CT chest high resolution on September 30 showed no evidence of interstitial lung disease . There was a small 2 mm nodule in the right lower lobe that was considered most likely benign considering patient is a  never smoker.      Assessment & Plan:

## 2015-12-20 MED FILL — TRIAMTERENE/HCTZ 75/50 TAB: 75-50 | 90 days supply | Qty: 90 | Fill #1

## 2016-01-23 DIAGNOSIS — R05 Cough: Secondary | ICD-10-CM | POA: Diagnosis not present

## 2016-01-23 MED FILL — AZITHROMYCIN 250 MG TABLET: 250 | 5 days supply | Qty: 6 | Fill #0

## 2016-01-23 MED FILL — VENTOLIN HFA 90 MCG INHALER: 108 (90 BAS | 17 days supply | Qty: 18 | Fill #0

## 2016-01-23 MED FILL — PROMETHAZINE-CODEINE SYRUP: 6.25-10 | 4 days supply | Qty: 120 | Fill #0

## 2016-01-25 ENCOUNTER — Ambulatory Visit
Admission: RE | Admit: 2016-01-25 | Discharge: 2016-01-25 | Disposition: A | Discharge status: Home / Self Care | Payer: Medicare Other | Source: Ambulatory Visit | Attending: Nurse Practitioner | Admitting: Nurse Practitioner

## 2016-01-25 ENCOUNTER — Ambulatory Visit
Admission: RE | Admit: 2016-01-25 | Discharge: 2016-01-25 | Disposition: A | Discharge status: Home / Self Care | Payer: Medicare Other | Source: Ambulatory Visit

## 2016-01-25 DIAGNOSIS — Z780 Asymptomatic menopausal state: Secondary | ICD-10-CM | POA: Diagnosis not present

## 2016-01-25 DIAGNOSIS — E2839 Other primary ovarian failure: Secondary | ICD-10-CM

## 2016-01-25 DIAGNOSIS — Z1231 Encounter for screening mammogram for malignant neoplasm of breast: Secondary | ICD-10-CM | POA: Diagnosis not present

## 2016-02-06 MED FILL — ESCITALOPRAM 10 MG TABLET: 10 | 90 days supply | Qty: 90 | Fill #2

## 2016-02-16 MED FILL — ROSUVASTATIN CALCIUM 5 MG T: 5 | 90 days supply | Qty: 90 | Fill #3

## 2016-03-15 DIAGNOSIS — Z23 Encounter for immunization: Secondary | ICD-10-CM | POA: Diagnosis not present

## 2016-03-15 DIAGNOSIS — R609 Edema, unspecified: Secondary | ICD-10-CM | POA: Diagnosis not present

## 2016-03-15 DIAGNOSIS — M8580 Other specified disorders of bone density and structure, unspecified site: Secondary | ICD-10-CM | POA: Diagnosis not present

## 2016-03-15 DIAGNOSIS — Z79899 Other long term (current) drug therapy: Secondary | ICD-10-CM | POA: Diagnosis not present

## 2016-03-15 DIAGNOSIS — M899 Disorder of bone, unspecified: Secondary | ICD-10-CM | POA: Diagnosis not present

## 2016-03-15 DIAGNOSIS — J309 Allergic rhinitis, unspecified: Secondary | ICD-10-CM | POA: Diagnosis not present

## 2016-03-15 DIAGNOSIS — E782 Mixed hyperlipidemia: Secondary | ICD-10-CM | POA: Diagnosis not present

## 2016-03-15 DIAGNOSIS — N289 Disorder of kidney and ureter, unspecified: Secondary | ICD-10-CM | POA: Diagnosis not present

## 2016-03-15 MED FILL — TRIAMTERENE/HCTZ 75/50 TAB: 75-50 | 90 days supply | Qty: 90 | Fill #0

## 2016-04-20 ENCOUNTER — Ambulatory Visit (INDEPENDENT_AMBULATORY_CARE_PROVIDER_SITE_OTHER): Payer: Medicare Other | Admitting: Cardiology

## 2016-04-20 ENCOUNTER — Encounter: Payer: Self-pay | Admitting: Cardiology

## 2016-04-20 VITALS — BP 120/86 | HR 80 | Ht 63.0 in | Wt 180.8 lb

## 2016-04-20 DIAGNOSIS — E7800 Pure hypercholesterolemia, unspecified: Secondary | ICD-10-CM | POA: Diagnosis not present

## 2016-04-20 NOTE — Patient Instructions (Signed)
Continue your current therapy  I will see you in one year   

## 2016-04-20 NOTE — Progress Notes (Signed)
Diane Fox Date of Birth: 10/22/1951 Medical Record [id number]  History of Present Illness: Seen today for followup hypercholesterolemia. She has a history of hypercholesterolemia and is on Crestor therapy.  She had a normal stress Echo in April 2015. 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 She does have a chronic cough and has been extensively worked up by pulmonary. Overall she feels well. She is exercising regularly. She has gained 4 lbs. No chest pain or SOB.   Current Outpatient Prescriptions on File Prior to Visit  Medication Sig Dispense Refill  . B Complex-C (B-COMPLEX WITH VITAMIN C) tablet Take 1 tablet by mouth daily.    . chlorpheniramine (CHLOR-TRIMETON) 4 MG tablet Take 4 mg by mouth daily as needed for allergies.     . cholecalciferol (VITAMIN D) 1000 UNITS tablet Take 1,000 Units by mouth daily.    . Coenzyme Q10 (CO Q 10 PO) Take 1 tablet by mouth daily.    . CRESTOR 5 MG tablet TAKE 1 TABLET BY MOUTH AT BEDTIME. 90 tablet 3  . escitalopram (LEXAPRO) 10 MG tablet TAKE 1 TABLET BY MOUTH ONCE DAILY 90 tablet 3  . famotidine (PEPCID) 20 MG tablet Take 20 mg by mouth 2 (two) times daily.    . fexofenadine (ALLEGRA) 180 MG tablet Take 180 mg by mouth daily.    . Multiple Vitamins-Minerals (CENTRUM SILVER PO) Take 1 tablet by mouth daily.    Marland Kitchen omeprazole (PRILOSEC) 20 MG capsule Take 20 mg by mouth daily.    Marland Kitchen triamcinolone (NASACORT ALLERGY 24HR) 55 MCG/ACT AERO nasal inhaler Place 2 sprays into the nose daily.    Marland Kitchen triamterene-hydrochlorothiazide (MAXZIDE) 75-50 MG per tablet Take 1 tablet by mouth every morning.     . vitamin E (VITAMIN E) 400 UNIT capsule Take 400 Units by mouth daily.     No current facility-administered medications on file prior to visit.    Allergies  Allergen Reactions  . Biaxin [Clarithromycin] Hives  . Penicillins Hives  . Sulfa Antibiotics Hives  . Zostavax [Zoster Vaccine Live] Hives    Past Medical History  Diagnosis Date  .  Hypercholesterolemia   . Atypical chest pain march 2015    april stress test=normal  . Arthritis   . Peri-menopausal     lexapro prn  . Edema     maxide prn  . 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 H/O measles   . H/O mumps   . Asthma     allergy related, no attacks  . Environmental allergies     Past Surgical History  Procedure Laterality Date  . Right oophorectomy Right 11/91    tubal ligation with complications  . Breast bx  09/2002    breast sterootactec biopsy  . Hysteroscopy  2007    polyp removed-benign  . Polypectomy  07/2007    hysteroscopic with removal of polyp Dr. Sabra Heck  . Endometrial biopsy  08/2010    proliferative endo  . Tubal ligation  1991  . Patella-femoral arthroplasty Left 11/23/2013    Procedure: LEFT KNEE PATELLA-FEMORAL ARTHROPLASTY;  Surgeon: Gearlean Alf, MD;  Location: [facility];  Service: Orthopedics;  Laterality: Left;  . Dilation and curettage of uterus      x2  . Breast surgery Right 10/03    focal atypia hyperplasia  . Wisdom tooth extraction  early 20's  . Colonoscopy    . Total knee arthroplasty Left 07/05/2014    Procedure:  LEFT TOTAL KNEE ARTHROPLASTY;  Surgeon: Gearlean Alf, MD;  Location: [facility];  Service: Orthopedics;  Laterality: Left;    History  Smoking status  . Never Smoker   Smokeless tobacco  . Never Used    History  Alcohol Use  . 0.6 oz/week  . 1 Glasses of wine per week    Comment: social    Family History  Problem Relation Age of Onset  . Heart disease Father   . Heart attack Father   . Hypertension Father   . Heart disease Mother   . Hypertension Mother   . Asthma Brother   . Cancer Maternal Grandmother     lymphoma  . Cancer Maternal Grandfather     prostate cancer    Review of Systems: As noted in history of present illness. Positive for cough.  All other systems were reviewed and are negative.  Physical Exam: BP 120/86 mmHg  Pulse 80  Ht 5\' 3"  (1.6 m)  Wt 82.01 kg  (180 lb 12.8 oz)  BMI 32.04 kg/m2  LMP 10/17/2010 (Approximate) The patient is alert and oriented x 3. The HEENT exam is normal. The carotids are 2+ without bruits.  There is no thyromegaly.  There is no JVD.  The lungs are clear.  The heart exam reveals a regular rate with a normal S1 and S2.  There are no murmurs, gallops, or rubs.  The PMI is not displaced.     Exam of the legs reveal no edema.   The distal pulses are intact.  Cranial nerves II - XII are intact.  Motor and sensory functions are intact.  The gait is normal.  LABORATORY DATA: ECG today demonstrates normal sinus rhythm with a nonspecific TWA. Rate 64 bpm.I have personally reviewed and interpreted this study.  Lab Results  Component Value Date   WBC 13.2* 07/07/2014   HGB 12.7 07/07/2014   HCT 37.4 07/07/2014   PLT 322 07/07/2014   GLUCOSE 125* 07/07/2014   CHOL 174 03/02/2014   TRIG 144.0 03/02/2014   HDL 60.90 03/02/2014   LDLCALC 84 03/02/2014   ALT 35 03/02/2014   AST 25 03/02/2014   NA 141 07/07/2014   K 3.9 07/07/2014   CL 101 07/07/2014   CREATININE 0.89 07/07/2014   BUN 18 07/07/2014   CO2 29 07/07/2014   INR 0.94 06/24/2014   Labs dated 03/15/16 reviewed: Normal CBC, Chemistry panel and TSH. Cholesterol- 91, trig- 132, HDL- 68, LDL 97.   Assessment / Plan: 1. Hypercholesterolemia. At goal on crestor. Continue Rx. Encourage better diet and weight loss. Continue exercise.   I will follow up in one year.

## 2016-05-15 DIAGNOSIS — H52222 Regular astigmatism, left eye: Secondary | ICD-10-CM | POA: Diagnosis not present

## 2016-05-15 DIAGNOSIS — H5211 Myopia, right eye: Secondary | ICD-10-CM | POA: Diagnosis not present

## 2016-05-15 DIAGNOSIS — H524 Presbyopia: Secondary | ICD-10-CM | POA: Diagnosis not present

## 2016-05-15 DIAGNOSIS — H2513 Age-related nuclear cataract, bilateral: Secondary | ICD-10-CM | POA: Diagnosis not present

## 2016-05-15 DIAGNOSIS — H04123 Dry eye syndrome of bilateral lacrimal glands: Secondary | ICD-10-CM | POA: Diagnosis not present

## 2016-05-15 DIAGNOSIS — H43811 Vitreous degeneration, right eye: Secondary | ICD-10-CM | POA: Diagnosis not present

## 2016-05-15 DIAGNOSIS — H52221 Regular astigmatism, right eye: Secondary | ICD-10-CM | POA: Diagnosis not present

## 2016-05-15 DIAGNOSIS — H11153 Pinguecula, bilateral: Secondary | ICD-10-CM | POA: Diagnosis not present

## 2016-05-21 ENCOUNTER — Other Ambulatory Visit: Payer: Self-pay | Admitting: Cardiology

## 2016-05-21 MED FILL — ROSUVASTATIN CALCIUM 5 MG T: 5 | 90 days supply | Qty: 90 | Fill #0

## 2016-06-25 MED FILL — TRIAMTERENE/HCTZ 75/50 TAB: 75-50 | 90 days supply | Qty: 90 | Fill #1

## 2016-07-03 ENCOUNTER — Ambulatory Visit: Payer: 59 | Admitting: Nurse Practitioner

## 2016-07-04 ENCOUNTER — Ambulatory Visit (INDEPENDENT_AMBULATORY_CARE_PROVIDER_SITE_OTHER): Payer: Medicare Other | Admitting: Nurse Practitioner

## 2016-07-04 ENCOUNTER — Encounter: Payer: Self-pay | Admitting: *Deleted

## 2016-07-04 ENCOUNTER — Encounter: Payer: Self-pay | Admitting: Nurse Practitioner

## 2016-07-04 VITALS — BP 118/76 | HR 64 | Ht 62.5 in | Wt 183.0 lb

## 2016-07-04 DIAGNOSIS — Z0000 Encounter for general adult medical examination without abnormal findings: Secondary | ICD-10-CM | POA: Diagnosis not present

## 2016-07-04 DIAGNOSIS — Z1211 Encounter for screening for malignant neoplasm of colon: Secondary | ICD-10-CM

## 2016-07-04 DIAGNOSIS — Z124 Encounter for screening for malignant neoplasm of cervix: Secondary | ICD-10-CM | POA: Diagnosis not present

## 2016-07-04 DIAGNOSIS — Z01419 Encounter for gynecological examination (general) (routine) without abnormal findings: Secondary | ICD-10-CM | POA: Diagnosis not present

## 2016-07-04 NOTE — Patient Instructions (Signed)

## 2016-07-04 NOTE — Progress Notes (Signed)
Patient ID: Diane Fox, female   DOB: 02-24-1951, 65 y.o.   MRN: [id number]  65 y.o. [id number] Married  Caucasian Fe here for annual exam.  Potentially a new diagnosis of CKD.  Will be having more labs done next week after stopping all NSAID's. 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  Patient's last menstrual period was 10/17/2010 (approximate).          Sexually active: No.  The current method of family planning is post menopausal status.    Exercising: Yes.    walking and yoga Smoker:  no  Health Maintenance: Pap: 07/05/15, Negative with neg HR HPV MMG: 01/25/16, Bi-Rads 1:  Negative  Colonoscopy:  2012, normal, repeat in 10 years, Dr. Cristina Gong  BMD: 01/25/16, T Score: -0.5 Spine L1-L2 only / -0.8 Left Femur Neck TDaP: 11/16/06 Shingles: 07/06/11 Hep C and HIV:  discuss today and will get next week at PCP Labs: 03/15/16 in EPIC   reports that she has never smoked. She has never used smokeless tobacco. She reports that she drinks about 0.6 oz of alcohol per week. She reports that she does not use illicit drugs.  Past Medical History  Diagnosis Date  . Hypercholesterolemia   . Atypical chest pain march 2015    april stress test=normal  . Arthritis   . Peri-menopausal     lexapro prn  . Edema     maxide prn  . 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 H/O measles   . H/O mumps   . Asthma     allergy related, no attacks  . Environmental allergies     Past Surgical History  Procedure Laterality Date  . Right oophorectomy Right 11/91    tubal ligation with complications  . Breast bx  09/2002    breast sterootactec biopsy  . Hysteroscopy  2007    polyp removed-benign  . Polypectomy  07/2007    hysteroscopic with removal of polyp Dr. Sabra Heck  . Endometrial biopsy  08/2010    proliferative endo  . Tubal ligation  1991  . Patella-femoral arthroplasty Left 11/23/2013    Procedure: LEFT KNEE  PATELLA-FEMORAL ARTHROPLASTY;  Surgeon: Gearlean Alf, MD;  Location: [facility];  Service: Orthopedics;  Laterality: Left;  . Dilation and curettage of uterus      x2  . Breast surgery Right 10/03    focal atypia hyperplasia  . Wisdom tooth extraction  early 20's  . Colonoscopy    . Total knee arthroplasty Left 07/05/2014    Procedure: LEFT TOTAL KNEE ARTHROPLASTY;  Surgeon: Gearlean Alf, MD;  Location: [facility];  Service: Orthopedics;  Laterality: Left;    Current Outpatient Prescriptions  Medication Sig Dispense Refill  . B Complex-C (B-COMPLEX WITH VITAMIN C) tablet Take 1 tablet by mouth daily.    . chlorpheniramine (CHLOR-TRIMETON) 4 MG tablet Take 4 mg by mouth daily as needed for allergies.     . cholecalciferol (VITAMIN D) 1000 UNITS tablet Take 1,000 Units by mouth daily.    . Coenzyme Q10 (CO Q 10 PO) Take 1 tablet by mouth daily.    . famotidine (PEPCID) 20 MG tablet Take 20 mg by mouth 2 (two) times daily.    . fexofenadine (ALLEGRA) 180 MG tablet Take 180 mg by mouth daily.    . Multiple Vitamins-Minerals (CENTRUM SILVER PO) Take 1 tablet by mouth daily.    Marland Kitchen omeprazole (  PRILOSEC) 20 MG capsule Take 20 mg by mouth daily.    . rosuvastatin (CRESTOR) 5 MG tablet TAKE 1 TABLET BY MOUTH AT BEDTIME. 90 tablet 0  . triamcinolone (NASACORT ALLERGY 24HR) 55 MCG/ACT AERO nasal inhaler Place 2 sprays into the nose daily.    Marland Kitchen triamterene-hydrochlorothiazide (MAXZIDE) 75-50 MG per tablet Take 1 tablet by mouth every morning.     . vitamin E (VITAMIN E) 400 UNIT capsule Take 400 Units by mouth daily.     No current facility-administered medications for this visit.    Family History  Problem Relation Age of Onset  . Heart disease Father   . Heart attack Father   . Hypertension Father   . Heart disease Mother   . Hypertension Mother   . Asthma Brother   . Cancer Maternal Grandmother     lymphoma  . Cancer Maternal Grandfather     prostate cancer    ROS:  Pertinent items are  noted in HPI.  Otherwise, a comprehensive ROS was negative.  Exam:   BP 118/76 mmHg  Pulse 64  Ht 5' 2.5" (1.588 m)  Wt 183 lb (83.008 kg)  BMI 32.92 kg/m2  LMP 10/17/2010 (Approximate) Height: 5' 2.5" (158.8 cm) Ht Readings from Last 3 Encounters:  07/04/16 5' 2.5" (1.588 m)  04/20/16 5\' 3"  (1.6 m)  11/22/15 5\' 3"  (1.6 m)    General appearance: alert, cooperative and appears stated age Head: Normocephalic, without obvious abnormality, atraumatic Neck: no adenopathy, supple, symmetrical, trachea midline and thyroid normal to inspection and palpation Lungs: clear to auscultation bilaterally Breasts: normal appearance, no masses or tenderness Heart: regular rate and rhythm Abdomen: soft, non-tender; no masses,  no organomegaly Extremities: extremities normal, atraumatic, no cyanosis or edema Skin: Skin color, texture, turgor normal. No rashes or lesions Lymph nodes: Cervical, supraclavicular, and axillary nodes normal. No abnormal inguinal nodes palpated Neurologic: Grossly normal   Pelvic: External genitalia:  no lesions              Urethra:  normal appearing urethra with no masses, tenderness or lesions              Bartholin's and Skene's: normal                 Vagina: normal appearing vagina with normal color and discharge, no lesions              Cervix: anteverted              Pap taken: No. Bimanual Exam:  Uterus:  normal size, contour, position, consistency, mobility, non-tender              Adnexa: no mass, fullness, tenderness               Rectovaginal: Confirms               Anus:  normal sphincter tone, no lesions  Chaperone present: yes  A:  Well Woman with normal exam  Postmenopausal - no HRT S/P BTL and right ovary removed 11/91 Left knee arthritis - post partial knee replacement with revision on 07/05/14 History of atypical lobular hyperplasia of breast right  HTN, hypercholesterolemia  P:   Reviewed health and  wellness pertinent to exam  Pap smear as above  Mammogram is due 01/2017  IFOB is given and she will get labs done at PCP for Hep C and HIV  Counseled on breast self exam, mammography screening, adequate intake of calcium and vitamin  D, diet and exercise, Kegel's exercises return annually or prn  An After Visit Summary was printed and given to the patient.

## 2016-07-05 NOTE — Progress Notes (Signed)
Reviewed personally.  M. Suzanne Shmuel Girgis, MD.  

## 2016-07-06 ENCOUNTER — Ambulatory Visit: Payer: 59 | Admitting: Nurse Practitioner

## 2016-07-09 NOTE — Addendum Note (Signed)
Addended by: Kem Boroughs R on: 07/09/2016 11:32 AM   Modules accepted: Orders

## 2016-07-10 DIAGNOSIS — R899 Unspecified abnormal finding in specimens from other organs, systems and tissues: Secondary | ICD-10-CM | POA: Diagnosis not present

## 2016-07-10 DIAGNOSIS — Z1159 Encounter for screening for other viral diseases: Secondary | ICD-10-CM | POA: Diagnosis not present

## 2016-07-25 ENCOUNTER — Other Ambulatory Visit: Payer: Self-pay | Admitting: Family Medicine

## 2016-07-25 DIAGNOSIS — N189 Chronic kidney disease, unspecified: Secondary | ICD-10-CM

## 2016-07-31 ENCOUNTER — Ambulatory Visit
Admission: RE | Admit: 2016-07-31 | Discharge: 2016-07-31 | Disposition: A | Discharge status: Home / Self Care | Payer: Medicare Other | Source: Ambulatory Visit | Attending: Family Medicine | Admitting: Family Medicine

## 2016-07-31 ENCOUNTER — Other Ambulatory Visit: Payer: Medicare Other

## 2016-07-31 DIAGNOSIS — N189 Chronic kidney disease, unspecified: Secondary | ICD-10-CM

## 2016-08-06 LAB — FECAL OCCULT BLOOD, IMMUNOCHEMICAL: IMMUNOLOGICAL FECAL OCCULT BLOOD TEST: NEGATIVE

## 2016-08-06 NOTE — Addendum Note (Signed)
Addended by: Remigio Eisenmenger on: 08/06/2016 12:35 PM   Modules accepted: Orders

## 2016-08-14 ENCOUNTER — Other Ambulatory Visit: Payer: Self-pay | Admitting: Cardiology

## 2016-08-14 MED FILL — ROSUVASTATIN CALCIUM 5 MG T: 5 | 90 days supply | Qty: 90 | Fill #0

## 2016-08-14 NOTE — Telephone Encounter (Signed)
Rx request sent to pharmacy.  

## 2016-08-30 DIAGNOSIS — E785 Hyperlipidemia, unspecified: Secondary | ICD-10-CM | POA: Diagnosis not present

## 2016-08-30 DIAGNOSIS — R600 Localized edema: Secondary | ICD-10-CM | POA: Diagnosis not present

## 2016-08-30 DIAGNOSIS — N183 Chronic kidney disease, stage 3 (moderate): Secondary | ICD-10-CM | POA: Diagnosis not present

## 2016-08-30 DIAGNOSIS — Z6833 Body mass index (BMI) 33.0-33.9, adult: Secondary | ICD-10-CM | POA: Diagnosis not present

## 2016-09-06 DIAGNOSIS — R079 Chest pain, unspecified: Secondary | ICD-10-CM | POA: Diagnosis not present

## 2016-09-06 DIAGNOSIS — K219 Gastro-esophageal reflux disease without esophagitis: Secondary | ICD-10-CM | POA: Diagnosis not present

## 2016-09-11 MED FILL — DEXILANT DR 60 MG CAPSULE: 60 | 30 days supply | Qty: 30 | Fill #0

## 2016-09-12 DIAGNOSIS — Z23 Encounter for immunization: Secondary | ICD-10-CM | POA: Diagnosis not present

## 2016-09-25 MED FILL — TRIAMTERENE/HCTZ 75/50 TAB: 75-50 | 90 days supply | Qty: 90 | Fill #2

## 2016-10-04 ENCOUNTER — Other Ambulatory Visit: Payer: Self-pay | Admitting: *Deleted

## 2016-10-04 MED ORDER — ROSUVASTATIN CALCIUM 5 MG PO TABS: 5.0000 mg | ORAL_TABLET | Freq: Every day | ORAL | 3 refills | Status: DC

## 2016-10-05 ENCOUNTER — Other Ambulatory Visit: Payer: Self-pay

## 2016-10-05 MED ORDER — ROSUVASTATIN CALCIUM 5 MG PO TABS: 5.0000 mg | ORAL_TABLET | Freq: Every day | ORAL | 3 refills | Status: DC

## 2016-10-05 NOTE — Telephone Encounter (Signed)
Received fax from [facility] .Spoke with patient and she verified that she would like for her Crestor rx to be sent to [facility] due to a change in her insurance.

## 2016-10-15 MED FILL — DEXILANT DR 60 MG CAPSULE: 60 | 30 days supply | Qty: 30 | Fill #1

## 2016-11-19 MED FILL — DEXILANT DR 60 MG CAPSULE: 60 | 30 days supply | Qty: 30 | Fill #2

## 2016-11-30 DIAGNOSIS — K219 Gastro-esophageal reflux disease without esophagitis: Secondary | ICD-10-CM | POA: Diagnosis not present

## 2017-01-16 IMAGING — US US RENAL
1 series · 14 of 25 positions shown · non-contrast
Comparison: Abdominal MRI from 11/06/2005.

CLINICAL DATA: Chronic kidney disease with abnormal renal function.

EXAM:
RENAL / URINARY TRACT ULTRASOUND COMPLETE

[Series 1: us renal · 0.30mm/px · 14 of 47 slices shown]
[im 1/47]
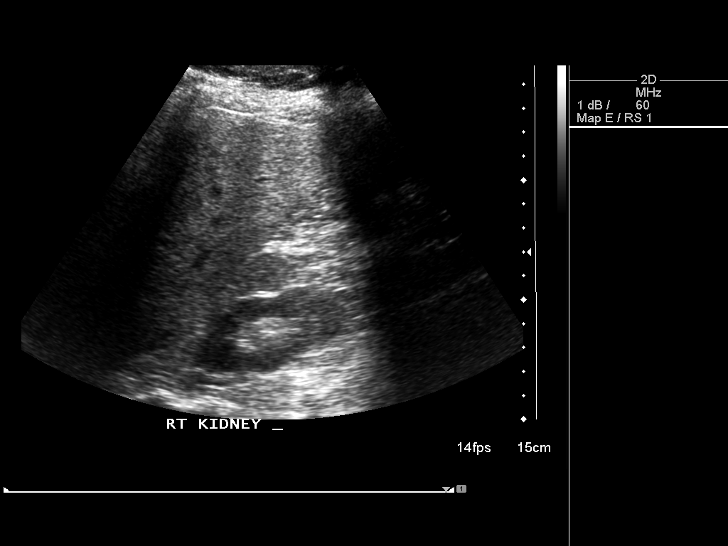
[im 4/47]
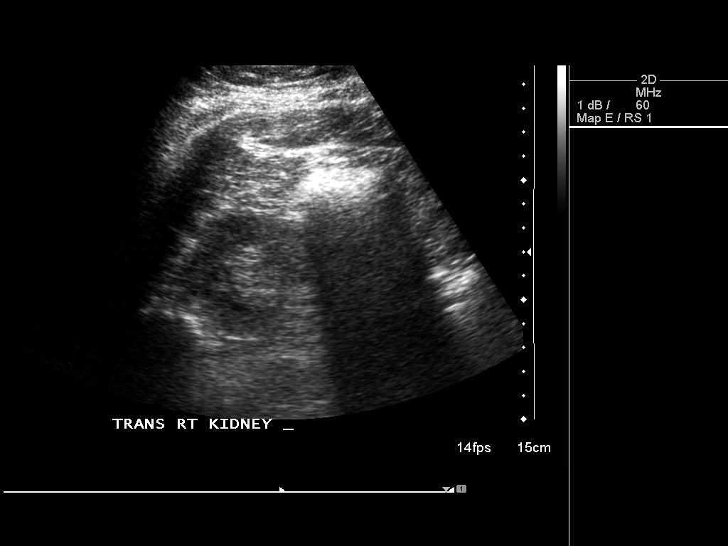
[im 8/47]
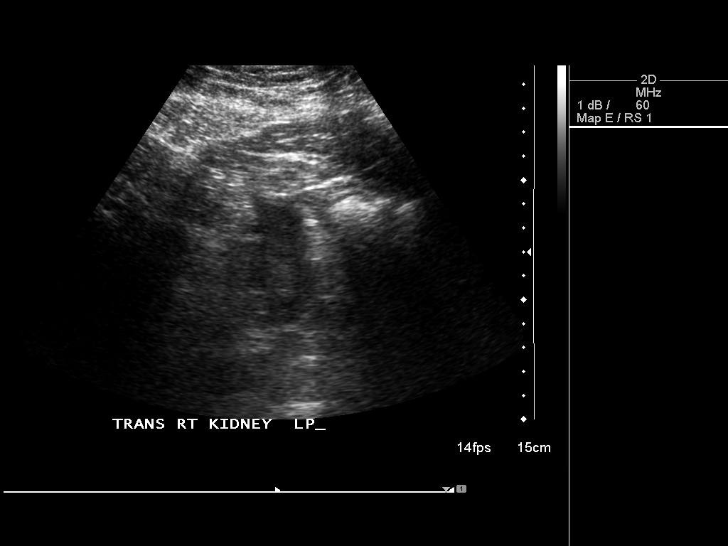
[im 12/47]
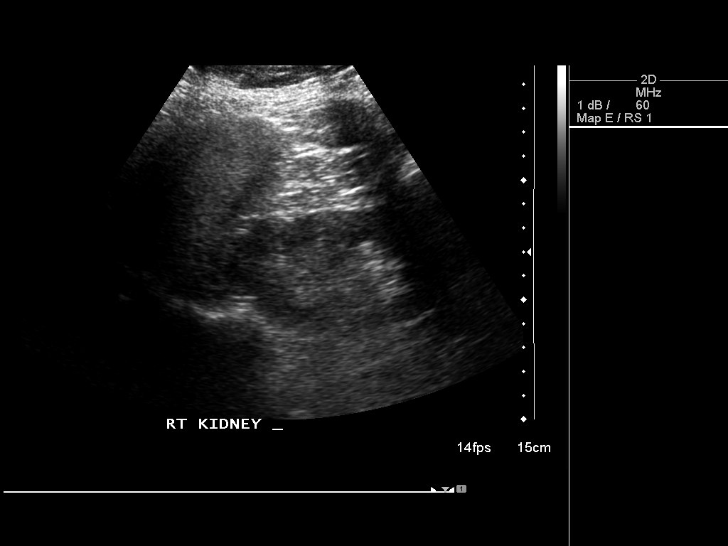
[im 16/47]
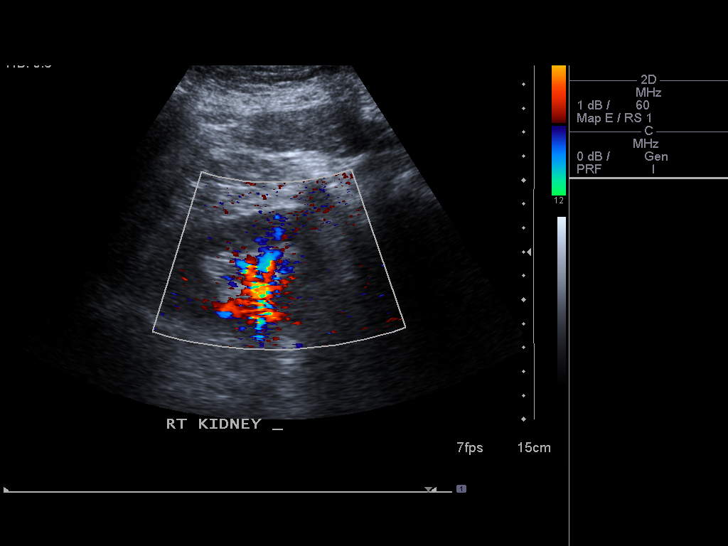
[im 18/47]
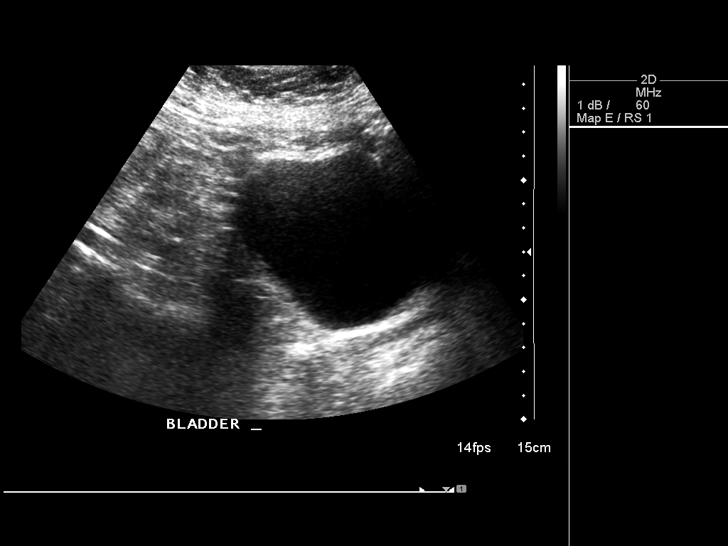
[im 22/47]
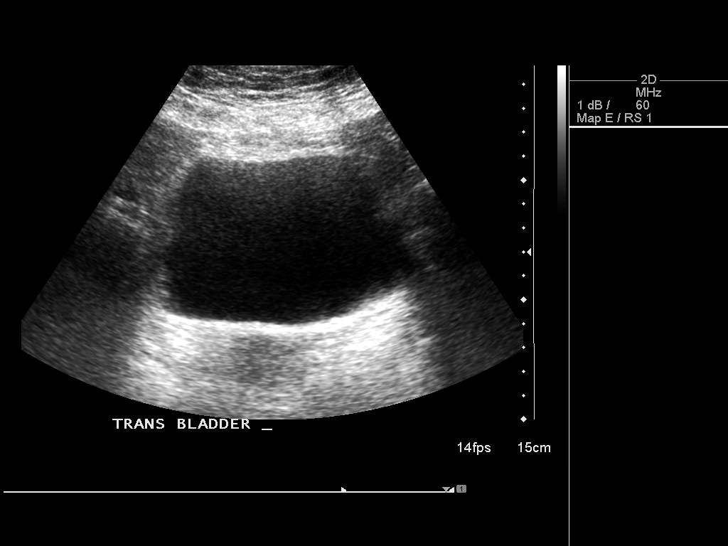
[im 25/47]
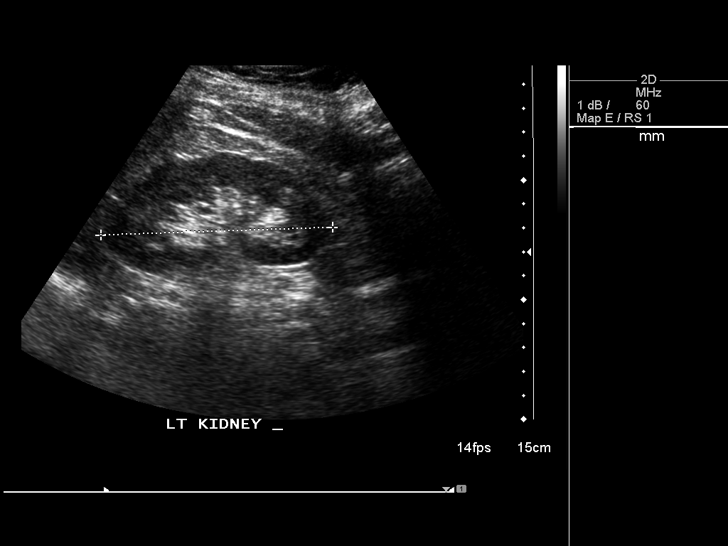
[im 29/47]
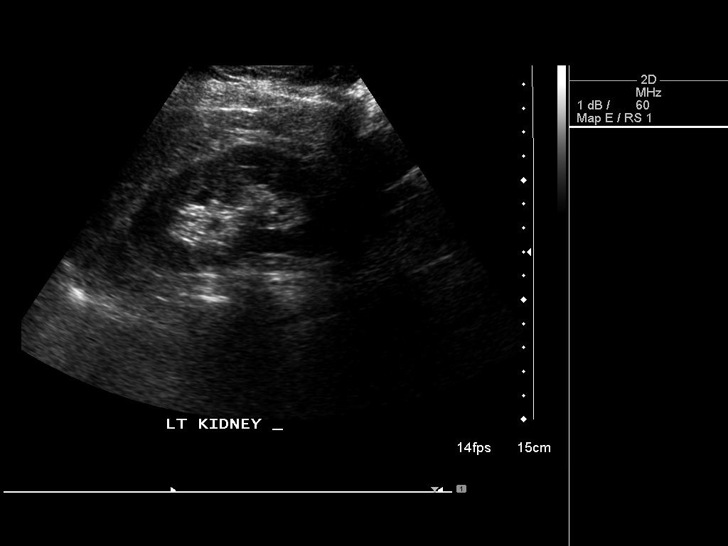
[im 31/47]
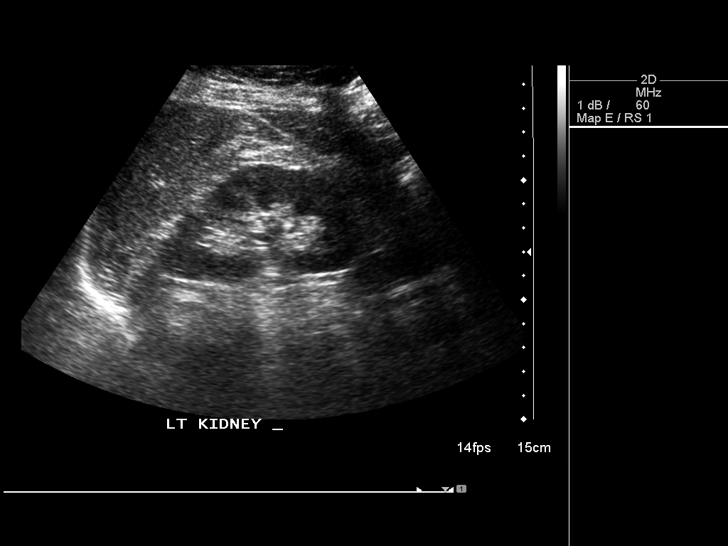
[im 35/47]
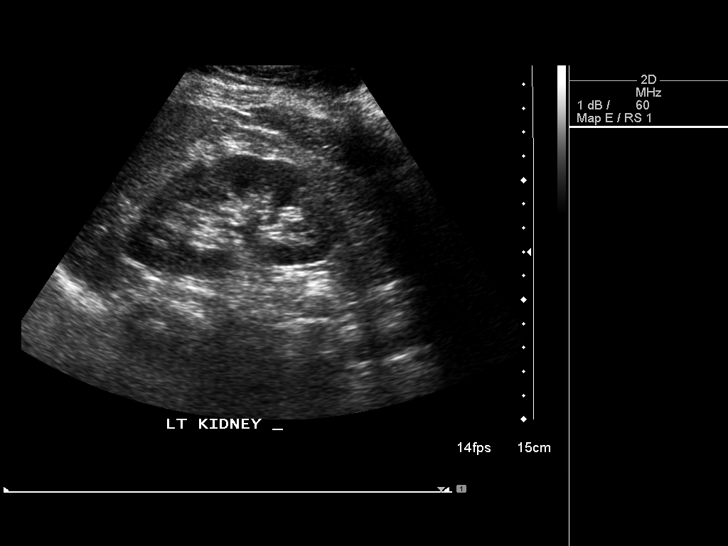
[im 39/47]
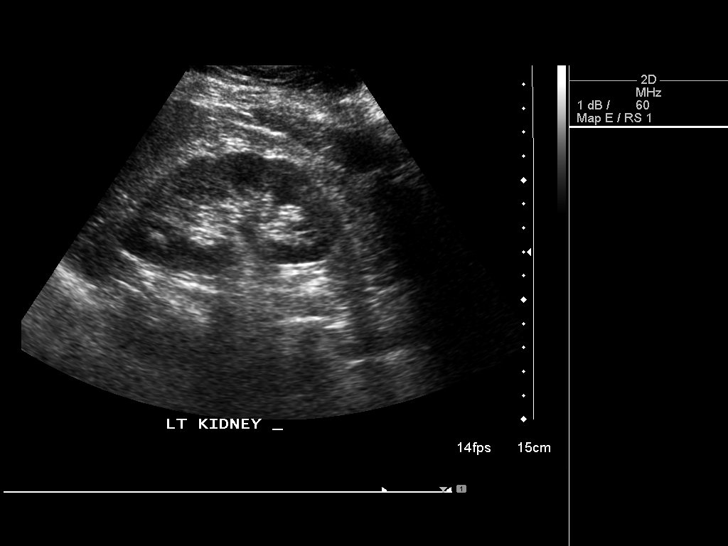
[im 43/47]
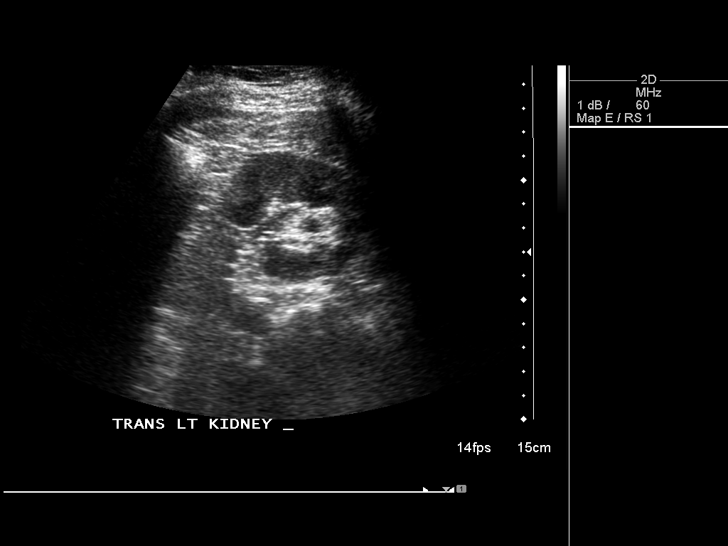
[im 47/47]
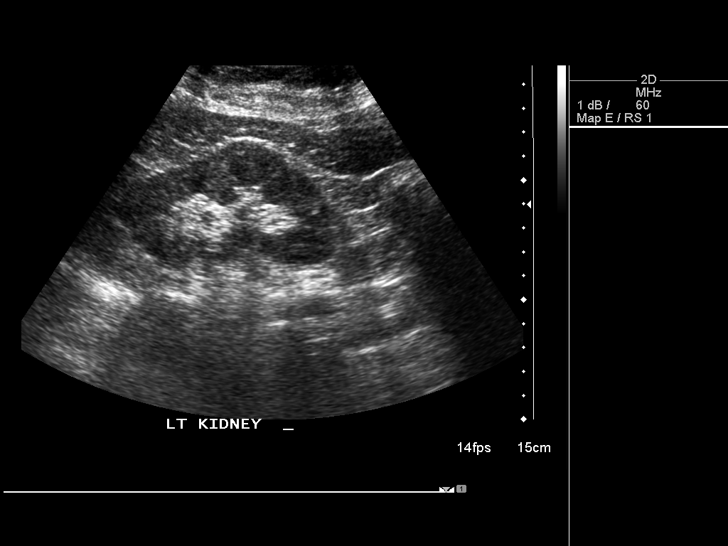

[14 of 25 positions shown; findings below may reference images not displayed]

FINDINGS: Right Kidney:

Length: 9.1 cm. Lower pole not well seen but echogenicity appears
within normal limits. No hydronephrosis. No discrete renal mass
lesion.

Left Kidney:

Length: 9.7 cm. Echogenicity within normal limits. No mass or
hydronephrosis visualized.

Bladder:

Appears normal for degree of bladder distention.
IMPRESSION: No evidence for hydronephrosis.

## 2017-01-23 ENCOUNTER — Other Ambulatory Visit: Payer: Self-pay | Admitting: Obstetrics & Gynecology

## 2017-01-23 DIAGNOSIS — Z1231 Encounter for screening mammogram for malignant neoplasm of breast: Secondary | ICD-10-CM

## 2017-02-20 ENCOUNTER — Ambulatory Visit
Admission: RE | Admit: 2017-02-20 | Discharge: 2017-02-20 | Disposition: A | Discharge status: Home / Self Care | Payer: Medicare Other | Source: Ambulatory Visit | Attending: Obstetrics & Gynecology | Admitting: Obstetrics & Gynecology

## 2017-02-20 DIAGNOSIS — Z1231 Encounter for screening mammogram for malignant neoplasm of breast: Secondary | ICD-10-CM | POA: Diagnosis not present

## 2017-04-25 ENCOUNTER — Encounter: Payer: Self-pay | Admitting: *Deleted

## 2017-05-01 DIAGNOSIS — Z79899 Other long term (current) drug therapy: Secondary | ICD-10-CM | POA: Diagnosis not present

## 2017-05-01 DIAGNOSIS — R609 Edema, unspecified: Secondary | ICD-10-CM | POA: Diagnosis not present

## 2017-05-01 DIAGNOSIS — Z23 Encounter for immunization: Secondary | ICD-10-CM | POA: Diagnosis not present

## 2017-05-01 DIAGNOSIS — K219 Gastro-esophageal reflux disease without esophagitis: Secondary | ICD-10-CM | POA: Diagnosis not present

## 2017-05-01 DIAGNOSIS — E782 Mixed hyperlipidemia: Secondary | ICD-10-CM | POA: Diagnosis not present

## 2017-05-01 DIAGNOSIS — N189 Chronic kidney disease, unspecified: Secondary | ICD-10-CM | POA: Diagnosis not present

## 2017-05-07 ENCOUNTER — Ambulatory Visit: Payer: Medicare Other | Admitting: Cardiology

## 2017-05-09 DIAGNOSIS — Z96652 Presence of left artificial knee joint: Secondary | ICD-10-CM | POA: Diagnosis not present

## 2017-05-09 DIAGNOSIS — Z471 Aftercare following joint replacement surgery: Secondary | ICD-10-CM | POA: Diagnosis not present

## 2017-05-09 DIAGNOSIS — M7632 Iliotibial band syndrome, left leg: Secondary | ICD-10-CM | POA: Diagnosis not present

## 2017-05-09 DIAGNOSIS — M1711 Unilateral primary osteoarthritis, right knee: Secondary | ICD-10-CM | POA: Diagnosis not present

## 2017-05-18 NOTE — Progress Notes (Signed)
Diane Fox Date of Birth: 1951-07-18 Medical Record #[id number]  History of Present Illness: Seen today for followup hypercholesterolemia. She has a history of hypercholesterolemia and is on Crestor therapy.  She had a normal stress Echo in April 2015. 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 She does have a chronic cough and has been extensively worked up by pulmonary. Seen by GI and diagnosed with LPR. Cough is much better now. Overall she feels well. She is exercising regularly. No chest pain or SOB.   Current Outpatient Prescriptions on File Prior to Visit  Medication Sig Dispense Refill  . B Complex-C (B-COMPLEX WITH VITAMIN C) tablet Take 1 tablet by mouth daily.    . chlorpheniramine (CHLOR-TRIMETON) 4 MG tablet Take 4 mg by mouth daily as needed for allergies.     . cholecalciferol (VITAMIN D) 1000 UNITS tablet Take 1,000 Units by mouth daily.    . Coenzyme Q10 (CO Q 10 PO) Take 1 tablet by mouth daily.    . famotidine (PEPCID) 20 MG tablet Take 20 mg by mouth 2 (two) times daily.    . fexofenadine (ALLEGRA) 180 MG tablet Take 180 mg by mouth daily.    . Multiple Vitamins-Minerals (CENTRUM SILVER PO) Take 1 tablet by mouth daily.    Marland Kitchen omeprazole (PRILOSEC) 20 MG capsule Take 20 mg by mouth daily.    . rosuvastatin (CRESTOR) 5 MG tablet Take 1 tablet (5 mg total) by mouth at bedtime. 90 tablet 3  . triamcinolone (NASACORT ALLERGY 24HR) 55 MCG/ACT AERO nasal inhaler Place 2 sprays into the nose daily.    Marland Kitchen triamterene-hydrochlorothiazide (MAXZIDE) 75-50 MG per tablet Take 1 tablet by mouth every morning.     . vitamin E (VITAMIN E) 400 UNIT capsule Take 400 Units by mouth daily.     No current facility-administered medications on file prior to visit.     Allergies  Allergen Reactions  . Biaxin [Clarithromycin] Hives  . Penicillins Hives  . Sulfa Antibiotics Hives  . Zostavax [Zoster Vaccine Live] Hives    Past Medical History:  Diagnosis Date  . Arthritis   .  Asthma    allergy related, no attacks  . Atypical chest pain march 2015   april stress test=normal  . 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 Edema    maxide prn  . Environmental allergies   . H/O measles   . H/O mumps   . Hypercholesterolemia   . Peri-menopausal    lexapro prn    Past Surgical History:  Procedure Laterality Date  . BREAST BIOPSY Right 09/25/2002  . breast bx  09/2002   breast sterootactec biopsy  . BREAST SURGERY Right 10/03   focal atypia hyperplasia  . COLONOSCOPY    . DILATION AND CURETTAGE OF UTERUS     x2  . ENDOMETRIAL BIOPSY  08/2010   proliferative endo  . HYSTEROSCOPY  2007   polyp removed-benign  . PATELLA-FEMORAL ARTHROPLASTY Left 11/23/2013   Procedure: LEFT KNEE PATELLA-FEMORAL ARTHROPLASTY;  Surgeon: Gearlean Alf, MD;  Location: [facility];  Service: Orthopedics;  Laterality: Left;  . POLYPECTOMY  07/2007   hysteroscopic with removal of polyp Dr. Sabra Heck  . RIGHT OOPHORECTOMY Right 11/91   tubal ligation with complications  . TOTAL KNEE ARTHROPLASTY Left 07/05/2014   Procedure: LEFT TOTAL KNEE ARTHROPLASTY;  Surgeon: Gearlean Alf, MD;  Location: [facility];  Service: Orthopedics;  Laterality: Left;  . TUBAL LIGATION  1991  .  WISDOM TOOTH EXTRACTION  early 20's    History  Smoking Status  . Never Smoker  Smokeless Tobacco  . Never Used    History  Alcohol Use  . 0.6 oz/week  . 1 Glasses of wine per week    Comment: social    Family History  Problem Relation Age of Onset  . Heart disease Father   . Heart attack Father   . Hypertension Father   . Heart disease Mother   . Hypertension Mother   . Asthma Brother   . Cancer Maternal Grandmother        lymphoma  . Cancer Maternal Grandfather        prostate cancer    Review of Systems: As noted in history of present illness. Positive for cough.  All other systems were reviewed and are negative.  Physical Exam: BP 136/75   Pulse 72   Ht 5\' 3"  (1.6 m)   Wt  181 lb 9.6 oz (82.4 kg)   LMP 10/27/2012   BMI 32.17 kg/m  The patient is alert and oriented x 3.  The carotids are 2+ without bruits.  There is no thyromegaly.  There is no JVD.  The lungs are clear.  The heart exam reveals a regular rate with a normal S1 and S2.  There are no murmurs, gallops, or rubs.  The PMI is not displaced.     Exam of the legs reveal no edema.   The distal pulses are intact.  Cranial nerves II - XII are intact.  Motor and sensory functions are intact.  The gait is normal.  LABORATORY DATA: ECG today demonstrates normal sinus rhythm with a nonspecific TWA. Rate 72 bpm.I have personally reviewed and interpreted this study.  Lab Results  Component Value Date   WBC 13.2 (H) 07/07/2014   HGB 12.7 07/07/2014   HCT 37.4 07/07/2014   PLT 322 07/07/2014   GLUCOSE 125 (H) 07/07/2014   CHOL 174 03/02/2014   TRIG 144.0 03/02/2014   HDL 60.90 03/02/2014   LDLCALC 84 03/02/2014   ALT 35 03/02/2014   AST 25 03/02/2014   NA 141 07/07/2014   K 3.9 07/07/2014   CL 101 07/07/2014   CREATININE 0.89 07/07/2014   BUN 18 07/07/2014   CO2 29 07/07/2014   INR 0.94 06/24/2014   Labs dated 05/01/17 reviewed: Normal  Chemistry panel and TSH. Cholesterol- 176, trig- 129, HDL- 61, LDL 89.   Assessment / Plan: 1. Hypercholesterolemia. At goal on crestor. Continue Rx. Encourage lifestyle modifications.    I will follow up in one year.

## 2017-05-20 ENCOUNTER — Encounter: Payer: Self-pay | Admitting: Cardiology

## 2017-05-20 ENCOUNTER — Ambulatory Visit (INDEPENDENT_AMBULATORY_CARE_PROVIDER_SITE_OTHER): Payer: Medicare Other | Admitting: Cardiology

## 2017-05-20 VITALS — BP 136/75 | HR 72 | Ht 63.0 in | Wt 181.6 lb

## 2017-05-20 DIAGNOSIS — E7800 Pure hypercholesterolemia, unspecified: Secondary | ICD-10-CM | POA: Diagnosis not present

## 2017-05-20 NOTE — Patient Instructions (Signed)
Continue your current therapy  I will see you in one year   

## 2017-05-21 DIAGNOSIS — H43811 Vitreous degeneration, right eye: Secondary | ICD-10-CM | POA: Diagnosis not present

## 2017-05-21 DIAGNOSIS — H2513 Age-related nuclear cataract, bilateral: Secondary | ICD-10-CM | POA: Diagnosis not present

## 2017-05-21 DIAGNOSIS — H52223 Regular astigmatism, bilateral: Secondary | ICD-10-CM | POA: Diagnosis not present

## 2017-05-21 DIAGNOSIS — H524 Presbyopia: Secondary | ICD-10-CM | POA: Diagnosis not present

## 2017-05-21 DIAGNOSIS — H5213 Myopia, bilateral: Secondary | ICD-10-CM | POA: Diagnosis not present

## 2017-05-21 DIAGNOSIS — H04123 Dry eye syndrome of bilateral lacrimal glands: Secondary | ICD-10-CM | POA: Diagnosis not present

## 2017-05-21 DIAGNOSIS — H11153 Pinguecula, bilateral: Secondary | ICD-10-CM | POA: Diagnosis not present

## 2017-06-30 DIAGNOSIS — K115 Sialolithiasis: Secondary | ICD-10-CM | POA: Diagnosis not present

## 2017-06-30 DIAGNOSIS — K118 Other diseases of salivary glands: Secondary | ICD-10-CM | POA: Diagnosis not present

## 2017-07-09 ENCOUNTER — Ambulatory Visit: Payer: Medicare Other | Admitting: Nurse Practitioner

## 2017-07-22 ENCOUNTER — Encounter: Payer: Self-pay | Admitting: Obstetrics & Gynecology

## 2017-07-22 ENCOUNTER — Ambulatory Visit (INDEPENDENT_AMBULATORY_CARE_PROVIDER_SITE_OTHER): Payer: Medicare Other | Admitting: Obstetrics & Gynecology

## 2017-07-22 ENCOUNTER — Other Ambulatory Visit (HOSPITAL_COMMUNITY)
Admission: RE | Admit: 2017-07-22 | Discharge: 2017-07-22 | Disposition: A | Discharge status: Home / Self Care | Payer: Medicare Other | Source: Ambulatory Visit | Attending: Obstetrics & Gynecology | Admitting: Obstetrics & Gynecology

## 2017-07-22 VITALS — BP 124/74 | HR 88 | Resp 14 | Ht 62.25 in | Wt 182.5 lb

## 2017-07-22 DIAGNOSIS — Z124 Encounter for screening for malignant neoplasm of cervix: Secondary | ICD-10-CM

## 2017-07-22 DIAGNOSIS — Z01419 Encounter for gynecological examination (general) (routine) without abnormal findings: Secondary | ICD-10-CM | POA: Diagnosis not present

## 2017-07-22 NOTE — Progress Notes (Signed)
66 y.o. [id number] MarriedCaucasianF here for annual exam.  Doing well.  Denies vaginal bleeding.    Cardiologist:  Dr. Martinique Pulmonology:  Dr. Chase Caller   Patient's last menstrual period was 10/27/2012.          Sexually active: No.  The current method of family planning is post menopausal status.    Exercising: Yes.    walking, yoga Smoker:  no  Health Maintenance: Pap:  07/05/15 negative, HR HPV negative, 05/27/12 negative  History of abnormal Pap:  no MMG:  02/21/17 BIRADS 1 negative  Colonoscopy:  2012- Dr. Cristina Gong- repeat 10 years  BMD:   01/25/16 normal  TDaP:  11/2016   Pneumonia vaccine(s):  PCP x 2 Shingrix:   05/24/17 at [place] [facility] testing: PCP Screening Labs: PCP, Hb today: PCP   reports that she has never smoked. She has never used smokeless tobacco. She reports that she drinks about 0.6 oz of alcohol per week . She reports that she does not use drugs.  Past Medical History:  Diagnosis Date  . Arthritis   . Asthma    allergy related, no attacks  . Atypical chest pain march 2015   april stress test=normal  . 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 Edema    maxide prn  . Environmental allergies   . H/O measles   . H/O mumps   . Hypercholesterolemia   . Peri-menopausal    lexapro prn    Past Surgical History:  Procedure Laterality Date  . BREAST BIOPSY Right 09/25/2002  . breast bx  09/2002   breast sterootactec biopsy  . BREAST SURGERY Right 10/03   focal atypia hyperplasia  . COLONOSCOPY    . DILATION AND CURETTAGE OF UTERUS     x2  . ENDOMETRIAL BIOPSY  08/2010   proliferative endo  . HYSTEROSCOPY  2007   polyp removed-benign  . PATELLA-FEMORAL ARTHROPLASTY Left 11/23/2013   Procedure: LEFT KNEE PATELLA-FEMORAL ARTHROPLASTY;  Surgeon: Gearlean Alf, MD;  Location: [facility];  Service: Orthopedics;  Laterality: Left;  . POLYPECTOMY  07/2007   hysteroscopic with removal of polyp Dr. Sabra Heck  . RIGHT OOPHORECTOMY Right  11/91   tubal ligation with complications  . TOTAL KNEE ARTHROPLASTY Left 07/05/2014   Procedure: LEFT TOTAL KNEE ARTHROPLASTY;  Surgeon: Gearlean Alf, MD;  Location: [facility];  Service: Orthopedics;  Laterality: Left;  . TUBAL LIGATION  1991  . WISDOM TOOTH EXTRACTION  early 20's    Current Outpatient Prescriptions  Medication Sig Dispense Refill  . B Complex-C (B-COMPLEX WITH VITAMIN C) tablet Take 1 tablet by mouth daily.    . chlorpheniramine (CHLOR-TRIMETON) 4 MG tablet Take 4 mg by mouth daily as needed for allergies.     . cholecalciferol (VITAMIN D) 1000 UNITS tablet Take 1,000 Units by mouth daily.    . Coenzyme Q10 (CO Q 10 PO) Take 1 tablet by mouth daily.    . fexofenadine (ALLEGRA) 180 MG tablet Take 180 mg by mouth daily.    . Multiple Vitamins-Minerals (CENTRUM SILVER PO) Take 1 tablet by mouth daily.    Marland Kitchen omeprazole (PRILOSEC) 20 MG capsule Take 20 mg by mouth daily.    . rosuvastatin (CRESTOR) 5 MG tablet Take 1 tablet (5 mg total) by mouth at bedtime. 90 tablet 3  . triamcinolone (NASACORT ALLERGY 24HR) 55 MCG/ACT AERO nasal inhaler Place 2 sprays into the nose daily.    Marland Kitchen triamterene-hydrochlorothiazide (MAXZIDE) 75-50  MG per tablet Take 1 tablet by mouth every morning.     . vitamin E (VITAMIN E) 400 UNIT capsule Take 400 Units by mouth daily.     No current facility-administered medications for this visit.     Family History  Problem Relation Age of Onset  . Heart disease Father   . Heart attack Father   . Hypertension Father   . Heart disease Mother   . Hypertension Mother   . Asthma Brother   . Cancer Maternal Grandmother        lymphoma  . Cancer Maternal Grandfather        prostate cancer    ROS:  Pertinent items are noted in HPI.  Otherwise, a comprehensive ROS was negative.  Exam:   BP 124/74 (BP Location: Right Arm, Patient Position: Sitting, Cuff Size: Large)   Pulse 88   Resp 14   Ht 5' 2.25" (1.581 m)   Wt 182 lb 8 oz (82.8 kg)   LMP  10/27/2012   BMI 33.11 kg/m   Weight change: +1#  Height: 5' 2.25" (158.1 cm)  Ht Readings from Last 3 Encounters:  07/22/17 5' 2.25" (1.581 m)  05/20/17 5\' 3"  (1.6 m)  07/04/16 5' 2.5" (1.588 m)    General appearance: alert, cooperative and appears stated age Head: Normocephalic, without obvious abnormality, atraumatic Neck: no adenopathy, supple, symmetrical, trachea midline and thyroid normal to inspection and palpation Lungs: clear to auscultation bilaterally Breasts: normal appearance, no masses or tenderness Heart: regular rate and rhythm Abdomen: soft, non-tender; bowel sounds normal; no masses,  no organomegaly Extremities: extremities normal, atraumatic, no cyanosis or edema Skin: Skin color, texture, turgor normal. No rashes or lesions Lymph nodes: Cervical, supraclavicular, and axillary nodes normal. No abnormal inguinal nodes palpated Neurologic: Grossly normal   Pelvic: External genitalia:  no lesions              Urethra:  normal appearing urethra with no masses, tenderness or lesions              Bartholins and Skenes: normal                 Vagina: normal appearing vagina with normal color and discharge, no lesions              Cervix: no lesions              Pap taken: Yes.   Bimanual Exam:  Uterus:  normal size, contour, position, consistency, mobility, non-tender              Adnexa: normal adnexa and no mass, fullness, tenderness               Rectovaginal: Confirms               Anus:  normal sphincter tone, no lesions  Chaperone was present for exam.  A:  Well Woman with normal exam PMP, no HRT S/p BTL and then 11/91 H/O atypical lobular hyperplasia of right breast s/p lumpectomy LE edema Hypercholesterolemia  P:   Mammogram guidelines reviewed.  Doing 3D MMGs. pap smear obtained today Lab work UTD return annually or prn

## 2017-07-23 LAB — CYTOLOGY - PAP: DIAGNOSIS: NEGATIVE

## 2017-08-09 ENCOUNTER — Telehealth: Payer: Self-pay

## 2017-08-09 NOTE — Telephone Encounter (Signed)
Received surgical clearance from [facility].Dr.Jordan cleared patient for upcoming surgery.Clearance faxed back to fax # [phone].

## 2017-09-10 DIAGNOSIS — Z23 Encounter for immunization: Secondary | ICD-10-CM | POA: Diagnosis not present

## 2017-10-06 DIAGNOSIS — M79671 Pain in right foot: Secondary | ICD-10-CM | POA: Diagnosis not present

## 2017-10-06 DIAGNOSIS — R600 Localized edema: Secondary | ICD-10-CM | POA: Diagnosis not present

## 2017-10-08 ENCOUNTER — Encounter: Payer: Self-pay | Admitting: Cardiology

## 2017-10-09 DIAGNOSIS — M1711 Unilateral primary osteoarthritis, right knee: Secondary | ICD-10-CM | POA: Diagnosis not present

## 2017-10-13 ENCOUNTER — Ambulatory Visit: Payer: Self-pay | Admitting: Orthopedic Surgery

## 2017-10-14 ENCOUNTER — Telehealth: Payer: Self-pay | Admitting: *Deleted

## 2017-10-14 NOTE — Telephone Encounter (Signed)
PATIENT OF DR Martinique      [facility] Pre-operative Risk Assessment    Request for surgical clearance:  1. What type of surgery is being performed? RIGHT:TKA-MEDIAL & LATERAL W/WO PATELLA RESURFACING  2. When is this surgery scheduled? 11-11-17  3. Are there any medications that need to be held prior to surgery and how long?N/A  4. Practice name and name of physician performing surgery? [facility]---DR Pilar Plate ALUISIO  5. What is your office phone and fax number? PHONE [place] [phone]  6. Anesthesia type (None, local, MAC, general) ? ---   Raiford Simmonds 10/14/2017, 5:39 PM  _________________________________________________________________   (provider comments below)

## 2017-10-15 ENCOUNTER — Other Ambulatory Visit: Payer: Self-pay | Admitting: Cardiology

## 2017-10-15 NOTE — Telephone Encounter (Signed)
Chart reviewed per pre-op protocol. Spoke with significant other regarding need of call. Please will call back between 1:30-5pm and ask for pre-op covering staff.    Dalores Weger, [place]

## 2017-10-15 NOTE — Telephone Encounter (Signed)
    Chart reviewed as part of pre-operative protocol coverage. Patient was contacted 10/15/2017 in reference to pre-operative risk assessment for pending surgery as outlined below.  Diane Fox was last seen on 05/20/17 by Dr. Martinique.  Since that day, Diane Fox has done well. DASI is 7.53 METS.   Therefore, based on ACC/AHA guidelines, the patient would be at acceptable risk for the planned procedure without further cardiovascular testing.   [place], [place] 10/15/2017, 4:02 PM

## 2017-10-16 NOTE — Telephone Encounter (Signed)
REFILL 

## 2017-10-16 NOTE — Telephone Encounter (Signed)
Sent to [employer] via [employer]

## 2017-10-21 ENCOUNTER — Encounter: Payer: Self-pay | Admitting: Podiatry

## 2017-10-21 ENCOUNTER — Ambulatory Visit (INDEPENDENT_AMBULATORY_CARE_PROVIDER_SITE_OTHER): Payer: Medicare Other | Admitting: Podiatry

## 2017-10-21 ENCOUNTER — Ambulatory Visit (INDEPENDENT_AMBULATORY_CARE_PROVIDER_SITE_OTHER): Payer: Medicare Other

## 2017-10-21 VITALS — BP 142/89 | HR 96 | Ht 63.5 in | Wt 180.0 lb

## 2017-10-21 DIAGNOSIS — M79671 Pain in right foot: Secondary | ICD-10-CM

## 2017-10-21 DIAGNOSIS — M659 Synovitis and tenosynovitis, unspecified: Secondary | ICD-10-CM

## 2017-10-21 NOTE — Progress Notes (Signed)
   Subjective:    Patient ID: Diane Fox, female    DOB: [birth year], 66 y.o.   MRN: [id number]  HPI  Chief Complaint  Patient presents with  . Foot Pain    right ankle pain and swelling - Xrays done x 2 weeks ago at [facility]  . Difficulty Walking    Scheduled for knee replacement left in a few weeks       Review of Systems  Musculoskeletal: Positive for arthralgias and joint swelling.       Objective:   Physical Exam        Assessment & Plan:

## 2017-10-23 DIAGNOSIS — M25561 Pain in right knee: Secondary | ICD-10-CM | POA: Diagnosis not present

## 2017-10-23 DIAGNOSIS — E669 Obesity, unspecified: Secondary | ICD-10-CM | POA: Diagnosis not present

## 2017-10-23 DIAGNOSIS — Z6832 Body mass index (BMI) 32.0-32.9, adult: Secondary | ICD-10-CM | POA: Diagnosis not present

## 2017-10-24 NOTE — Progress Notes (Signed)
   Subjective:  Patient presents today for pain and tenderness to the right ankle and foot onset one month ago. She reports associated swelling of the right ankle. Patient relates significant pain and tenderness when walking. Wearing compression socks, icing and elevating the area helps alleviate the pain. She is scheduled to have knee replacement surgery in the next few weeks and wants to address the ankle pain first. Patient presents for further treatment and evaluation.    Past Medical History:  Diagnosis Date  . Arthritis   . Asthma    allergy related, no attacks  . Atypical chest pain march 2015   april stress test=normal  . 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 Edema    maxide prn  . Environmental allergies   . H/O measles   . H/O mumps   . Hypercholesterolemia   . Peri-menopausal    lexapro prn     Objective / Physical Exam:  General:  The patient is alert and oriented x3 in no acute distress. Dermatology:  Skin is warm, dry and supple bilateral lower extremities. Negative for open lesions or macerations. Vascular:  Palpable pedal pulses bilaterally. No edema or erythema noted. Capillary refill within normal limits. Neurological:  Epicritic and protective threshold grossly intact bilaterally.  Musculoskeletal Exam:  Pain on palpation to the anterior lateral medial aspects of the patient's right ankle. Mild edema noted.  Range of motion within normal limits to all pedal and ankle joints bilateral. Muscle strength 5/5 in all groups bilateral.   Radiographic Exam:  Normal osseous mineralization. Joint spaces preserved. No fracture/dislocation/boney destruction.    Assessment: #1 pain in right ankle #2 synovitis of right ankle  Plan of Care:  #1 Patient was evaluated. X-Rays reviewed. #2 Recommended good shoe gear. #3 Continue wearing compression ankle sleeve. #4 Okay to proceed with knee replacement surgery from a podiatry standpoint.  #5  Return to clinic when necessary.  Edrick Kins, DPM [facility]  Dr. Edrick Kins, [address]                                        [place], [place] [address]                Office [phone]  Fax [phone]

## 2017-11-02 ENCOUNTER — Ambulatory Visit: Payer: Self-pay | Admitting: Orthopedic Surgery

## 2017-11-02 NOTE — H&P (Signed)
[name] DOB: Apr 14, 1951 Married / Language: [name] / Race: White Female Date of admission: November 11, 2017  Chief complaint: Right knee pain History of Present Illness  The patient is a 66 year old female who comes in  for a preoperative History and Physical. The patient is scheduled for a right total knee arthroplasty to be performed by Dr. Dione Plover. Aluisio, MD at [facility] on 11-11-2017. The patient is a 66 year old female who has been followed for their bilateral knee pain and osteoarthritis. They are now 2 year(s) (and 10 months) out from left TKA. Symptoms reported include: pain at night (lateral left; getting worse with no injury) and grinding (on the right). The patient feels that they are doing poorly. The following medication has been used for pain control: Tylenol. The patient has reported symptom improvement with: Cortisone injections - helped some, while they have not gotten any relief of their symptoms with: viscosupplementation. She states that the left knee has been somewhat sore lately, we replaced it a couple years ago. She is almost three years out. She has a little soreness on the lateral part of the knee. There is no swelling associated with it. It is not giving out on her. She did not have any injury leading to that. Her right knee is worse than the left. She has got documented significant arthritis. The knee is giving her a lot of trouble. It is limiting what she can and cannot do. Cortisone has helped in the past, but viscous supplements have not. She is ready to get the knee fixed. They have been treated conservatively in the past for the above stated problem and despite conservative measures, they continue to have progressive pain and severe functional limitations and dysfunction. They have failed non-operative management including home exercise, medications, and injections. It is felt that they would benefit from undergoing total joint replacement. Risks and  benefits of the procedure have been discussed with the patient and they elect to proceed with surgery. There are no active contraindications to surgery such as ongoing infection or rapidly progressive neurological disease.  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 Penicillins  Hives. Biaxin *MACROLIDES*  Hives.  Family History Hypertension  mother and father Cerebrovascular Accident  First Degree Relatives. mother Heart Disease  father Congestive Heart Failure  father Cancer  grandmother mothers side and grandfather mothers side  Social History Exercise  Exercises daily; does running / walking Drug/Alcohol Rehab (Currently)  no Alcohol use  current drinker; drinks beer, wine and hard liquor; only occasionally per week Tobacco use  Never smoker. never smoker Marital status  married Drug/Alcohol Rehab (Previously)  no Illicit drug use  no Number of flights of stairs before winded  1 Pain Contract  no Children  2 Tobacco / smoke exposure  no Current work status  working full time Living situation  live with spouse [place] With Family. Advance Directives  Living Will, Healthcare POA  Medication History  Centrum Silver (Oral) Active. Aspirin EC (325MG  Tablet DR, Oral) Active. Tylenol Extra Strength (500MG  Tablet, Oral) Active. Maxzide (75-50MG  Tablet, Oral as needed) Active. Maxidone (10-750MG  Tablet, Oral)  Active. Crestor (5MG  Tablet, Oral) Active. Lexapro (10MG  Tablet, Oral) Active.  Past Surgical History  Dilation and Curettage of  Uterus - Multiple  Breast Biopsy  right Wisdom Teeth Extraction  Date: 29. Tubal Ligation  Date: 16. Hysteroscopy  Date: 2007. Colonoscopy    Review of Systems General Not Present- Chills, Fatigue, Fever, Memory Loss, Night Sweats, Weight Gain and Weight Loss. Skin Not Present- Eczema, Hives, Itching, Lesions and Rash. HEENT Not Present- Dentures, Double Vision, Headache, Hearing Loss, Tinnitus and Visual Loss. Respiratory Present- Chronic Cough. Not Present- Allergies, Coughing up blood, Shortness of breath at rest and Shortness of breath with exertion. Cardiovascular Present- Swelling of Extremities. Not Present- Chest Pain, Difficulty Breathing Lying Down, Murmur, Palpitations, Racing/skipping heartbeats and Swelling. Gastrointestinal Not Present- Abdominal Pain, Bloody Stool, Constipation, Diarrhea, Difficulty Swallowing, Heartburn, Jaundice, Loss of appetitie, Nausea and Vomiting. Female Genitourinary Not Present- Blood in Urine, Discharge, Flank Pain, Incontinence, Painful Urination, Urgency, Urinary frequency, Urinary Retention, Urinating at Night and Weak urinary stream. Musculoskeletal Present- Joint Pain and Joint Stiffness. Not Present- Back Pain, Joint Swelling, Morning Stiffness, Muscle Pain, Muscle Weakness and Spasms. Neurological Not Present- Blackout spells, Difficulty with balance, Dizziness, Paralysis, Tremor and Weakness. Psychiatric Not Present- Insomnia.  Vitals Weight: 180 lb Height: 63in Weight was reported by patient. Height was reported by patient. Body Surface Area: 1.85 m Body Mass Index: 31.89 kg/m  Pulse: 68 (Regular)  BP: 138/68 (Sitting, Right Arm, Standard)   Physical Exam General Mental Status -Alert, cooperative and good historian. General Appearance-pleasant, Not in acute distress. Orientation-Oriented X3. Build & Nutrition-Well nourished and Well developed.  Head and Neck Head-normocephalic, atraumatic  . Neck Global Assessment - supple, no bruit auscultated on the right, no bruit auscultated on the left.  Eye Vision-Wears corrective lenses. Pupil - Bilateral-Regular and Round. Motion - Bilateral-EOMI.  Chest and Lung Exam Auscultation Breath sounds - clear at anterior chest wall and clear at posterior chest wall. Adventitious sounds - No Adventitious sounds.  Cardiovascular Auscultation Rhythm - Regular rate and rhythm. Heart Sounds - S1 WNL and S2 WNL. Murmurs & Other Heart Sounds - Auscultation of the heart reveals - No Murmurs.  Abdomen Palpation/Percussion Tenderness - Abdomen is non-tender to palpation. Rigidity (guarding) - Abdomen is soft. Auscultation Auscultation of the abdomen reveals - Bowel sounds normal.  Female Genitourinary Note: Not done, not pertinent to present illness   Musculoskeletal Note: Her left knee shows no effusion. Range of motion 0 to 125. There is no crepitus on range of motion. There is no other tenderness noted. Her right knee, no effusion. Range about 0 to 125, marked crepitus on range of motion, some tenderness medial and lateral with no instability.  Her radiographs, AP, lateral of both knee show the prosthesis on the left in excellent position with no periprosthetic abnormalities. On the right, she has a bone-on-bone patellofemoral and a significant narrowing in the medial and lateral compartment with squaring of the femoral condyles of both compartments.     Assessment & Plan Status post total left knee replacement (F81.017) Primary osteoarthritis of right knee (M17.11)  Note:Surgical Plans: Right Total Knee Replacement  Disposition: Home, Outpatient at [place]  PCP: Dr. Hulan Fess - Patient has been seen preoperatively and felt to be stable for surgery.  Cards: Dr. Martinique - Patient has been seen preoperatively and felt to be stable for surgery.  IV TXA  Anesthesia Issues: None  Patient was instructed on what  medications to stop prior to surgery.  Signed electronically  by Joelene Millin, III PA-C

## 2017-11-02 NOTE — H&P (View-Only) (Signed)
Diane Fox DOB: [birth year] Married / Language: Diane Fox / Race: White Female Date of admission: November 11, 2017  Chief complaint: Right knee pain History of Present Illness  The patient is a 66 year old female who comes in  for a preoperative History and Physical. The patient is scheduled for a right total knee arthroplasty to be performed by Dr. Dione Plover. Aluisio, MD at [facility] on 11-11-2017. The patient is a 66 year old female who has been followed for their bilateral knee pain and osteoarthritis. They are now 2 year(s) (and 10 months) out from left TKA. Symptoms reported include: pain at night (lateral left; getting worse with no injury) and grinding (on the right). The patient feels that they are doing poorly. The following medication has been used for pain control: Tylenol. The patient has reported symptom improvement with: Cortisone injections - helped some, while they have not gotten any relief of their symptoms with: viscosupplementation. She states that the left knee has been somewhat sore lately, we replaced it a couple years ago. She is almost three years out. She has a little soreness on the lateral part of the knee. There is no swelling associated with it. It is not giving out on her. She did not have any injury leading to that. Her right knee is worse than the left. She has got documented significant arthritis. The knee is giving her a lot of trouble. It is limiting what she can and cannot do. Cortisone has helped in the past, but viscous supplements have not. She is ready to get the knee fixed. They have been treated conservatively in the past for the above stated problem and despite conservative measures, they continue to have progressive pain and severe functional limitations and dysfunction. They have failed non-operative management including home exercise, medications, and injections. It is felt that they would benefit from undergoing total joint replacement. Risks and  benefits of the procedure have been discussed with the patient and they elect to proceed with surgery. There are no active contraindications to surgery such as ongoing infection or rapidly progressive neurological disease.  Problem List/Past Medical  Status post total left knee replacement (W43.154)  Primary osteoarthritis of right knee (M17.11)  Iliotibial band tendonitis, left (M76.32)  Asthma  Hypercholesterolemia  Measles  Mumps  Rubella  Menopause  Breast disease  Atypical Lobular Hyperplasia Localized osteoarthritis of right knee (M17.11)  Gastroesophageal Reflux Disease  Chronic kidney disease   Allergies Sulfa Drugs  Hives. Penicillins  Hives. Biaxin *MACROLIDES*  Hives.  Family History Hypertension  mother and father Cerebrovascular Accident  First Degree Relatives. mother Heart Disease  father Congestive Heart Failure  father Cancer  grandmother mothers side and grandfather mothers side  Social History Exercise  Exercises daily; does running / walking Drug/Alcohol Rehab (Currently)  no Alcohol use  current drinker; drinks beer, wine and hard liquor; only occasionally per week Tobacco use  Never smoker. never smoker Marital status  married Drug/Alcohol Rehab (Previously)  no Illicit drug use  no Number of flights of stairs before winded  1 Pain Contract  no Children  2 Tobacco / smoke exposure  no Current work status  working full time Living situation  live with spouse [place] With Family. Advance Directives  Living Will, Healthcare POA  Medication History  Centrum Silver (Oral) Active. Aspirin EC (325MG  Tablet DR, Oral) Active. Tylenol Extra Strength (500MG  Tablet, Oral) Active. Maxzide (75-50MG  Tablet, Oral as needed) Active. Maxidone (10-750MG  Tablet, Oral)  Active. Crestor (5MG  Tablet, Oral) Active. Lexapro (10MG  Tablet, Oral) Active.  Past Surgical History  Dilation and Curettage of  Uterus - Multiple  Breast Biopsy  right Wisdom Teeth Extraction  Date: 64. Tubal Ligation  Date: 66. Hysteroscopy  Date: 2007. Colonoscopy    Review of Systems General Not Present- Chills, Fatigue, Fever, Memory Loss, Night Sweats, Weight Gain and Weight Loss. Skin Not Present- Eczema, Hives, Itching, Lesions and Rash. HEENT Not Present- Dentures, Double Vision, Headache, Hearing Loss, Tinnitus and Visual Loss. Respiratory Present- Chronic Cough. Not Present- Allergies, Coughing up blood, Shortness of breath at rest and Shortness of breath with exertion. Cardiovascular Present- Swelling of Extremities. Not Present- Chest Pain, Difficulty Breathing Lying Down, Murmur, Palpitations, Racing/skipping heartbeats and Swelling. Gastrointestinal Not Present- Abdominal Pain, Bloody Stool, Constipation, Diarrhea, Difficulty Swallowing, Heartburn, Jaundice, Loss of appetitie, Nausea and Vomiting. Female Genitourinary Not Present- Blood in Urine, Discharge, Flank Pain, Incontinence, Painful Urination, Urgency, Urinary frequency, Urinary Retention, Urinating at Night and Weak urinary stream. Musculoskeletal Present- Joint Pain and Joint Stiffness. Not Present- Back Pain, Joint Swelling, Morning Stiffness, Muscle Pain, Muscle Weakness and Spasms. Neurological Not Present- Blackout spells, Difficulty with balance, Dizziness, Paralysis, Tremor and Weakness. Psychiatric Not Present- Insomnia.  Vitals Weight: 180 lb Height: 63in Weight was reported by patient. Height was reported by patient. Body Surface Area: 1.85 m Body Mass Index: 31.89 kg/m  Pulse: 68 (Regular)  BP: 138/68 (Sitting, Right Arm, Standard)   Physical Exam General Mental Status -Alert, cooperative and good historian. General Appearance-pleasant, Not in acute distress. Orientation-Oriented X3. Build & Nutrition-Well nourished and Well developed.  Head and Neck Head-normocephalic, atraumatic  . Neck Global Assessment - supple, no bruit auscultated on the right, no bruit auscultated on the left.  Eye Vision-Wears corrective lenses. Pupil - Bilateral-Regular and Round. Motion - Bilateral-EOMI.  Chest and Lung Exam Auscultation Breath sounds - clear at anterior chest wall and clear at posterior chest wall. Adventitious sounds - No Adventitious sounds.  Cardiovascular Auscultation Rhythm - Regular rate and rhythm. Heart Sounds - S1 WNL and S2 WNL. Murmurs & Other Heart Sounds - Auscultation of the heart reveals - No Murmurs.  Abdomen Palpation/Percussion Tenderness - Abdomen is non-tender to palpation. Rigidity (guarding) - Abdomen is soft. Auscultation Auscultation of the abdomen reveals - Bowel sounds normal.  Female Genitourinary Note: Not done, not pertinent to present illness   Musculoskeletal Note: Her left knee shows no effusion. Range of motion 0 to 125. There is no crepitus on range of motion. There is no other tenderness noted. Her right knee, no effusion. Range about 0 to 125, marked crepitus on range of motion, some tenderness medial and lateral with no instability.  Her radiographs, AP, lateral of both knee show the prosthesis on the left in excellent position with no periprosthetic abnormalities. On the right, she has a bone-on-bone patellofemoral and a significant narrowing in the medial and lateral compartment with squaring of the femoral condyles of both compartments.     Assessment & Plan Status post total left knee replacement (E32.122) Primary osteoarthritis of right knee (M17.11)  Note:Surgical Plans: Right Total Knee Replacement  Disposition: Home, Outpatient at [place]  PCP: Dr. Hulan Fess - Patient has been seen preoperatively and felt to be stable for surgery.  Cards: Dr. Martinique - Patient has been seen preoperatively and felt to be stable for surgery.  IV TXA  Anesthesia Issues: None  Patient was instructed on what  medications to stop prior to surgery.  Signed electronically  by Joelene Millin, III PA-C

## 2017-11-04 ENCOUNTER — Encounter (HOSPITAL_COMMUNITY): Payer: Self-pay

## 2017-11-04 ENCOUNTER — Other Ambulatory Visit: Payer: Self-pay

## 2017-11-04 ENCOUNTER — Encounter (HOSPITAL_COMMUNITY)
Admission: RE | Admit: 2017-11-04 | Discharge: 2017-11-04 | Disposition: A | Discharge status: Home / Self Care | Payer: Medicare Other | Source: Ambulatory Visit | Attending: Orthopedic Surgery | Admitting: Orthopedic Surgery

## 2017-11-04 DIAGNOSIS — Z01812 Encounter for preprocedural laboratory examination: Secondary | ICD-10-CM | POA: Diagnosis not present

## 2017-11-04 HISTORY — DX: Nausea with vomiting, unspecified: R11.2

## 2017-11-04 HISTORY — DX: Chronic kidney disease, unspecified: N18.9

## 2017-11-04 HISTORY — DX: Other specified postprocedural states: Z98.890

## 2017-11-04 HISTORY — DX: Gastro-esophageal reflux disease without esophagitis: K21.9

## 2017-11-04 LAB — CBC
HEMATOCRIT: 42.5 % (ref 36.0–46.0)
Hemoglobin: 14.7 g/dL (ref 12.0–15.0)
MCH: 30.6 pg (ref 26.0–34.0)
MCHC: 34.6 g/dL (ref 30.0–36.0)
MCV: 88.5 fL (ref 78.0–100.0)
Platelets: 353 10*3/uL (ref 150–400)
RBC: 4.8 MIL/uL (ref 3.87–5.11)
RDW: 13.2 % (ref 11.5–15.5)
WBC: 7.6 10*3/uL (ref 4.0–10.5)

## 2017-11-04 LAB — COMPREHENSIVE METABOLIC PANEL
ALT: 29 U/L (ref 14–54)
AST: 23 U/L (ref 15–41)
Albumin: 4.2 g/dL (ref 3.5–5.0)
Alkaline Phosphatase: 87 U/L (ref 38–126)
Anion gap: 10 (ref 5–15)
BILIRUBIN TOTAL: 0.8 mg/dL (ref 0.3–1.2)
BUN: 27 mg/dL — AB (ref 6–20)
CO2: 29 mmol/L (ref 22–32)
Calcium: 9.5 mg/dL (ref 8.9–10.3)
Chloride: 99 mmol/L — ABNORMAL LOW (ref 101–111)
Creatinine, Ser: 1.13 mg/dL — ABNORMAL HIGH (ref 0.44–1.00)
GFR, EST AFRICAN AMERICAN: 57 mL/min — AB (ref >60)
GFR, EST NON AFRICAN AMERICAN: 50 mL/min — AB (ref >60)
Glucose, Bld: 86 mg/dL (ref 65–99)
POTASSIUM: 3.9 mmol/L (ref 3.5–5.1)
Sodium: 138 mmol/L (ref 135–145)
TOTAL PROTEIN: 7.5 g/dL (ref 6.5–8.1)

## 2017-11-04 LAB — APTT: aPTT: 33 seconds (ref 24–36)

## 2017-11-04 LAB — PROTIME-INR
INR: 0.94
PROTHROMBIN TIME: 12.4 s (ref 11.4–15.2)

## 2017-11-04 LAB — SURGICAL PCR SCREEN
MRSA, PCR: NEGATIVE
STAPHYLOCOCCUS AUREUS: NEGATIVE

## 2017-11-04 NOTE — Progress Notes (Signed)
CMP done 11/04/17 dfaxed via epic to Dr Wynelle Link.

## 2017-11-04 NOTE — Patient Instructions (Addendum)
Diane Fox  11/04/2017   Your procedure is scheduled on: 11/11/2017    Report to [facility] Main  Entrance .  Report to admitting at   0600 AM Follow signs to Short Stay on first floor at AM  Call this number if you have problems the morning of surgery  [phone]   Remember: ONLY 1 PERSON MAY GO WITH YOU TO SHORT STAY TO GET  [place].  Do not eat food or drink liquids :After Midnight.     Take these medicines the morning of surgery with A SIP OF WATER: Allegra, Prilosec, nasacort Inhaler                                 You may not have any metal on your body including hair pins and              piercings  Do not wear jewelry, make-up, lotions, powders or perfumes, deodorant             Do not wear nail polish.  Do not shave  48 hours prior to surgery.           Do not bring valuables to the hospital. [place].  Contacts, dentures or bridgework may not be worn into surgery.  Leave suitcase in the car. After surgery it may be brought to your room.                   Please read over the following fact sheets you were given: _____________________________________________________________________             [facility] - Preparing for Surgery Before surgery, you can play an important role.  Because skin is not sterile, your skin needs to be as free of germs as possible.  You can reduce the number of germs on your skin by washing with CHG (chlorahexidine gluconate) soap before surgery.  CHG is an antiseptic cleaner which kills germs and bonds with the skin to continue killing germs even after washing. Please DO NOT use if you have an allergy to CHG or antibacterial soaps.  If your skin becomes reddened/irritated stop using the CHG and inform your nurse when you arrive at Short Stay. Do not shave (including legs and underarms) for at least 48 hours prior to the first CHG shower.   You may shave your face/neck. Please follow these instructions carefully:  1.  Shower with CHG Soap the night before surgery and the  morning of Surgery.  2.  If you choose to wash your hair, wash your hair first as usual with your  normal  shampoo.  3.  After you shampoo, rinse your hair and body thoroughly to remove the  shampoo.                           4.  Use CHG as you would any other liquid soap.  You can apply chg directly  to the skin and wash                       Gently with a scrungie or clean washcloth.  5.  Apply  the CHG Soap to your body ONLY FROM THE NECK DOWN.   Do not use on face/ open                           Wound or open sores. Avoid contact with eyes, ears mouth and genitals (private parts).                       Wash face,  Genitals (private parts) with your normal soap.             6.  Wash thoroughly, paying special attention to the area where your surgery  will be performed.  7.  Thoroughly rinse your body with warm water from the neck down.  8.  DO NOT shower/wash with your normal soap after using and rinsing off  the CHG Soap.                9.  Pat yourself dry with a clean towel.            10.  Wear clean pajamas.            11.  Place clean sheets on your bed the night of your first shower and do not  sleep with pets. Day of Surgery : Do not apply any lotions/deodorants the morning of surgery.  Please wear clean clothes to the hospital/surgery center.  FAILURE TO FOLLOW THESE INSTRUCTIONS MAY RESULT IN THE CANCELLATION OF YOUR SURGERY PATIENT SIGNATURE_________________________________  NURSE SIGNATURE__________________________________  ________________________________________________________________________  WHAT IS A BLOOD TRANSFUSION? Blood Transfusion Information  A transfusion is the replacement of blood or some of its parts. Blood is made up of multiple cells which provide different functions.  Red blood cells carry oxygen and are used for blood  loss replacement.  White blood cells fight against infection.  Platelets control bleeding.  Plasma helps clot blood.  Other blood products are available for specialized needs, such as hemophilia or other clotting disorders. BEFORE THE TRANSFUSION  Who gives blood for transfusions?   Healthy volunteers who are fully evaluated to make sure their blood is safe. This is blood bank blood. Transfusion therapy is the safest it has ever been in the practice of medicine. Before blood is taken from a donor, a complete history is taken to make sure that person has no history of diseases nor engages in risky social behavior (examples are intravenous drug use or sexual activity with multiple partners). The donor's travel history is screened to minimize risk of transmitting infections, such as malaria. The donated blood is tested for signs of infectious diseases, such as HIV and hepatitis. The blood is then tested to be sure it is compatible with you in order to minimize the chance of a transfusion reaction. If you or a relative donates blood, this is often done in anticipation of surgery and is not appropriate for emergency situations. It takes many days to process the donated blood. RISKS AND COMPLICATIONS Although transfusion therapy is very safe and saves many lives, the main dangers of transfusion include:   Getting an infectious disease.  Developing a transfusion reaction. This is an allergic reaction to something in the blood you were given. Every precaution is taken to prevent this. The decision to have a blood transfusion has been considered carefully by your caregiver before blood is given. Blood is not given unless the benefits outweigh the risks. AFTER THE TRANSFUSION  Right after receiving a blood  transfusion, you will usually feel much better and more energetic. This is especially true if your red blood cells have gotten low (anemic). The transfusion raises the level of the red blood cells  which carry oxygen, and this usually causes an energy increase.  The nurse administering the transfusion will monitor you carefully for complications. HOME CARE INSTRUCTIONS  No special instructions are needed after a transfusion. You may find your energy is better. Speak with your caregiver about any limitations on activity for underlying diseases you may have. SEEK MEDICAL CARE IF:   Your condition is not improving after your transfusion.  You develop redness or irritation at the intravenous (IV) site. SEEK IMMEDIATE MEDICAL CARE IF:  Any of the following symptoms occur over the next 12 hours:  Shaking chills.  You have a temperature by mouth above 102 F (38.9 C), not controlled by medicine.  Chest, back, or muscle pain.  People around you feel you are not acting correctly or are confused.  Shortness of breath or difficulty breathing.  Dizziness and fainting.  You get a rash or develop hives.  You have a decrease in urine output.  Your urine turns a dark color or changes to pink, red, or brown. Any of the following symptoms occur over the next 10 days:  You have a temperature by mouth above 102 F (38.9 C), not controlled by medicine.  Shortness of breath.  Weakness after normal activity.  The white part of the eye turns yellow (jaundice).  You have a decrease in the amount of urine or are urinating less often.  Your urine turns a dark color or changes to pink, red, or brown. Document Released: 11/30/2000 Document Revised: 02/25/2012 Document Reviewed: 07/19/2008 ExitCare Patient Information 2014 [place].  _______________________________________________________________________  Incentive Spirometer  An incentive spirometer is a tool that can help keep your lungs clear and active. This tool measures how well you are filling your lungs with each breath. Taking long deep breaths may help reverse or decrease the chance of developing breathing (pulmonary)  problems (especially infection) following:  A long period of time when you are unable to move or be active. BEFORE THE PROCEDURE   If the spirometer includes an indicator to show your best effort, your nurse or respiratory therapist will set it to a desired goal.  If possible, sit up straight or lean slightly forward. Try not to slouch.  Hold the incentive spirometer in an upright position. INSTRUCTIONS FOR USE  1. Sit on the edge of your bed if possible, or sit up as far as you can in bed or on a chair. 2. Hold the incentive spirometer in an upright position. 3. Breathe out normally. 4. Place the mouthpiece in your mouth and seal your lips tightly around it. 5. Breathe in slowly and as deeply as possible, raising the piston or the ball toward the top of the column. 6. Hold your breath for 3-5 seconds or for as long as possible. Allow the piston or ball to fall to the bottom of the column. 7. Remove the mouthpiece from your mouth and breathe out normally. 8. Rest for a few seconds and repeat Steps 1 through 7 at least 10 times every 1-2 hours when you are awake. Take your time and take a few normal breaths between deep breaths. 9. The spirometer may include an indicator to show your best effort. Use the indicator as a goal to work toward during each repetition. 10. After each set of 10 deep  breaths, practice coughing to be sure your lungs are clear. If you have an incision (the cut made at the time of surgery), support your incision when coughing by placing a pillow or rolled up towels firmly against it. Once you are able to get out of bed, walk around indoors and cough well. You may stop using the incentive spirometer when instructed by your caregiver.  RISKS AND COMPLICATIONS  Take your time so you do not get dizzy or light-headed.  If you are in pain, you may need to take or ask for pain medication before doing incentive spirometry. It is harder to take a deep breath if you are having  pain. AFTER USE  Rest and breathe slowly and easily.  It can be helpful to keep track of a log of your progress. Your caregiver can provide you with a simple table to help with this. If you are using the spirometer at home, follow these instructions: [place] IF:   You are having difficultly using the spirometer.  You have trouble using the spirometer as often as instructed.  Your pain medication is not giving enough relief while using the spirometer.  You develop fever of 100.5 F (38.1 C) or higher. SEEK IMMEDIATE MEDICAL CARE IF:   You cough up bloody sputum that had not been present before.  You develop fever of 102 F (38.9 C) or greater.  You develop worsening pain at or near the incision site. MAKE SURE YOU:   Understand these instructions.  Will watch your condition.  Will get help right away if you are not doing well or get worse. Document Released: 04/15/2007 Document Revised: 02/25/2012 Document Reviewed: 06/16/2007 [facility] Patient Information 2014 [place], [place].   ________________________________________________________________________

## 2017-11-04 NOTE — Progress Notes (Signed)
EKG-05/20/17-epic  Clearance in telephone note 10/14/17 and on chart  DR [place]- LOV-note-epic -05/20/17

## 2017-11-11 ENCOUNTER — Encounter (HOSPITAL_COMMUNITY): Payer: Self-pay | Admitting: *Deleted

## 2017-11-11 ENCOUNTER — Inpatient Hospital Stay (HOSPITAL_COMMUNITY)
Admission: RE | Admit: 2017-11-11 | Discharge: 2017-11-14 | DRG: 470 | Disposition: A | Discharge status: Home / Self Care | Payer: Medicare Other | Source: Ambulatory Visit | Attending: Orthopedic Surgery | Admitting: Orthopedic Surgery

## 2017-11-11 ENCOUNTER — Inpatient Hospital Stay (HOSPITAL_COMMUNITY): Payer: Medicare Other | Admitting: Anesthesiology

## 2017-11-11 ENCOUNTER — Other Ambulatory Visit: Payer: Self-pay

## 2017-11-11 ENCOUNTER — Encounter (HOSPITAL_COMMUNITY)
Admission: RE | Disposition: A | Discharge status: Home / Self Care | Payer: Self-pay | Source: Ambulatory Visit | Attending: Orthopedic Surgery

## 2017-11-11 ENCOUNTER — Encounter (HOSPITAL_COMMUNITY): Payer: Self-pay | Admitting: Anesthesiology

## 2017-11-11 DIAGNOSIS — J45909 Unspecified asthma, uncomplicated: Secondary | ICD-10-CM | POA: Diagnosis not present

## 2017-11-11 DIAGNOSIS — Z96652 Presence of left artificial knee joint: Secondary | ICD-10-CM | POA: Diagnosis not present

## 2017-11-11 DIAGNOSIS — E7800 Pure hypercholesterolemia, unspecified: Secondary | ICD-10-CM | POA: Diagnosis present

## 2017-11-11 DIAGNOSIS — M1710 Unilateral primary osteoarthritis, unspecified knee: Secondary | ICD-10-CM

## 2017-11-11 DIAGNOSIS — M1711 Unilateral primary osteoarthritis, right knee: Principal | ICD-10-CM | POA: Diagnosis present

## 2017-11-11 DIAGNOSIS — M179 Osteoarthritis of knee, unspecified: Secondary | ICD-10-CM | POA: Diagnosis present

## 2017-11-11 DIAGNOSIS — N189 Chronic kidney disease, unspecified: Secondary | ICD-10-CM | POA: Diagnosis not present

## 2017-11-11 DIAGNOSIS — K219 Gastro-esophageal reflux disease without esophagitis: Secondary | ICD-10-CM | POA: Diagnosis not present

## 2017-11-11 DIAGNOSIS — R112 Nausea with vomiting, unspecified: Secondary | ICD-10-CM | POA: Diagnosis not present

## 2017-11-11 DIAGNOSIS — Z7982 Long term (current) use of aspirin: Secondary | ICD-10-CM

## 2017-11-11 DIAGNOSIS — Z79899 Other long term (current) drug therapy: Secondary | ICD-10-CM

## 2017-11-11 DIAGNOSIS — M25561 Pain in right knee: Secondary | ICD-10-CM | POA: Diagnosis not present

## 2017-11-11 DIAGNOSIS — G8918 Other acute postprocedural pain: Secondary | ICD-10-CM | POA: Diagnosis not present

## 2017-11-11 DIAGNOSIS — R0600 Dyspnea, unspecified: Secondary | ICD-10-CM | POA: Diagnosis not present

## 2017-11-11 HISTORY — PX: TOTAL KNEE ARTHROPLASTY: SHX125

## 2017-11-11 LAB — TYPE AND SCREEN
ABO/RH(D): A POS
ANTIBODY SCREEN: NEGATIVE

## 2017-11-11 SURGERY — ARTHROPLASTY, KNEE, TOTAL
Anesthesia: Regional | Site: Knee | Laterality: Right

## 2017-11-11 MED ORDER — STERILE WATER FOR IRRIGATION IR SOLN
Status: DC | PRN
  Administered 2017-11-11: 2000 mL

## 2017-11-11 MED ORDER — LIDOCAINE 2% (20 MG/ML) 5 ML SYRINGE
INTRAMUSCULAR | Status: AC
  Filled 2017-11-11: qty 5

## 2017-11-11 MED ORDER — PROPOFOL 10 MG/ML IV BOLUS
INTRAVENOUS | Status: DC | PRN
  Administered 2017-11-11: 30 mg via INTRAVENOUS
  Administered 2017-11-11: 90 mg via INTRAVENOUS
  Administered 2017-11-11: 20 mg via INTRAVENOUS

## 2017-11-11 MED ORDER — BISACODYL 10 MG RE SUPP: 10.0000 mg | Freq: Every day | RECTAL | Status: DC | PRN

## 2017-11-11 MED ORDER — ROCURONIUM BROMIDE 100 MG/10ML IV SOLN
INTRAVENOUS | Status: DC | PRN
  Administered 2017-11-11: 50 mg via INTRAVENOUS

## 2017-11-11 MED ORDER — DIPHENHYDRAMINE HCL 12.5 MG/5ML PO ELIX: 12.5000 mg | ORAL_SOLUTION | ORAL | Status: DC | PRN

## 2017-11-11 MED ORDER — MIDAZOLAM HCL 2 MG/2ML IJ SOLN: 1.0000 mg | INTRAMUSCULAR | Status: DC | PRN

## 2017-11-11 MED ORDER — PROPOFOL 10 MG/ML IV BOLUS
INTRAVENOUS | Status: AC
  Filled 2017-11-11: qty 60

## 2017-11-11 MED ORDER — SODIUM CHLORIDE 0.9 % IJ SOLN
INTRAMUSCULAR | Status: DC | PRN
  Administered 2017-11-11: 60 mL

## 2017-11-11 MED ORDER — SODIUM CHLORIDE 0.9 % IJ SOLN
INTRAMUSCULAR | Status: AC
  Filled 2017-11-11: qty 50

## 2017-11-11 MED ORDER — SODIUM CHLORIDE 0.9 % IR SOLN
Status: DC | PRN
  Administered 2017-11-11: 1000 mL

## 2017-11-11 MED ORDER — OXYCODONE HCL 5 MG/5ML PO SOLN: 5.0000 mg | Freq: Once | ORAL | Status: DC | PRN

## 2017-11-11 MED ORDER — DOCUSATE SODIUM 100 MG PO CAPS
100.0000 mg | ORAL_CAPSULE | Freq: Two times a day (BID) | ORAL | Status: DC
  Administered 2017-11-11 – 2017-11-14 (×6): 100 mg via ORAL
  Filled 2017-11-11 (×6): qty 1

## 2017-11-11 MED ORDER — ONDANSETRON HCL 4 MG/2ML IJ SOLN: 4.0000 mg | Freq: Four times a day (QID) | INTRAMUSCULAR | Status: DC | PRN

## 2017-11-11 MED ORDER — BUPIVACAINE HCL (PF) 0.25 % IJ SOLN
INTRAMUSCULAR | Status: AC
  Filled 2017-11-11: qty 30

## 2017-11-11 MED ORDER — FENTANYL CITRATE (PF) 100 MCG/2ML IJ SOLN: 50.0000 ug | INTRAMUSCULAR | Status: DC | PRN

## 2017-11-11 MED ORDER — GABAPENTIN 300 MG PO CAPS
ORAL_CAPSULE | ORAL | Status: AC
  Administered 2017-11-11: 300 mg via ORAL
  Filled 2017-11-11: qty 1

## 2017-11-11 MED ORDER — ACETAMINOPHEN 325 MG PO TABS
650.0000 mg | ORAL_TABLET | ORAL | Status: DC | PRN
  Administered 2017-11-13 – 2017-11-14 (×3): 650 mg via ORAL
  Filled 2017-11-11 (×4): qty 2

## 2017-11-11 MED ORDER — SUGAMMADEX SODIUM 200 MG/2ML IV SOLN
INTRAVENOUS | Status: DC | PRN
  Administered 2017-11-11: 200 mg via INTRAVENOUS

## 2017-11-11 MED ORDER — SUGAMMADEX SODIUM 200 MG/2ML IV SOLN
INTRAVENOUS | Status: AC
  Filled 2017-11-11: qty 2

## 2017-11-11 MED ORDER — MORPHINE SULFATE (PF) 4 MG/ML IV SOLN
1.0000 mg | INTRAVENOUS | Status: DC | PRN
  Administered 2017-11-11: 1 mg via INTRAVENOUS
  Filled 2017-11-11: qty 1

## 2017-11-11 MED ORDER — ONDANSETRON HCL 4 MG/2ML IJ SOLN
4.0000 mg | Freq: Four times a day (QID) | INTRAMUSCULAR | Status: DC | PRN
  Administered 2017-11-11 – 2017-11-13 (×2): 4 mg via INTRAVENOUS
  Filled 2017-11-11 (×2): qty 2

## 2017-11-11 MED ORDER — ROSUVASTATIN CALCIUM 5 MG PO TABS
5.0000 mg | ORAL_TABLET | Freq: Every day | ORAL | Status: DC
  Administered 2017-11-11 – 2017-11-13 (×3): 5 mg via ORAL
  Filled 2017-11-11 (×3): qty 1

## 2017-11-11 MED ORDER — PHENOL 1.4 % MT LIQD: 1.0000 | OROMUCOSAL | Status: DC | PRN

## 2017-11-11 MED ORDER — LIDOCAINE 2% (20 MG/ML) 5 ML SYRINGE
INTRAMUSCULAR | Status: DC | PRN
  Administered 2017-11-11: 60 mg via INTRAVENOUS

## 2017-11-11 MED ORDER — TRIAMCINOLONE ACETONIDE 55 MCG/ACT NA AERO
2.0000 | INHALATION_SPRAY | Freq: Every day | NASAL | Status: DC
  Administered 2017-11-11 – 2017-11-13 (×3): 2 via NASAL
  Filled 2017-11-11: qty 21.6

## 2017-11-11 MED ORDER — METHOCARBAMOL 1000 MG/10ML IJ SOLN
500.0000 mg | Freq: Four times a day (QID) | INTRAMUSCULAR | Status: DC | PRN
  Administered 2017-11-11: 500 mg via INTRAVENOUS
  Filled 2017-11-11: qty 550

## 2017-11-11 MED ORDER — ROPIVACAINE HCL 7.5 MG/ML IJ SOLN
INTRAMUSCULAR | Status: DC | PRN
  Administered 2017-11-11: 20 mL via PERINEURAL

## 2017-11-11 MED ORDER — MIDAZOLAM HCL 2 MG/2ML IJ SOLN
INTRAMUSCULAR | Status: AC
  Administered 2017-11-11: 1 mg via INTRAVENOUS
  Filled 2017-11-11: qty 2

## 2017-11-11 MED ORDER — ACETAMINOPHEN 500 MG PO TABS
1000.0000 mg | ORAL_TABLET | Freq: Four times a day (QID) | ORAL | Status: AC
  Administered 2017-11-11 – 2017-11-12 (×3): 1000 mg via ORAL
  Filled 2017-11-11 (×4): qty 2

## 2017-11-11 MED ORDER — LACTATED RINGERS IV SOLN
INTRAVENOUS | Status: DC
  Administered 2017-11-11 (×2): via INTRAVENOUS

## 2017-11-11 MED ORDER — FENTANYL CITRATE (PF) 100 MCG/2ML IJ SOLN
INTRAMUSCULAR | Status: AC
  Administered 2017-11-11: 50 ug via INTRAVENOUS
  Filled 2017-11-11: qty 2

## 2017-11-11 MED ORDER — GABAPENTIN 300 MG PO CAPS
300.0000 mg | ORAL_CAPSULE | Freq: Once | ORAL | Status: AC
Start: 2017-11-11 — End: 2017-11-11
  Administered 2017-11-11: 300 mg via ORAL

## 2017-11-11 MED ORDER — HYDROMORPHONE HCL 1 MG/ML IJ SOLN
INTRAMUSCULAR | Status: AC
  Administered 2017-11-11: 0.5 mg via INTRAVENOUS
  Filled 2017-11-11: qty 2

## 2017-11-11 MED ORDER — FENTANYL CITRATE (PF) 100 MCG/2ML IJ SOLN
INTRAMUSCULAR | Status: AC
  Filled 2017-11-11: qty 2

## 2017-11-11 MED ORDER — DEXAMETHASONE SODIUM PHOSPHATE 10 MG/ML IJ SOLN
INTRAMUSCULAR | Status: AC
  Filled 2017-11-11: qty 1

## 2017-11-11 MED ORDER — MENTHOL 3 MG MT LOZG: 1.0000 | LOZENGE | OROMUCOSAL | Status: DC | PRN

## 2017-11-11 MED ORDER — ACETAMINOPHEN 650 MG RE SUPP
650.0000 mg | RECTAL | Status: DC | PRN
  Filled 2017-11-11: qty 1

## 2017-11-11 MED ORDER — ONDANSETRON HCL 4 MG PO TABS: 4.0000 mg | ORAL_TABLET | Freq: Four times a day (QID) | ORAL | Status: DC | PRN

## 2017-11-11 MED ORDER — ONDANSETRON HCL 4 MG/2ML IJ SOLN
INTRAMUSCULAR | Status: DC | PRN
  Administered 2017-11-11: 4 mg via INTRAVENOUS

## 2017-11-11 MED ORDER — DEXAMETHASONE SODIUM PHOSPHATE 10 MG/ML IJ SOLN
10.0000 mg | Freq: Once | INTRAMUSCULAR | Status: AC
  Administered 2017-11-11: 10 mg via INTRAVENOUS

## 2017-11-11 MED ORDER — HYDROMORPHONE HCL 1 MG/ML IJ SOLN
0.2500 mg | INTRAMUSCULAR | Status: DC | PRN
  Administered 2017-11-11 (×4): 0.5 mg via INTRAVENOUS

## 2017-11-11 MED ORDER — SODIUM CHLORIDE 0.9 % IJ SOLN
INTRAMUSCULAR | Status: AC
  Filled 2017-11-11: qty 10

## 2017-11-11 MED ORDER — FENTANYL CITRATE (PF) 100 MCG/2ML IJ SOLN
100.0000 ug | Freq: Once | INTRAMUSCULAR | Status: AC
  Administered 2017-11-11: 50 ug via INTRAVENOUS

## 2017-11-11 MED ORDER — ONDANSETRON HCL 4 MG/2ML IJ SOLN
INTRAMUSCULAR | Status: AC
  Filled 2017-11-11: qty 2

## 2017-11-11 MED ORDER — ACETAMINOPHEN 10 MG/ML IV SOLN
INTRAVENOUS | Status: AC
  Filled 2017-11-11: qty 100

## 2017-11-11 MED ORDER — METHOCARBAMOL 500 MG PO TABS
500.0000 mg | ORAL_TABLET | Freq: Four times a day (QID) | ORAL | Status: DC | PRN
  Administered 2017-11-11 – 2017-11-14 (×9): 500 mg via ORAL
  Filled 2017-11-11 (×10): qty 1

## 2017-11-11 MED ORDER — METOCLOPRAMIDE HCL 5 MG PO TABS: 5.0000 mg | ORAL_TABLET | Freq: Three times a day (TID) | ORAL | Status: DC | PRN

## 2017-11-11 MED ORDER — LORATADINE 10 MG PO TABS
10.0000 mg | ORAL_TABLET | Freq: Every day | ORAL | Status: DC
  Administered 2017-11-12: 10 mg via ORAL
  Filled 2017-11-11 (×2): qty 1

## 2017-11-11 MED ORDER — OXYCODONE HCL 5 MG PO TABS
10.0000 mg | ORAL_TABLET | ORAL | Status: DC | PRN
  Administered 2017-11-11 – 2017-11-13 (×12): 10 mg via ORAL
  Filled 2017-11-11 (×12): qty 2

## 2017-11-11 MED ORDER — ROCURONIUM BROMIDE 50 MG/5ML IV SOSY
PREFILLED_SYRINGE | INTRAVENOUS | Status: AC
  Filled 2017-11-11: qty 5

## 2017-11-11 MED ORDER — VANCOMYCIN HCL IN DEXTROSE 1-5 GM/200ML-% IV SOLN
1000.0000 mg | Freq: Two times a day (BID) | INTRAVENOUS | Status: AC
  Administered 2017-11-11: 1000 mg via INTRAVENOUS
  Filled 2017-11-11: qty 200

## 2017-11-11 MED ORDER — VANCOMYCIN HCL IN DEXTROSE 1-5 GM/200ML-% IV SOLN
INTRAVENOUS | Status: AC
  Administered 2017-11-11: 1000 mg via INTRAVENOUS
  Filled 2017-11-11: qty 200

## 2017-11-11 MED ORDER — FLEET ENEMA 7-19 GM/118ML RE ENEM
1.0000 | ENEMA | Freq: Once | RECTAL | Status: DC | PRN
Start: 2017-11-11 — End: 2017-11-14

## 2017-11-11 MED ORDER — VANCOMYCIN HCL IN DEXTROSE 1-5 GM/200ML-% IV SOLN
1000.0000 mg | INTRAVENOUS | Status: AC
  Administered 2017-11-11: 1000 mg via INTRAVENOUS

## 2017-11-11 MED ORDER — CHLORHEXIDINE GLUCONATE 4 % EX LIQD: 60.0000 mL | Freq: Once | CUTANEOUS | Status: DC

## 2017-11-11 MED ORDER — MIDAZOLAM HCL 2 MG/2ML IJ SOLN
2.0000 mg | Freq: Once | INTRAMUSCULAR | Status: AC
  Administered 2017-11-11: 1 mg via INTRAVENOUS

## 2017-11-11 MED ORDER — DEXAMETHASONE SODIUM PHOSPHATE 10 MG/ML IJ SOLN
10.0000 mg | Freq: Once | INTRAMUSCULAR | Status: AC
  Administered 2017-11-12: 10 mg via INTRAVENOUS
  Filled 2017-11-11: qty 1

## 2017-11-11 MED ORDER — OXYCODONE HCL 5 MG PO TABS: 5.0000 mg | ORAL_TABLET | Freq: Once | ORAL | Status: DC | PRN

## 2017-11-11 MED ORDER — POLYETHYLENE GLYCOL 3350 17 G PO PACK: 17.0000 g | PACK | Freq: Every day | ORAL | Status: DC | PRN

## 2017-11-11 MED ORDER — TRANEXAMIC ACID 1000 MG/10ML IV SOLN
1000.0000 mg | INTRAVENOUS | Status: AC
  Administered 2017-11-11: 1000 mg via INTRAVENOUS
  Filled 2017-11-11: qty 1100

## 2017-11-11 MED ORDER — TRANEXAMIC ACID 1000 MG/10ML IV SOLN
1000.0000 mg | Freq: Once | INTRAVENOUS | Status: AC
  Administered 2017-11-11: 1000 mg via INTRAVENOUS
  Filled 2017-11-11: qty 1100

## 2017-11-11 MED ORDER — SODIUM CHLORIDE 0.9 % IV SOLN
INTRAVENOUS | Status: DC
  Administered 2017-11-11: 13:00:00 via INTRAVENOUS

## 2017-11-11 MED ORDER — OXYCODONE HCL 5 MG PO TABS
5.0000 mg | ORAL_TABLET | ORAL | Status: DC | PRN
  Administered 2017-11-11: 5 mg via ORAL
  Filled 2017-11-11: qty 1

## 2017-11-11 MED ORDER — ACETAMINOPHEN 10 MG/ML IV SOLN
1000.0000 mg | Freq: Once | INTRAVENOUS | Status: AC
  Administered 2017-11-11: 1000 mg via INTRAVENOUS

## 2017-11-11 MED ORDER — TRAMADOL HCL 50 MG PO TABS
50.0000 mg | ORAL_TABLET | Freq: Four times a day (QID) | ORAL | Status: DC | PRN
Start: 2017-11-11 — End: 2017-11-14
  Administered 2017-11-12: 50 mg via ORAL
  Filled 2017-11-11: qty 1

## 2017-11-11 MED ORDER — BUPIVACAINE LIPOSOME 1.3 % IJ SUSP
INTRAMUSCULAR | Status: DC | PRN
  Administered 2017-11-11: 20 mL

## 2017-11-11 MED ORDER — RIVAROXABAN 10 MG PO TABS
10.0000 mg | ORAL_TABLET | Freq: Every day | ORAL | Status: DC
  Administered 2017-11-12 – 2017-11-14 (×3): 10 mg via ORAL
  Filled 2017-11-11 (×3): qty 1

## 2017-11-11 MED ORDER — TRIAMTERENE-HCTZ 75-50 MG PO TABS
1.0000 | ORAL_TABLET | Freq: Every morning | ORAL | Status: DC
  Filled 2017-11-11 (×3): qty 1

## 2017-11-11 MED ORDER — FENTANYL CITRATE (PF) 100 MCG/2ML IJ SOLN
INTRAMUSCULAR | Status: DC | PRN
  Administered 2017-11-11 (×5): 50 ug via INTRAVENOUS

## 2017-11-11 MED ORDER — BUPIVACAINE LIPOSOME 1.3 % IJ SUSP
20.0000 mL | Freq: Once | INTRAMUSCULAR | Status: DC
  Filled 2017-11-11: qty 20

## 2017-11-11 MED ORDER — 0.9 % SODIUM CHLORIDE (POUR BTL) OPTIME
TOPICAL | Status: DC | PRN
  Administered 2017-11-11: 1000 mL

## 2017-11-11 MED ORDER — PANTOPRAZOLE SODIUM 40 MG PO TBEC
40.0000 mg | DELAYED_RELEASE_TABLET | Freq: Every day | ORAL | Status: DC
  Administered 2017-11-12 – 2017-11-14 (×3): 40 mg via ORAL
  Filled 2017-11-11 (×3): qty 1

## 2017-11-11 MED ORDER — METOCLOPRAMIDE HCL 5 MG/ML IJ SOLN
5.0000 mg | Freq: Three times a day (TID) | INTRAMUSCULAR | Status: DC | PRN
  Administered 2017-11-11: 10 mg via INTRAVENOUS
  Filled 2017-11-11: qty 2

## 2017-11-11 SURGICAL SUPPLY — 50 items
BAG DECANTER FOR FLEXI CONT (MISCELLANEOUS) IMPLANT
BAG ZIPLOCK 12X15 (MISCELLANEOUS) ×2 IMPLANT
BANDAGE ACE 6X5 VEL STRL LF (GAUZE/BANDAGES/DRESSINGS) ×2 IMPLANT
BLADE SAG 18X100X1.27 (BLADE) ×2 IMPLANT
BLADE SAW SGTL 11.0X1.19X90.0M (BLADE) ×2 IMPLANT
BOWL SMART MIX CTS (DISPOSABLE) ×2 IMPLANT
CAPT KNEE TOTAL 3 ATTUNE ×2 IMPLANT
CEMENT HV SMART SET (Cement) ×4 IMPLANT
COVER SURGICAL LIGHT HANDLE (MISCELLANEOUS) ×2 IMPLANT
CUFF TOURN SGL QUICK 34 (TOURNIQUET CUFF) ×1
CUFF TRNQT CYL 34X4X40X1 (TOURNIQUET CUFF) ×1 IMPLANT
DECANTER SPIKE VIAL GLASS SM (MISCELLANEOUS) ×2 IMPLANT
DRAPE U-SHAPE 47X51 STRL (DRAPES) ×2 IMPLANT
DRSG ADAPTIC 3X8 NADH LF (GAUZE/BANDAGES/DRESSINGS) ×2 IMPLANT
DRSG PAD ABDOMINAL 8X10 ST (GAUZE/BANDAGES/DRESSINGS) ×2 IMPLANT
DURAPREP 26ML APPLICATOR (WOUND CARE) ×2 IMPLANT
ELECT REM PT RETURN 15FT ADLT (MISCELLANEOUS) ×2 IMPLANT
EVACUATOR 1/8 PVC DRAIN (DRAIN) ×2 IMPLANT
GAUZE SPONGE 4X4 12PLY STRL (GAUZE/BANDAGES/DRESSINGS) ×2 IMPLANT
GLOVE BIO SURGEON STRL SZ7.5 (GLOVE) ×2 IMPLANT
GLOVE BIO SURGEON STRL SZ8 (GLOVE) ×2 IMPLANT
GLOVE BIOGEL PI IND STRL 6.5 (GLOVE) IMPLANT
GLOVE BIOGEL PI IND STRL 8 (GLOVE) ×1 IMPLANT
GLOVE BIOGEL PI INDICATOR 6.5 (GLOVE)
GLOVE BIOGEL PI INDICATOR 8 (GLOVE) ×1
GLOVE SURG SS PI 6.5 STRL IVOR (GLOVE) IMPLANT
GOWN STRL REUS W/TWL LRG LVL3 (GOWN DISPOSABLE) ×2 IMPLANT
GOWN STRL REUS W/TWL XL LVL3 (GOWN DISPOSABLE) ×2 IMPLANT
HANDPIECE INTERPULSE COAX TIP (DISPOSABLE) ×1
IMMOBILIZER KNEE 20 (SOFTGOODS) ×2
IMMOBILIZER KNEE 20 THIGH 36 (SOFTGOODS) ×1 IMPLANT
MANIFOLD NEPTUNE II (INSTRUMENTS) ×2 IMPLANT
NS IRRIG 1000ML POUR BTL (IV SOLUTION) ×2 IMPLANT
PACK TOTAL KNEE CUSTOM (KITS) ×2 IMPLANT
PADDING CAST COTTON 6X4 STRL (CAST SUPPLIES) ×4 IMPLANT
POSITIONER SURGICAL ARM (MISCELLANEOUS) ×2 IMPLANT
SET HNDPC FAN SPRY TIP SCT (DISPOSABLE) ×1 IMPLANT
SLEEVE SURGEON STRL (DRAPES) ×2 IMPLANT
STRIP CLOSURE SKIN 1/2X4 (GAUZE/BANDAGES/DRESSINGS) ×4 IMPLANT
SUT MNCRL AB 4-0 PS2 18 (SUTURE) ×4 IMPLANT
SUT STRATAFIX 0 PDS 27 VIOLET (SUTURE) ×2
SUT VIC AB 2-0 CT1 27 (SUTURE) ×3
SUT VIC AB 2-0 CT1 TAPERPNT 27 (SUTURE) ×3 IMPLANT
SUTURE STRATFX 0 PDS 27 VIOLET (SUTURE) ×1 IMPLANT
SYR 30ML LL (SYRINGE) ×4 IMPLANT
TRAY FOLEY CATH 14FRSI W/METER (CATHETERS) ×2 IMPLANT
TRAY FOLEY W/METER SILVER 16FR (SET/KITS/TRAYS/PACK) IMPLANT
WATER STERILE IRR 1000ML POUR (IV SOLUTION) ×4 IMPLANT
WRAP KNEE MAXI GEL POST OP (GAUZE/BANDAGES/DRESSINGS) ×2 IMPLANT
YANKAUER SUCT BULB TIP 10FT TU (MISCELLANEOUS) ×2 IMPLANT

## 2017-11-11 NOTE — Op Note (Signed)
OPERATIVE REPORT-TOTAL KNEE ARTHROPLASTY   Pre-operative diagnosis- Osteoarthritis  Right knee(s)  Post-operative diagnosis- Osteoarthritis Right knee(s)  Procedure-  Right  Total Knee Arthroplasty (Depuy Attune)  Surgeon- Dione Plover. Breylin Dom, MD  Assistant- [name], PA-C   Anesthesia-  GA combined with regional for post-op pain  EBL- 50 ml  Drains Hemovac  Tourniquet time-  Total Tourniquet Time Documented: Thigh (Left) - 30 minutes Total: Thigh (Left) - 30 minutes     Complications- None  Condition-PACU - hemodynamically stable.   Brief Clinical Note  Diane Fox is a 66 y.o. year old female with end stage OA of her right knee with progressively worsening pain and dysfunction. She has constant pain, with activity and at rest and significant functional deficits with difficulties even with ADLs. She has had extensive non-op management including analgesics, injections of cortisone and viscosupplements, and home exercise program, but remains in significant pain with significant dysfunction.Radiographs show bone on bone arthritis patellofemoral. She presents now for right Total Knee Arthroplasty.    Procedure in detail---   The patient is brought into the operating room and positioned supine on the operating table. After successful administration of  GA combined with regional for post-op pain,   a tourniquet is placed high on the  Right thigh(s) and the lower extremity is prepped and draped in the usual sterile fashion. Time out is performed by the operating team and then the  Right lower extremity is wrapped in Esmarch, knee flexed and the tourniquet inflated to 300 mmHg.       A midline incision is made with a ten blade through the subcutaneous tissue to the level of the extensor mechanism. A fresh blade is used to make a medial parapatellar arthrotomy. Soft tissue over the proximal medial tibia is subperiosteally elevated to the joint line with a knife and into the  semimembranosus bursa with a Cobb elevator. Soft tissue over the proximal lateral tibia is elevated with attention being paid to avoiding the patellar tendon on the tibial tubercle. The patella is everted, knee flexed 90 degrees and the ACL and PCL are removed. Findings are bone on bone patellofemoral with global osteophytes.        The drill is used to create a starting hole in the distal femur and the canal is thoroughly irrigated with sterile saline to remove the fatty contents. The 5 degree Right  valgus alignment guide is placed into the femoral canal and the distal femoral cutting block is pinned to remove 9 mm off the distal femur. Resection is made with an oscillating saw.      The tibia is subluxed forward and the menisci are removed. The extramedullary alignment guide is placed referencing proximally at the medial aspect of the tibial tubercle and distally along the second metatarsal axis and tibial crest. The block is pinned to remove 54mm off the more deficient medial  side. Resection is made with an oscillating saw. Size 4is the most appropriate size for the tibia and the proximal tibia is prepared with the modular drill and keel punch for that size.      The femoral sizing guide is placed and size 5 is most appropriate. Rotation is marked off the epicondylar axis and confirmed by creating a rectangular flexion gap at 90 degrees. The size 5 cutting block is pinned in this rotation and the anterior, posterior and chamfer cuts are made with the oscillating saw. The intercondylar block is then placed and that cut is made.  Trial size 4 tibial component, trial size 5 narrow posterior stabilized femur and a 6  mm posterior stabilized rotating platform insert trial is placed. Full extension is achieved with excellent varus/valgus and anterior/posterior balance throughout full range of motion. The patella is everted and thickness measured to be 21  mm. Free hand resection is taken to 12 mm, a 32  template is placed, lug holes are drilled, trial patella is placed, and it tracks normally. Osteophytes are removed off the posterior femur with the trial in place. All trials are removed and the cut bone surfaces prepared with pulsatile lavage. Cement is mixed and once ready for implantation, the size 4 tibial implant, size  5 narrow posterior stabilized femoral component, and the size 32 patella are cemented in place and the patella is held with the clamp. The trial insert is placed and the knee held in full extension. The Exparel (20 ml mixed with 60 ml saline) is injected into the extensor mechanism, posterior capsule, medial and lateral gutters and subcutaneous tissues.  All extruded cement is removed and once the cement is hard the permanent 6 mm posterior stabilized rotating platform insert is placed into the tibial tray.      The wound is copiously irrigated with saline solution and the extensor mechanism closed over a hemovac drain with #1 V-loc suture. The tourniquet is released for a total tourniquet time of 30  minutes. Flexion against gravity is 140 degrees and the patella tracks normally. Subcutaneous tissue is closed with 2.0 vicryl and subcuticular with running 4.0 Monocryl. The incision is cleaned and dried and steri-strips and a bulky sterile dressing are applied. The limb is placed into a knee immobilizer and the patient is awakened and transported to recovery in stable condition.      Please note that a surgical assistant was a medical necessity for this procedure in order to perform it in a safe and expeditious manner. Surgical assistant was necessary to retract the ligaments and vital neurovascular structures to prevent injury to them and also necessary for proper positioning of the limb to allow for anatomic placement of the prosthesis.   Dione Plover Kolina Kube, MD    11/11/2017, 9:20 AM

## 2017-11-11 NOTE — Discharge Instructions (Addendum)
°  ° °Dr. Frank Aluisio °Total Joint Specialist °[facility] °[address] °[place], [place] [address] °[phone] ° °TOTAL KNEE REPLACEMENT POSTOPERATIVE DIRECTIONS ° °Knee Rehabilitation, Guidelines Following Surgery  °Results after knee surgery are often greatly improved when you follow the exercise, range of motion and muscle strengthening exercises prescribed by your doctor. Safety measures are also important to protect the knee from further injury. Any time any of these exercises cause you to have increased pain or swelling in your knee joint, decrease the amount until you are comfortable again and slowly increase them. If you have problems or questions, call your caregiver or physical therapist for advice.  ° °HOME CARE INSTRUCTIONS  °Remove items at home which could result in a fall. This includes throw rugs or furniture in walking pathways.  °· ICE to the affected knee every three hours for 30 minutes at a time and then as needed for pain and swelling.  Continue to use ice on the knee for pain and swelling from surgery. You may notice swelling that will progress down to the foot and ankle.  This is normal after surgery.  Elevate the leg when you are not up walking on it.   °· Continue to use the breathing machine which will help keep your temperature down.  It is common for your temperature to cycle up and down following surgery, especially at night when you are not up moving around and exerting yourself.  The breathing machine keeps your lungs expanded and your temperature down. °· Do not place pillow under knee, focus on keeping the knee straight while resting ° °DIET °You may resume your previous home diet once your are discharged from the hospital. ° °DRESSING / WOUND CARE / SHOWERING °You may shower 3 days after surgery, but keep the wounds dry during showering.  You may use an occlusive plastic wrap (Press'n Seal for example), NO SOAKING/SUBMERGING IN THE BATHTUB.  If the  bandage gets wet, change with a clean dry gauze.  If the incision gets wet, pat the wound dry with a clean towel. °You may start showering once you are discharged home but do not submerge the incision under water. Just pat the incision dry and apply a dry gauze dressing on daily. °Change the surgical dressing daily and reapply a dry dressing each time. ° °ACTIVITY °Walk with your walker as instructed. °Use walker as long as suggested by your caregivers. °Avoid periods of inactivity such as sitting longer than an hour when not asleep. This helps prevent blood clots.  °You may resume a sexual relationship in one month or when given the OK by your doctor.  °You may return to work once you are cleared by your doctor.  °Do not drive a car for 6 weeks or until released by you surgeon.  °Do not drive while taking narcotics. ° °WEIGHT BEARING °Weight bearing as tolerated with assist device (walker, cane, etc) as directed, use it as long as suggested by your surgeon or therapist, typically at least 4-6 weeks. ° °POSTOPERATIVE CONSTIPATION PROTOCOL °Constipation - defined medically as fewer than three stools per week and severe constipation as less than one stool per week. ° °One of the most common issues patients have following surgery is constipation.  Even if you have a regular bowel pattern at home, your normal regimen is likely to be disrupted due to multiple reasons following surgery.  Combination of anesthesia, postoperative narcotics, change in appetite and fluid intake all can affect your   bowels.  In order to avoid complications following surgery, here are some recommendations in order to help you during your recovery period. ° °Colace (docusate) - Pick up an over-the-counter form of Colace or another stool softener and take twice a day as long as you are requiring postoperative pain medications.  Take with a full glass of water daily.  If you experience loose stools or diarrhea, hold the colace until you stool forms  back up.  If your symptoms do not get better within 1 week or if they get worse, check with your doctor. ° °Dulcolax (bisacodyl) - Pick up over-the-counter and take as directed by the product packaging as needed to assist with the movement of your bowels.  Take with a full glass of water.  Use this product as needed if not relieved by Colace only.  ° °MiraLax (polyethylene glycol) - Pick up over-the-counter to have on hand.  MiraLax is a solution that will increase the amount of water in your bowels to assist with bowel movements.  Take as directed and can mix with a glass of water, juice, soda, coffee, or tea.  Take if you go more than two days without a movement. °Do not use MiraLax more than once per day. Call your doctor if you are still constipated or irregular after using this medication for 7 days in a row. ° °If you continue to have problems with postoperative constipation, please contact the office for further assistance and recommendations.  If you experience "the worst abdominal pain ever" or develop nausea or vomiting, please contact the office immediatly for further recommendations for treatment. ° °ITCHING ° If you experience itching with your medications, try taking only a single pain pill, or even half a pain pill at a time.  You can also use Benadryl over the counter for itching or also to help with sleep.  ° °TED HOSE STOCKINGS °Wear the elastic stockings on both legs for three weeks following surgery during the day but you may remove then at night for sleeping. ° °MEDICATIONS °See your medication summary on the “After Visit Summary” that the nursing staff will review with you prior to discharge.  You may have some home medications which will be placed on hold until you complete the course of blood thinner medication.  It is important for you to complete the blood thinner medication as prescribed by your surgeon.  Continue your approved medications as instructed at time of  discharge. ° °PRECAUTIONS °If you experience chest pain or shortness of breath - call 911 immediately for transfer to the hospital emergency department.  °If you develop a fever greater that 101 F, purulent drainage from wound, increased redness or drainage from wound, foul odor from the wound/dressing, or calf pain - CONTACT YOUR SURGEON.   °                                                °FOLLOW-UP APPOINTMENTS °Make sure you keep all of your appointments after your operation with your surgeon and caregivers. You should call the office at the above phone number and make an appointment for approximately two weeks after the date of your surgery or on the date instructed by your surgeon outlined in the "After Visit Summary". ° ° °RANGE OF MOTION AND STRENGTHENING EXERCISES  °Rehabilitation of the knee is important following a knee   injury or an operation. After just a few days of immobilization, the muscles of the thigh which control the knee become weakened and shrink (atrophy). Knee exercises are designed to build up the tone and strength of the thigh muscles and to improve knee motion. Often times heat used for twenty to thirty minutes before working out will loosen up your tissues and help with improving the range of motion but do not use heat for the first two weeks following surgery. These exercises can be done on a training (exercise) mat, on the floor, on a table or on a bed. Use what ever works the best and is most comfortable for you Knee exercises include:  °Leg Lifts - While your knee is still immobilized in a splint or cast, you can do straight leg raises. Lift the leg to 60 degrees, hold for 3 sec, and slowly lower the leg. Repeat 10-20 times 2-3 times daily. Perform this exercise against resistance later as your knee gets better.  °Quad and Hamstring Sets - Tighten up the muscle on the front of the thigh (Quad) and hold for 5-10 sec. Repeat this 10-20 times hourly. Hamstring sets are done by pushing the  foot backward against an object and holding for 5-10 sec. Repeat as with quad sets.  °· Leg Slides: Lying on your back, slowly slide your foot toward your buttocks, bending your knee up off the floor (only go as far as is comfortable). Then slowly slide your foot back down until your leg is flat on the floor again. °· Angel Wings: Lying on your back spread your legs to the side as far apart as you can without causing discomfort.  °A rehabilitation program following serious knee injuries can speed recovery and prevent re-injury in the future due to weakened muscles. Contact your doctor or a physical therapist for more information on knee rehabilitation.  ° °IF YOU ARE TRANSFERRED TO A SKILLED REHAB FACILITY °If the patient is transferred to a skilled rehab facility following release from the hospital, a list of the current medications will be sent to the facility for the patient to continue.  When discharged from the skilled rehab facility, please have the facility set up the patient's Home Health Physical Therapy prior to being released. Also, the skilled facility will be responsible for providing the patient with their medications at time of release from the facility to include their pain medication, the muscle relaxants, and their blood thinner medication. If the patient is still at the rehab facility at time of the two week follow up appointment, the skilled rehab facility will also need to assist the patient in arranging follow up appointment in our office and any transportation needs. ° °MAKE SURE YOU:  °Understand these instructions.  °Get help right away if you are not doing well or get worse.  ° ° °Pick up stool softner and laxative for home use following surgery while on pain medications. °Do not submerge incision under water. °Please use good hand washing techniques while changing dressing each day. °May shower starting three days after surgery. °Please use a clean towel to pat the incision dry following  showers. °Continue to use ice for pain and swelling after surgery. °Do not use any lotions or creams on the incision until instructed by your surgeon. ° °Take Xarelto for two and a half more weeks following discharge from the hospital, then discontinue Xarelto. °Once the patient has completed the blood thinner regimen, then take a Baby 81 mg Aspirin daily   for three more weeks. ° ° °Information on my medicine - XARELTO® (Rivaroxaban) ° ° °Why was Xarelto® prescribed for you? °Xarelto® was prescribed for you to reduce the risk of blood clots forming after orthopedic surgery. The medical term for these abnormal blood clots is venous thromboembolism (VTE). ° °What do you need to know about xarelto® ? °Take your Xarelto® ONCE DAILY at the same time every day. °You may take it either with or without food. ° °If you have difficulty swallowing the tablet whole, you may crush it and mix in applesauce just prior to taking your dose. ° °Take Xarelto® exactly as prescribed by your doctor and DO NOT stop taking Xarelto® without talking to the doctor who prescribed the medication.  Stopping without other VTE prevention medication to take the place of Xarelto® may increase your risk of developing a clot. ° °After discharge, you should have regular check-up appointments with your healthcare provider that is prescribing your Xarelto®.   ° °What do you do if you miss a dose? °If you miss a dose, take it as soon as you remember on the same day then continue your regularly scheduled once daily regimen the next day. Do not take two doses of Xarelto® on the same day.  ° °Important Safety Information °A possible side effect of Xarelto® is bleeding. You should call your healthcare provider right away if you experience any of the following: °? Bleeding from an injury or your nose that does not stop. °? Unusual colored urine (red or dark brown) or unusual colored stools (red or black). °? Unusual bruising for unknown reasons. °? A serious  fall or if you hit your head (even if there is no bleeding). ° °Some medicines may interact with Xarelto® and might increase your risk of bleeding while on Xarelto®. To help avoid this, consult your healthcare provider or pharmacist prior to using any new prescription or non-prescription medications, including herbals, vitamins, non-steroidal anti-inflammatory drugs (NSAIDs) and supplements. ° °This website has more information on Xarelto®: www.xarelto.com. ° ° °

## 2017-11-11 NOTE — Anesthesia Procedure Notes (Signed)
Date/Time: 11/11/2017 8:13 AM Performed by: Sharlette Dense, CRNA Oxygen Delivery Method: Simple face mask

## 2017-11-11 NOTE — Anesthesia Procedure Notes (Signed)
Procedure Name: Intubation Date/Time: 11/11/2017 8:37 AM Performed by: Sharlette Dense, CRNA Patient Re-evaluated:Patient Re-evaluated prior to induction Oxygen Delivery Method: Circle system utilized Preoxygenation: Pre-oxygenation with 100% oxygen Induction Type: IV induction Ventilation: Mask ventilation without difficulty and Oral airway inserted - appropriate to patient size Laryngoscope Size: Sabra Heck and 2 Grade View: Grade I Tube type: Oral Tube size: 7.5 mm Number of attempts: 1 Airway Equipment and Method: Stylet Placement Confirmation: ETT inserted through vocal cords under direct vision,  positive ETCO2 and breath sounds checked- equal and bilateral Secured at: 21 cm Tube secured with: Tape Dental Injury: Teeth and Oropharynx as per pre-operative assessment

## 2017-11-11 NOTE — Anesthesia Postprocedure Evaluation (Signed)
Anesthesia Post Note  Patient: Diane Fox  Procedure(s) Performed: RIGHT TOTAL KNEE ARTHROPLASTY (Right Knee)     Patient location during evaluation: PACU Anesthesia Type: Regional and General Level of consciousness: awake and alert Pain management: pain level controlled Vital Signs Assessment: post-procedure vital signs reviewed and stable Respiratory status: spontaneous breathing, nonlabored ventilation, respiratory function stable and patient connected to nasal cannula oxygen Cardiovascular status: blood pressure returned to baseline and stable Postop Assessment: no apparent nausea or vomiting Anesthetic complications: no    Last Vitals:  Vitals:   11/11/17 1115 11/11/17 1215  BP: (!) 158/85 126/70  Pulse: 75 81  Resp: 15 18  Temp: 36.4 C (!) 36.4 C  SpO2: 95% 92%    Last Pain:  Vitals:   11/11/17 1215  TempSrc: Oral  PainSc:                  Crayne S

## 2017-11-11 NOTE — Interval H&P Note (Signed)
History and Physical Interval Note:  11/11/2017 6:40 AM  Diane Fox  has presented today for surgery, with the diagnosis of Osteoarthritis Right Knee  The various methods of treatment have been discussed with the patient and family. After consideration of risks, benefits and other options for treatment, the patient has consented to  Procedure(s): RIGHT TOTAL KNEE ARTHROPLASTY (Right) as a surgical intervention .  The patient's history has been reviewed, patient examined, no change in status, stable for surgery.  I have reviewed the patient's chart and labs.  Questions were answered to the patient's satisfaction.     Pilar Plate [name]

## 2017-11-11 NOTE — Anesthesia Preprocedure Evaluation (Signed)
Anesthesia Evaluation  Patient identified by MRN, date of birth, ID band Patient awake    Reviewed: Allergy & Precautions, H&P , NPO status , Patient's Chart, lab work & pertinent test results  History of Anesthesia Complications (+) PONV and history of anesthetic complications  Airway Mallampati: II   Neck ROM: full    Dental   Pulmonary shortness of breath, asthma ,    breath sounds clear to auscultation       Cardiovascular negative cardio ROS   Rhythm:regular Rate:Normal     Neuro/Psych    GI/Hepatic GERD  ,  Endo/Other    Renal/GU Renal InsufficiencyRenal disease     Musculoskeletal  (+) Arthritis ,   Abdominal   Peds  Hematology   Anesthesia Other Findings   Reproductive/Obstetrics                             Anesthesia Physical Anesthesia Plan  ASA: II  Anesthesia Plan:    Post-op Pain Management:  Regional for Post-op pain   Induction: Intravenous  PONV Risk Score and Plan: 3 and Ondansetron, Dexamethasone, Propofol infusion, Midazolam and Treatment may vary due to age or medical condition  Airway Management Planned: Simple Face Mask  Additional Equipment:   Intra-op Plan:   Post-operative Plan:   Informed Consent: I have reviewed the patients History and Physical, chart, labs and discussed the procedure including the risks, benefits and alternatives for the proposed anesthesia with the patient or authorized representative who has indicated his/her understanding and acceptance.     Plan Discussed with: CRNA, Anesthesiologist and Surgeon  Anesthesia Plan Comments:         Anesthesia Quick Evaluation

## 2017-11-11 NOTE — Evaluation (Signed)
Physical Therapy Evaluation Patient Details Name: Diane Fox MRN: [id number] DOB: Apr 27, 1951 Today's Date: 11/11/2017   History of Present Illness  66 yo female post RTKR, H/O  L TKR  Clinical Impression  The patient transferred to [facility] and back to bed. Has been nauseated. Pt admitted with above diagnosis. Pt currently with functional limitations due to the deficits listed below (see PT Problem List).  Pt will benefit from skilled PT to increase their independence and safety with mobility to allow discharge to the venue listed below.        Follow Up Recommendations DC plan and follow up therapy as arranged by surgeon    Equipment Recommendations  None recommended by PT    Recommendations for Other Services       Precautions / Restrictions Precautions Precautions: Knee;Fall Required Braces or Orthoses: Knee Immobilizer - Right      Mobility  Bed Mobility Overal bed mobility: Needs Assistance Bed Mobility: Supine to Sit;Sit to Supine     Supine to sit: Min assist Sit to supine: Min assist   General bed mobility comments: support  right leg  Transfers Overall transfer level: Needs assistance Equipment used: Rolling walker (2 wheeled) Transfers: Sit to/from [facility] Sit to Stand: Min assist Stand pivot transfers: Min assist       General transfer comment: cues for right leg and hands position, Pivot to [facility] and back to the bed,  Ambulation/Gait                Stairs            Wheelchair Mobility    Modified Rankin (Stroke Patients Only)       Balance                                             Pertinent Vitals/Pain Pain Assessment: 0-10 Pain Score: 3  Pain Location: right knee Pain Descriptors / Indicators: Aching Pain Intervention(s): Monitored during session;Premedicated before session;Ice applied    Home Living Family/patient expects to be discharged to:: Private residence Living  Arrangements: Spouse/significant other Available Help at Discharge: Family Type of Home: House Home Access: Stairs to enter Entrance Stairs-Rails: None Entrance Stairs-Number of Steps: 2 Home Layout: Able to live on main level with bedroom/bathroom Home Equipment: Walker - 2 wheels;Bedside commode      Prior Function Level of Independence: Independent               Hand Dominance        Extremity/Trunk Assessment   Upper Extremity Assessment Upper Extremity Assessment: Overall WFL for tasks assessed    Lower Extremity Assessment Lower Extremity Assessment: RLE deficits/detail RLE Deficits / Details: able to raise leg    Cervical / Trunk Assessment Cervical / Trunk Assessment: Normal  Communication   Communication: No difficulties  Cognition Arousal/Alertness: Awake/alert Behavior During Therapy: WFL for tasks assessed/performed Overall Cognitive Status: Within Functional Limits for tasks assessed                                        General Comments      Exercises Total Joint Exercises Ankle Circles/Pumps: AROM;Both;10 reps Quad Sets: AROM;Both;10 reps   Assessment/Plan    PT Assessment Patient needs continued PT services  PT Problem  List Decreased strength;Decreased range of motion;Decreased activity tolerance;Decreased mobility;Decreased knowledge of precautions;Decreased safety awareness;Decreased knowledge of use of DME       PT Treatment Interventions DME instruction;Gait training;Functional mobility training;Therapeutic activities;Therapeutic exercise;Patient/family education    PT Goals (Current goals can be found in the Care Plan section)  Acute Rehab PT Goals Patient Stated Goal: to walk without pain PT Goal Formulation: With patient/family Time For Goal Achievement: 11/15/17 Potential to Achieve Goals: Good    Frequency 7X/week   Barriers to discharge        Co-evaluation               AM-PAC PT "6 Clicks"  Daily Activity  Outcome Measure Difficulty turning over in bed (including adjusting bedclothes, sheets and blankets)?: A Little Difficulty moving from lying on back to sitting on the side of the bed? : A Little Difficulty sitting down on and standing up from a chair with arms (e.g., wheelchair, bedside commode, etc,.)?: A Lot Help needed moving to and from a bed to chair (including a wheelchair)?: A Lot Help needed walking in hospital room?: A Lot Help needed climbing 3-5 steps with a railing? : Total 6 Click Score: 13    End of Session Equipment Utilized During Treatment: Right knee immobilizer Activity Tolerance: Patient tolerated treatment well Patient left: in bed;with call bell/phone within reach;with family/visitor present Nurse Communication: Mobility status PT Visit Diagnosis: Difficulty in walking, not elsewhere classified (R26.2);Pain Pain - Right/Left: Right Pain - part of body: Knee    Time: 0932-3557 PT Time Calculation (min) (ACUTE ONLY): 24 min   Charges:   PT Evaluation $PT Eval Low Complexity: 1 Low PT Treatments $Therapeutic Activity: 8-22 mins   PT G CodesTresa Endo PT [phone]   Claretha Cooper 11/11/2017, 4:54 PM

## 2017-11-11 NOTE — Plan of Care (Signed)
done

## 2017-11-11 NOTE — Progress Notes (Signed)
Assisted Dr. Hodierne with right, ultrasound guided, adductor canal block. Side rails up, monitors on throughout procedure. See vital signs in flow sheet. Tolerated Procedure well.  

## 2017-11-11 NOTE — Anesthesia Procedure Notes (Signed)
Anesthesia Regional Block: Adductor canal block   Pre-Anesthetic Checklist: ,, timeout performed, Correct Patient, Correct Site, Correct Laterality, Correct Procedure, Correct Position, site marked, Risks and benefits discussed,  Surgical consent,  Pre-op evaluation,  At surgeon's request and post-op pain management  Laterality: Right  Prep: chloraprep       Needles:  Injection technique: Single-shot  Needle Type: Echogenic Needle     Needle Length: 9cm  Needle Gauge: 21     Additional Needles:   Narrative:  Start time: 11/11/2017 8:02 AM End time: 11/11/2017 8:08 AM Injection made incrementally with aspirations every 5 mL.  Performed by: Personally  Anesthesiologist: Albertha Ghee, MD  Additional Notes: Pt tolerated the procedure well.

## 2017-11-11 NOTE — Transfer of Care (Signed)
Immediate Anesthesia Transfer of Care Note  Patient: Diane Fox  Procedure(s) Performed: RIGHT TOTAL KNEE ARTHROPLASTY (Right Knee)  Patient Location: PACU  Anesthesia Type:General  Level of Consciousness: awake, alert  and oriented  Airway & Oxygen Therapy: Patient Spontanous Breathing and Patient connected to face mask oxygen  Post-op Assessment: Report given to RN and Post -op Vital signs reviewed and stable  Post vital signs: Reviewed and stable  Last Vitals:  Vitals:   11/11/17 0807 11/11/17 0808  BP:  120/74  Pulse: 62 63  Resp: 18 16  Temp:    SpO2: 100% 98%    Last Pain:  Vitals:   11/11/17 0617  TempSrc: Oral      Patients Stated Pain Goal: 4 (25/49/82 6415)  Complications: No apparent anesthesia complications

## 2017-11-12 LAB — BASIC METABOLIC PANEL
ANION GAP: 8 (ref 5–15)
BUN: 15 mg/dL (ref 6–20)
CHLORIDE: 104 mmol/L (ref 101–111)
CO2: 28 mmol/L (ref 22–32)
Calcium: 9 mg/dL (ref 8.9–10.3)
Creatinine, Ser: 0.86 mg/dL (ref 0.44–1.00)
GFR calc Af Amer: >60 mL/min (ref >60)
GLUCOSE: 122 mg/dL — AB (ref 65–99)
Potassium: 3.6 mmol/L (ref 3.5–5.1)
SODIUM: 140 mmol/L (ref 135–145)

## 2017-11-12 LAB — CBC
HCT: 37.8 % (ref 36.0–46.0)
Hemoglobin: 12.6 g/dL (ref 12.0–15.0)
MCH: 29.6 pg (ref 26.0–34.0)
MCHC: 33.3 g/dL (ref 30.0–36.0)
MCV: 88.7 fL (ref 78.0–100.0)
PLATELETS: 331 10*3/uL (ref 150–400)
RBC: 4.26 MIL/uL (ref 3.87–5.11)
RDW: 13.3 % (ref 11.5–15.5)
WBC: 13.2 10*3/uL — ABNORMAL HIGH (ref 4.0–10.5)

## 2017-11-12 MED ORDER — METHOCARBAMOL 500 MG PO TABS: 500.0000 mg | ORAL_TABLET | Freq: Four times a day (QID) | ORAL | 0 refills | Status: DC | PRN

## 2017-11-12 MED ORDER — RIVAROXABAN 10 MG PO TABS: 10.0000 mg | ORAL_TABLET | Freq: Every day | ORAL | 0 refills | Status: DC

## 2017-11-12 MED ORDER — TRAMADOL HCL 50 MG PO TABS
50.0000 mg | ORAL_TABLET | Freq: Four times a day (QID) | ORAL | 0 refills | Status: DC | PRN
Start: 2017-11-12 — End: 2017-11-13

## 2017-11-12 MED ORDER — OXYCODONE HCL 5 MG PO TABS: 5.0000 mg | ORAL_TABLET | ORAL | 0 refills | Status: DC | PRN

## 2017-11-12 NOTE — Progress Notes (Signed)
Spoke with patient at bedside. Confirmed plan for OP PT, already arranged. Has RW and 3n1. [phone] 

## 2017-11-12 NOTE — Progress Notes (Signed)
Physical Therapy Treatment Patient Details Name: Diane Fox MRN: [id number] DOB: [birth year] Today's Date: 11/12/2017    History of Present Illness 66 yo female post RTKR, H/O  L TKR    PT Comments    The patient ambulated even with  Complaints of posterior knee pain. Plans Dc tomorrow.   Follow Up Recommendations  DC plan and follow up therapy as arranged by surgeon;Outpatient PT     Equipment Recommendations  None recommended by PT    Recommendations for Other Services       Precautions / Restrictions Precautions Precautions: Knee;Fall Precaution Comments: did not use the KI Required Braces or Orthoses: Knee Immobilizer - Right    Mobility  Bed Mobility Overal bed mobility: Needs Assistance Bed Mobility: Sit to Supine;Supine to Sit     Supine to sit: Supervision Sit to supine: Min assist   General bed mobility comments: assist  with right leg onto bed.  Transfers Overall transfer level: Needs assistance Equipment used: Rolling walker (2 wheeled) Transfers: Sit to/from Stand Sit to Stand: Min guard Stand pivot transfers: Min assist       General transfer comment: for safety  Ambulation/Gait Ambulation/Gait assistance: Min guard Ambulation Distance (Feet): 30 Feet Assistive device: Rolling walker (2 wheeled) Gait Pattern/deviations: Step-to pattern;Antalgic     General Gait Details: ANTALGIC , REPORTS POSTERIOR KNEE IS PAINFUL   Stairs            Wheelchair Mobility    Modified Rankin (Stroke Patients Only)       Balance                                            Cognition Arousal/Alertness: Awake/alert                                            Exercises      General Comments        Pertinent Vitals/Pain Pain Score: 8  Pain Location: right knee- baehind the knee Pain Descriptors / Indicators: Aching Pain Intervention(s): Limited activity within patient's tolerance;Monitored during  session;Premedicated before session;Repositioned;Ice applied    Home Living                      Prior Function            PT Goals (current goals can now be found in the care plan section) Progress towards PT goals: Progressing toward goals    Frequency    7X/week      PT Plan Current plan remains appropriate    Co-evaluation              AM-PAC PT "6 Clicks" Daily Activity  Outcome Measure  Difficulty turning over in bed (including adjusting bedclothes, sheets and blankets)?: A Little Difficulty moving from lying on back to sitting on the side of the bed? : A Little Difficulty sitting down on and standing up from a chair with arms (e.g., wheelchair, bedside commode, etc,.)?: A Little Help needed moving to and from a bed to chair (including a wheelchair)?: A Little Help needed walking in hospital room?: A Lot Help needed climbing 3-5 steps with a railing? : Total 6 Click Score: 15    End of Session   Activity  Tolerance: Patient tolerated treatment well Patient left: in bed;with call bell/phone within reach Nurse Communication: Mobility status PT Visit Diagnosis: Difficulty in walking, not elsewhere classified (R26.2) Pain - Right/Left: Right Pain - part of body: Knee     Time: 4081-4481 PT Time Calculation (min) (ACUTE ONLY): 12 min  Charges:  $Gait Training: 8-22 mins                    G CodesTresa Endo PT [phone]    Claretha Cooper 11/12/2017, 3:08 PM

## 2017-11-12 NOTE — Plan of Care (Signed)
Plan of care discussed with patient.  Patient verbalizes u/o same. Denies questions at this time.

## 2017-11-12 NOTE — Progress Notes (Signed)
Physical Therapy Treatment Patient Details Name: Diane Fox MRN: [id number] DOB: Mar 20, 1951 Today's Date: 11/12/2017    History of Present Illness 66 yo female post RTKR, H/O  L TKR    PT Comments    Patient reports that pain is behind the knee.   Continue PT, plans DC tomorrow.   Follow Up Recommendations  DC plan and follow up therapy as arranged by surgeon     Equipment Recommendations  None recommended by PT    Recommendations for Other Services       Precautions / Restrictions Precautions Precautions: Knee;Fall Precaution Comments: did not use the KI Required Braces or Orthoses: Knee Immobilizer - Right Restrictions Weight Bearing Restrictions: No    Mobility  Bed Mobility Overal bed mobility: Needs Assistance Bed Mobility: Sit to Supine       Sit to supine: Min assist   General bed mobility comments: assist  with right leg onto bed.  Transfers Overall transfer level: Needs assistance Equipment used: Rolling walker (2 wheeled) Transfers: Sit to/from Stand Sit to Stand: Min guard Stand pivot transfers: Min assist       General transfer comment: for safety  Ambulation/Gait Ambulation/Gait assistance: Min assist Ambulation Distance (Feet): 60 Feet Assistive device: Rolling walker (2 wheeled) Gait Pattern/deviations: Step-to pattern;Step-through pattern;Antalgic     General Gait Details: ANTALGIC , REPORTS POSTERIOR KNEE IS PAINFUL   Stairs            Wheelchair Mobility    Modified Rankin (Stroke Patients Only)       Balance                                            Cognition Arousal/Alertness: Awake/alert Behavior During Therapy: WFL for tasks assessed/performed Overall Cognitive Status: Within Functional Limits for tasks assessed                                        Exercises      General Comments        Pertinent Vitals/Pain Pain Score: 6  Pain Location: right knee- baehind  the knee Pain Descriptors / Indicators: Aching Pain Intervention(s): Limited activity within patient's tolerance;Monitored during session;Premedicated before session;Repositioned;Patient requesting pain meds-RN notified;Ice applied    Home Living Family/patient expects to be discharged to:: Private residence Living Arrangements: Spouse/significant other Available Help at Discharge: Family         Home Equipment: Gilford Rile - 2 wheels;Bedside commode      Prior Function Level of Independence: Independent          PT Goals (current goals can now be found in the care plan section) Acute Rehab PT Goals Patient Stated Goal: to walk without pain Progress towards PT goals: Progressing toward goals    Frequency    7X/week      PT Plan Current plan remains appropriate    Co-evaluation              AM-PAC PT "6 Clicks" Daily Activity  Outcome Measure  Difficulty turning over in bed (including adjusting bedclothes, sheets and blankets)?: A Little Difficulty moving from lying on back to sitting on the side of the bed? : A Little Difficulty sitting down on and standing up from a chair with arms (e.g., wheelchair, bedside commode, etc,.)?: A  Little Help needed moving to and from a bed to chair (including a wheelchair)?: A Little Help needed walking in hospital room?: A Lot Help needed climbing 3-5 steps with a railing? : Total 6 Click Score: 15    End of Session   Activity Tolerance: Patient tolerated treatment well Patient left: in bed;with call bell/phone within reach Nurse Communication: Mobility status PT Visit Diagnosis: Difficulty in walking, not elsewhere classified (R26.2) Pain - Right/Left: Right Pain - part of body: Knee     Time: 5075-7322 PT Time Calculation (min) (ACUTE ONLY): 17 min  Charges:  $Gait Training: 8-22 mins                    G CodesTresa Endo PT [phone]   Claretha Cooper 11/12/2017, 11:40 AM

## 2017-11-12 NOTE — Discharge Summary (Signed)
Physician Discharge Summary   Patient ID: Diane Fox MRN: [id number] DOB/AGE: 67-Feb-1952 67 y.o.  Admit date: 11/11/2017 Discharge date: 11/14/2017  Primary Diagnosis:  Osteoarthritis  Right knee(s)   Admission Diagnoses:  Past Medical History:  Diagnosis Date  . Arthritis   . Asthma    allergy related, no attacks  . Atypical chest pain march 2015   april stress test=normal  . 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 Chronic kidney disease   . Edema    maxide prn  . Environmental allergies   . GERD (gastroesophageal reflux disease)   . H/O measles   . H/O mumps   . Hypercholesterolemia   . Peri-menopausal    lexapro prn  . PONV (postoperative nausea and vomiting)    Discharge Diagnoses:   Principal Problem:   OA (osteoarthritis) of knee  Estimated body mass index is 31.71 kg/m as calculated from the following:   Height as of this encounter: _0  (1.6 m).   Weight as of this encounter: 81.2 kg (179 lb).  Procedure:  Procedure(s) (LRB): RIGHT TOTAL KNEE ARTHROPLASTY (Right)   Consults: None  HPI: Diane Fox is a 66 y.o. year old female with end stage OA of her right knee with progressively worsening pain and dysfunction. She has constant pain, with activity and at rest and significant functional deficits with difficulties even with ADLs. She has had extensive non-op management including analgesics, injections of cortisone and viscosupplements, and home exercise program, but remains in significant pain with significant dysfunction.Radiographs show bone on bone arthritis patellofemoral. She presents now for right Total Knee Arthroplasty.     Laboratory Data: Admission on 11/11/2017  Component Date Value Ref Range Status  . WBC 11/12/2017 13.2* 4.0 - 10.5 K/uL Final  . RBC 11/12/2017 4.26  3.87 - 5.11 MIL/uL Final  . Hemoglobin 11/12/2017 12.6  12.0 - 15.0 g/dL Final  . HCT 11/12/2017 37.8  36.0 - 46.0 % Final  . MCV 11/12/2017 88.7   78.0 - 100.0 fL Final  . MCH 11/12/2017 29.6  26.0 - 34.0 pg Final  . MCHC 11/12/2017 33.3  30.0 - 36.0 g/dL Final  . RDW 11/12/2017 13.3  11.5 - 15.5 % Final  . Platelets 11/12/2017 331  150 - 400 K/uL Final  . Sodium 11/12/2017 140  135 - 145 mmol/L Final  . Potassium 11/12/2017 3.6  3.5 - 5.1 mmol/L Final  . Chloride 11/12/2017 104  101 - 111 mmol/L Final  . CO2 11/12/2017 28  22 - 32 mmol/L Final  . Glucose, Bld 11/12/2017 122* 65 - 99 mg/dL Final  . BUN 11/12/2017 15  6 - 20 mg/dL Final  . Creatinine, Ser 11/12/2017 0.86  0.44 - 1.00 mg/dL Final  . Calcium 11/12/2017 9.0  8.9 - 10.3 mg/dL Final  . GFR calc non Af Amer 11/12/2017 >60  >60 mL/min Final  . GFR calc Af Amer 11/12/2017 >60  >60 mL/min Final   Comment: (NOTE) The eGFR has been calculated using the CKD EPI equation. This calculation has not been validated in all clinical situations. eGFR's persistently <60 mL/min signify possible Chronic Kidney Disease.   . Anion gap 11/12/2017 8  5 - 15 Final  . WBC 11/13/2017 14.7* 4.0 - 10.5 K/uL Final  . RBC 11/13/2017 4.27  3.87 - 5.11 MIL/uL Final  . Hemoglobin 11/13/2017 12.7  12.0 - 15.0 g/dL Final  . HCT 11/13/2017 38.2  36.0 - 46.0 % Final  .  MCV 11/13/2017 89.5  78.0 - 100.0 fL Final  . MCH 11/13/2017 29.7  26.0 - 34.0 pg Final  . MCHC 11/13/2017 33.2  30.0 - 36.0 g/dL Final  . RDW 11/13/2017 13.5  11.5 - 15.5 % Final  . Platelets 11/13/2017 340  150 - 400 K/uL Final  . Sodium 11/13/2017 139  135 - 145 mmol/L Final  . Potassium 11/13/2017 3.2* 3.5 - 5.1 mmol/L Final  . Chloride 11/13/2017 103  101 - 111 mmol/L Final  . CO2 11/13/2017 28  22 - 32 mmol/L Final  . Glucose, Bld 11/13/2017 104* 65 - 99 mg/dL Final  . BUN 11/13/2017 16  6 - 20 mg/dL Final  . Creatinine, Ser 11/13/2017 0.89  0.44 - 1.00 mg/dL Final  . Calcium 11/13/2017 8.9  8.9 - 10.3 mg/dL Final  . GFR calc non Af Amer 11/13/2017 >60  >60 mL/min Final  . GFR calc Af Amer 11/13/2017 >60  >60 mL/min Final    Comment: (NOTE) The eGFR has been calculated using the CKD EPI equation. This calculation has not been validated in all clinical situations. eGFR's persistently <60 mL/min signify possible Chronic Kidney Disease.   . Anion gap 11/13/2017 8  5 - 15 Final     X-Rays:Dg Foot Complete Right  Result Date: 10/21/2017 Please see detailed radiograph report in office note.   EKG: Orders placed or performed in visit on 03/04/14  . EKG 12-Lead     Hospital Course: Diane Fox is a 66 y.o. who was admitted to [facility]. They were brought to the operating room on 11/11/2017 and underwent Procedure(s): RIGHT TOTAL KNEE ARTHROPLASTY.  Patient tolerated the procedure well and was later transferred to the recovery room and then to the orthopaedic floor for postoperative care.  They were given PO and IV analgesics for pain control following their surgery.  They were given 24 hours of postoperative antibiotics of  Anti-infectives (From admission, onward)   Start     Dose/Rate Route Frequency Ordered Stop   11/11/17 2000  vancomycin (VANCOCIN) IVPB 1000 mg/200 mL premix     1,000 mg 200 mL/hr over 60 Minutes Intravenous Every 12 hours 11/11/17 1125 11/11/17 2116   11/11/17 0605  vancomycin (VANCOCIN) IVPB 1000 mg/200 mL premix     1,000 mg 200 mL/hr over 60 Minutes Intravenous On call to O.R. 11/11/17 8768 11/11/17 1157     and started on DVT prophylaxis in the form of Xarelto.   PT and OT were ordered for total joint protocol.  Discharge planning consulted to help with postop disposition and equipment needs.  Patient had a rough night on the evening of surgery due to some nausea and vomiting. She was feeling better the next morning. They started to get up OOB with therapy on day one. Hemovac drain was pulled without difficulty.  Continued to work with therapy into day two but was unable to meet all her goals.  Dressing was changed on day two and the incision was healing.  By day three, the  patient had progressed with therapy and meeting their goals.  Incision was healing well.  Patient was seen in rounds and was ready to go home on POD 3.  Home, Outpatient at [place] and Renal diet Follow up - in 2 weeks Activity - WBAT Disposition - Home Condition Upon Discharge - Stable D/C Meds - See DC Summary DVT Prophylaxis - Xarelto   Discharge Instructions    Call MD /  Call 911   Complete by:  As directed    If you experience chest pain or shortness of breath, CALL 911 and be transported to the hospital emergency room.  If you develope a fever above 101 F, pus (white drainage) or increased drainage or redness at the wound, or calf pain, call your surgeon's office.   Change dressing   Complete by:  As directed    Change dressing daily with sterile 4 x 4 inch gauze dressing and apply TED hose. Do not submerge the incision under water.   Constipation Prevention   Complete by:  As directed    Drink plenty of fluids.  Prune juice may be helpful.  You may use a stool softener, such as Colace (over the counter) 100 mg twice a day.  Use MiraLax (over the counter) for constipation as needed.   Diet - low sodium heart healthy   Complete by:  As directed    Discharge instructions   Complete by:  As directed    Take Xarelto for two and a half more weeks, then discontinue Xarelto. Once the patient has completed the blood thinner regimen, then take a Baby 81 mg Aspirin daily for three more weeks.  Pick up stool softner and laxative for home use following surgery while on pain medications. Do not submerge incision under water. Please use good hand washing techniques while changing dressing each day. May shower starting three days after surgery. Please use a clean towel to pat the incision dry following showers. Continue to use ice for pain and swelling after surgery. Do not use any lotions or creams on the incision until instructed by your surgeon.  Wear both TED hose on  both legs during the day every day for three weeks, but may remove the TED hose at night at home.  Postoperative Constipation Protocol  Constipation - defined medically as fewer than three stools per week and severe constipation as less than one stool per week.  One of the most common issues patients have following surgery is constipation.  Even if you have a regular bowel pattern at home, your normal regimen is likely to be disrupted due to multiple reasons following surgery.  Combination of anesthesia, postoperative narcotics, change in appetite and fluid intake all can affect your bowels.  In order to avoid complications following surgery, here are some recommendations in order to help you during your recovery period.  Colace (docusate) - Pick up an over-the-counter form of Colace or another stool softener and take twice a day as long as you are requiring postoperative pain medications.  Take with a full glass of water daily.  If you experience loose stools or diarrhea, hold the colace until you stool forms back up.  If your symptoms do not get better within 1 week or if they get worse, check with your doctor.  Dulcolax (bisacodyl) - Pick up over-the-counter and take as directed by the product packaging as needed to assist with the movement of your bowels.  Take with a full glass of water.  Use this product as needed if not relieved by Colace only.   MiraLax (polyethylene glycol) - Pick up over-the-counter to have on hand.  MiraLax is a solution that will increase the amount of water in your bowels to assist with bowel movements.  Take as directed and can mix with a glass of water, juice, soda, coffee, or tea.  Take if you go more than two days without a movement. Do not use  MiraLax more than once per day. Call your doctor if you are still constipated or irregular after using this medication for 7 days in a row.  If you continue to have problems with postoperative constipation, please contact the  office for further assistance and recommendations.  If you experience "the worst abdominal pain ever" or develop nausea or vomiting, please contact the office immediatly for further recommendations for treatment.   Do not put a pillow under the knee. Place it under the heel.   Complete by:  As directed    Do not sit on low chairs, stoools or toilet seats, as it may be difficult to get up from low surfaces   Complete by:  As directed    Driving restrictions   Complete by:  As directed    No driving until released by the physician.   Increase activity slowly as tolerated   Complete by:  As directed    Lifting restrictions   Complete by:  As directed    No lifting until released by the physician.   Patient may shower   Complete by:  As directed    You may shower without a dressing once there is no drainage.  Do not wash over the wound.  If drainage remains, do not shower until drainage stops.   TED hose   Complete by:  As directed    Use stockings (TED hose) for 3 weeks on both leg(s).  You may remove them at night for sleeping.   Weight bearing as tolerated   Complete by:  As directed    Laterality:  right   Extremity:  Lower     Allergies as of 11/13/2017      Reactions   Biaxin [clarithromycin] Hives   Penicillins Hives   Has patient had a PCN reaction causing immediate rash, facial/tongue/throat swelling, SOB or lightheadedness with hypotension: No Has patient had a PCN reaction causing severe rash involving mucus membranes or skin necrosis: No Has patient had a PCN reaction that required hospitalization: No Has patient had a PCN reaction occurring within the last 10 years: Yes If all of the above answers are "NO", then may proceed with Cephalosporin use.   Sulfa Antibiotics Hives   Zostavax [zoster Vaccine Live] Hives      Medication List    STOP taking these medications   B-complex with vitamin C tablet   CENTRUM SILVER PO   cholecalciferol 1000 units tablet Commonly  known as:  VITAMIN D   CO Q 10 PO   vitamin E 400 UNIT capsule Generic drug:  vitamin E     TAKE these medications   chlorpheniramine 4 MG tablet Commonly known as:  CHLOR-TRIMETON Take 4 mg by mouth daily as needed for allergies.   fexofenadine 180 MG tablet Commonly known as:  ALLEGRA Take 180 mg by mouth daily.   HYDROmorphone 2 MG tablet Commonly known as:  DILAUDID Take 1 tablet (2 mg total) by mouth every 4 (four) hours as needed for moderate pain.   methocarbamol 500 MG tablet Commonly known as:  ROBAXIN Take 1 tablet (500 mg total) by mouth every 6 (six) hours as needed for muscle spasms.   NASACORT ALLERGY 24HR 55 MCG/ACT Aero nasal inhaler Generic drug:  triamcinolone Place 2 sprays into the nose daily.   omeprazole 20 MG capsule Commonly known as:  PRILOSEC Take 20 mg by mouth daily.   rivaroxaban 10 MG Tabs tablet Commonly known as:  XARELTO Take 1 tablet (10  mg total) by mouth daily with breakfast. Take Xarelto for two and a half more weeks following discharge from the hospital, then discontinue Xarelto. Once the patient has completed the blood thinner regimen, then take a Baby 81 mg Aspirin daily for three more weeks.   rosuvastatin 5 MG tablet Commonly known as:  CRESTOR TAKE 1 TABLET BY MOUTH AT  BEDTIME   traMADol 50 MG tablet Commonly known as:  ULTRAM Take 1 tablet (50 mg total) by mouth every 6 (six) hours as needed for moderate pain.   triamterene-hydrochlorothiazide 75-50 MG tablet Commonly known as:  MAXZIDE Take 1 tablet by mouth every morning.            Discharge Care Instructions  (From admission, onward)        Start     Ordered   11/12/17 0000  Weight bearing as tolerated    Question Answer Comment  Laterality right   Extremity Lower      11/12/17 2219   11/12/17 0000  Change dressing    Comments:  Change dressing daily with sterile 4 x 4 inch gauze dressing and apply TED hose. Do not submerge the incision under water.     11/12/17 2219     Follow-up Information    Gaynelle Arabian, MD. Schedule an appointment as soon as possible for a visit on 11/26/2017.   Specialty:  Orthopedic Surgery Contact information: [address] [place] [address] [phone]           Signed: [name], PA-C Orthopaedic Surgery 11/13/2017, 7:11 AM

## 2017-11-12 NOTE — Progress Notes (Signed)
   Subjective: 1 Day Post-Op Procedure(s) (LRB): RIGHT TOTAL KNEE ARTHROPLASTY (Right) Patient reports pain as mild and moderate.   Patient seen in rounds for Dr. Wynelle Link on morning rounds. Patient is well, but has had some minor complaints of pain in the knee, requiring pain medications She walked twice with therapy today. Plan is to go Home after hospital stay.  Objective: Vital signs in last 24 hours: Temp:  [97.9 F (36.6 C)-98.7 F (37.1 C)] 98.7 F (37.1 C) (11/27 2108) Pulse Rate:  [69-93] 88 (11/27 2108) Resp:  [15-20] 15 (11/27 2108) BP: (117-151)/(60-74) 151/74 (11/27 2108) SpO2:  [97 %-99 %] 97 % (11/27 2108)  Intake/Output from previous day:  Intake/Output Summary (Last 24 hours) at 11/12/2017 2214 Last data filed at 11/12/2017 2109 Gross per 24 hour  Intake 1747.5 ml  Output 1185 ml  Net 562.5 ml    Intake/Output this shift: Total I/O In: 160 [P.O.:160] Out: -   Labs: Recent Labs    11/12/17 0533  HGB 12.6   Recent Labs    11/12/17 0533  WBC 13.2*  RBC 4.26  HCT 37.8  PLT 331   Recent Labs    11/12/17 0533  NA 140  K 3.6  CL 104  CO2 28  BUN 15  CREATININE 0.86  GLUCOSE 122*  CALCIUM 9.0   No results for input(s): LABPT, INR in the last 72 hours.  EXAM General - Patient is Alert, Appropriate and Oriented Extremity - Neurovascular intact Sensation intact distally Intact pulses distally Dorsiflexion/Plantar flexion intact Dressing - dressing C/D/I Motor Function - intact, moving foot and toes well on exam.  Hemovac pulled without difficulty.  Past Medical History:  Diagnosis Date  . Arthritis   . Asthma    allergy related, no attacks  . Atypical chest pain march 2015   april stress test=normal  . 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 Chronic kidney disease   . Edema    maxide prn  . Environmental allergies   . GERD (gastroesophageal reflux disease)   . H/O measles   . H/O mumps   .  Hypercholesterolemia   . Peri-menopausal    lexapro prn  . PONV (postoperative nausea and vomiting)     Assessment/Plan: 1 Day Post-Op Procedure(s) (LRB): RIGHT TOTAL KNEE ARTHROPLASTY (Right) Principal Problem:   OA (osteoarthritis) of knee  Estimated body mass index is 31.71 kg/m as calculated from the following:   Height as of this encounter: 5\' 3"  (1.6 m).   Weight as of this encounter: 81.2 kg (179 lb). Advance diet Up with therapy Plan for discharge tomorrow  Straight to outpatient therapy  DVT Prophylaxis - Xarelto Weight-Bearing as tolerated to right leg D/C O2 and Pulse OX and try on Room Air  [name], PA-C Orthopaedic Surgery 11/12/2017, 10:14 PM

## 2017-11-12 NOTE — Progress Notes (Signed)
   11/12/17 1204  PT Visit Information  Last PT Received On 11/12/17  Assistance Needed +1  Precautions  Precautions Knee;Fall  Pain Assessment  Pain Score 6  Pain Location right knee  Pain Intervention(s) Premedicated before session  Bed Mobility  Overal bed mobility Needs Assistance  Bed Mobility Supine to Sit;Sit to Supine  Supine to sit Supervision  Sit to supine Min assist  Transfers  Equipment used Rolling walker (2 wheeled)  Transfers Sit to/from Stand  Sit to Stand Supervision  Ambulation/Gait  Ambulation/Gait assistance Supervision  Ambulation Distance (Feet) 20 Feet  Gait Pattern/deviations Step-to pattern  General Gait Details antalgic  Total Joint Exercises  Ankle Circles/Pumps AROM  Quad Sets AROM  Towel Squeeze AROM;10 reps  Heel Slides AAROM;Right;10 reps  Hip ABduction/ADduction AROM;10 reps  Straight Leg Raises AAROM;Right;10 reps  PT - End of Session  Activity Tolerance Patient tolerated treatment well  Patient left in bed;with call bell/phone within reach  PT - Assessment/Plan  PT Plan Current plan remains appropriate  PT Visit Diagnosis Difficulty in walking, not elsewhere classified (R26.2)  Pain - Right/Left Left  Pain - part of body Knee  Follow Up Recommendations DC plan and follow up therapy as arranged by surgeon  AM-PAC PT "6 Clicks" Daily Activity Outcome Measure  Difficulty turning over in bed (including adjusting bedclothes, sheets and blankets)? 3  Difficulty moving from lying on back to sitting on the side of the bed?  3  Difficulty sitting down on and standing up from a chair with arms (e.g., wheelchair, bedside commode, etc,.)? 3  Help needed moving to and from a bed to chair (including a wheelchair)? 3  Help needed walking in hospital room? 2  Help needed climbing 3-5 steps with a railing?  1  6 Click Score 15  Mobility G Code  CK  PT Goal Progression  Progress towards PT goals Progressing toward goals  PT Time Calculation  PT  Start Time (ACUTE ONLY) 1130  PT Stop Time (ACUTE ONLY) 1204  PT Time Calculation (min) (ACUTE ONLY) 34 min  PT General Charges  $$ ACUTE PT VISIT 1 Visit  PT Treatments  $Gait Training 8-22 mins  $Therapeutic Exercise 8-22 mins  [place] [facility] [phone]

## 2017-11-12 NOTE — Evaluation (Signed)
Occupational Therapy Evaluation Patient Details Name: Diane Fox MRN: [id number] DOB: 1951-08-07 Today's Date: 11/12/2017    History of Present Illness 66 yo female post RTKR, H/O  L TKR '14   Clinical Impression   Pt was admitted for the above sx.  All education was completed. No further OT is needed at this time   Follow Up Recommendations  Supervision/Assistance - 24 hour    Equipment Recommendations  None recommended by OT    Recommendations for Other Services       Precautions / Restrictions Precautions Precautions: Knee;Fall Required Braces or Orthoses: Knee Immobilizer - Right Restrictions Weight Bearing Restrictions: No      Mobility Bed Mobility               General bed mobility comments: oob  Transfers   Equipment used: Rolling walker (2 wheeled)   Sit to Stand: Min guard         General transfer comment: for safety    Balance                                           ADL either performed or assessed with clinical judgement   ADL Overall ADL's : Needs assistance/impaired Eating/Feeding: Independent   Grooming: Supervision/safety;Standing;Oral care   Upper Body Bathing: Set up;Sitting   Lower Body Bathing: Minimal assistance;Sit to/from stand   Upper Body Dressing : Set up;Sitting   Lower Body Dressing: Moderate assistance;Sit to/from stand   Toilet Transfer: Min guard;Ambulation;RW(chair)   Toileting- Water quality scientist and Hygiene: Min guard;Sit to/from stand         General ADL Comments: reviewed tub readiness.  Husband will assist with adls as needed. Pt used commode earlier and has 3:1 to go over it     Vision         Perception     Praxis      Pertinent Vitals/Pain Pain Score: 8  Pain Location: right knee Pain Descriptors / Indicators: Aching Pain Intervention(s): Limited activity within patient's tolerance;Monitored during session;Premedicated before session;Repositioned;Ice applied      Hand Dominance     Extremity/Trunk Assessment Upper Extremity Assessment Upper Extremity Assessment: Overall WFL for tasks assessed           Communication Communication Communication: No difficulties   Cognition Arousal/Alertness: Awake/alert Behavior During Therapy: WFL for tasks assessed/performed Overall Cognitive Status: Within Functional Limits for tasks assessed                                     General Comments       Exercises     Shoulder Instructions      Home Living Family/patient expects to be discharged to:: Private residence Living Arrangements: Spouse/significant other Available Help at Discharge: Family               Bathroom Shower/Tub: Teacher, early years/pre: Standard     Home Equipment: Environmental consultant - 2 wheels;Bedside commode          Prior Functioning/Environment Level of Independence: Independent                 OT Problem List:        OT Treatment/Interventions:      OT Goals(Current goals can be found in the care plan section) Acute  Rehab OT Goals Patient Stated Goal: to walk without pain OT Goal Formulation: All assessment and education complete, DC therapy  OT Frequency:     Barriers to D/C:            Co-evaluation              AM-PAC PT "6 Clicks" Daily Activity     Outcome Measure Help from another person eating meals?: None Help from another person taking care of personal grooming?: A Little Help from another person toileting, which includes using toliet, bedpan, or urinal?: A Little Help from another person bathing (including washing, rinsing, drying)?: A Little Help from another person to put on and taking off regular upper body clothing?: A Little Help from another person to put on and taking off regular lower body clothing?: A Lot 6 Click Score: 18   End of Session    Activity Tolerance: Patient tolerated treatment well Patient left: in chair;with call bell/phone  within reach  OT Visit Diagnosis: Pain Pain - Right/Left: Right Pain - part of body: Knee                Time: 2536-6440 OT Time Calculation (min): 17 min Charges:  OT General Charges $OT Visit: 1 Visit OT Evaluation $OT Eval Low Complexity: 1 Low G-Codes:     [place], OTR/L [phone] 11/12/2017  Diane Fox 11/12/2017, 10:07 AM

## 2017-11-13 LAB — BASIC METABOLIC PANEL
Anion gap: 8 (ref 5–15)
BUN: 16 mg/dL (ref 6–20)
CALCIUM: 8.9 mg/dL (ref 8.9–10.3)
CO2: 28 mmol/L (ref 22–32)
Chloride: 103 mmol/L (ref 101–111)
Creatinine, Ser: 0.89 mg/dL (ref 0.44–1.00)
GFR calc Af Amer: >60 mL/min (ref >60)
Glucose, Bld: 104 mg/dL — ABNORMAL HIGH (ref 65–99)
POTASSIUM: 3.2 mmol/L — AB (ref 3.5–5.1)
Sodium: 139 mmol/L (ref 135–145)

## 2017-11-13 LAB — CBC
HEMATOCRIT: 38.2 % (ref 36.0–46.0)
Hemoglobin: 12.7 g/dL (ref 12.0–15.0)
MCH: 29.7 pg (ref 26.0–34.0)
MCHC: 33.2 g/dL (ref 30.0–36.0)
MCV: 89.5 fL (ref 78.0–100.0)
Platelets: 340 10*3/uL (ref 150–400)
RBC: 4.27 MIL/uL (ref 3.87–5.11)
RDW: 13.5 % (ref 11.5–15.5)
WBC: 14.7 10*3/uL — ABNORMAL HIGH (ref 4.0–10.5)

## 2017-11-13 MED ORDER — POTASSIUM CHLORIDE CRYS ER 20 MEQ PO TBCR
40.0000 meq | EXTENDED_RELEASE_TABLET | Freq: Every day | ORAL | Status: DC
  Administered 2017-11-13 – 2017-11-14 (×2): 40 meq via ORAL
  Filled 2017-11-13 (×2): qty 2

## 2017-11-13 MED ORDER — RIVAROXABAN 10 MG PO TABS: 10.0000 mg | ORAL_TABLET | Freq: Every day | ORAL | 0 refills | Status: DC

## 2017-11-13 MED ORDER — METHOCARBAMOL 500 MG PO TABS: 500.0000 mg | ORAL_TABLET | Freq: Four times a day (QID) | ORAL | 0 refills | Status: DC | PRN

## 2017-11-13 MED ORDER — TRAMADOL HCL 50 MG PO TABS: 50.0000 mg | ORAL_TABLET | Freq: Four times a day (QID) | ORAL | 0 refills | Status: DC | PRN

## 2017-11-13 MED ORDER — HYDROMORPHONE HCL 2 MG PO TABS
4.0000 mg | ORAL_TABLET | ORAL | Status: DC | PRN
  Administered 2017-11-13 – 2017-11-14 (×4): 4 mg via ORAL
  Filled 2017-11-13 (×4): qty 2

## 2017-11-13 MED ORDER — HYDROMORPHONE HCL 2 MG PO TABS
2.0000 mg | ORAL_TABLET | ORAL | Status: DC | PRN
  Administered 2017-11-13: 2 mg via ORAL
  Filled 2017-11-13: qty 1

## 2017-11-13 MED ORDER — HYDROMORPHONE HCL 2 MG PO TABS: 2.0000 mg | ORAL_TABLET | ORAL | 0 refills | Status: DC | PRN

## 2017-11-13 NOTE — Progress Notes (Signed)
Physical Therapy Treatment Patient Details Name: Diane Fox MRN: [id number] DOB: 29-Sep-1951 Today's Date: 11/13/2017    History of Present Illness 66 yo female post RTKR, H/O  L TKR    PT Comments    Pt reports dizziness improved however now pain has increased.  Pt ambulated short distance and practiced one step however felt unable to tolerate further.  (Pt has 3 deep steps to enter home).  Pt and spouse report not feeling ready for d/c yet (RN aware).      Follow Up Recommendations  DC plan and follow up therapy as arranged by surgeon;Home health PT     Equipment Recommendations  None recommended by PT    Recommendations for Other Services       Precautions / Restrictions Precautions Precautions: Knee;Fall Precaution Comments: did not use the KI Restrictions Other Position/Activity Restrictions: WBAT    Mobility  Bed Mobility Overal bed mobility: Needs Assistance Bed Mobility: Sit to Supine     Supine to sit: Supervision Sit to supine: Min assist   General bed mobility comments: assist for R LE onto bed due to pain  Transfers Overall transfer level: Needs assistance Equipment used: Rolling walker (2 wheeled) Transfers: Sit to/from Stand Sit to Stand: Min guard         General transfer comment: verbal cues for UE and LE positioning, pt called out from bathroom on arrival  Ambulation/Gait Ambulation/Gait assistance: Min guard Ambulation Distance (Feet): 30 Feet Assistive device: Rolling walker (2 wheeled) Gait Pattern/deviations: Step-to pattern;Antalgic     General Gait Details: very antalgic gait, keeps knee remained in flexion throughout, pt denies dizziness this afternoon however reports increased pain, distance limited to ambulating to/from one step   Stairs Stairs: Yes   Stair Management: Step to pattern;Backwards;With walker Number of Stairs: 1 General stair comments: pt with increased pain so attempted backwards with RW, pt able to  perform however reports increased pain, felt unable to perform again (has 3 steps at home)  Wheelchair Mobility    Modified Rankin (Stroke Patients Only)       Balance                                            Cognition Arousal/Alertness: Awake/alert Behavior During Therapy: WFL for tasks assessed/performed Overall Cognitive Status: Within Functional Limits for tasks assessed                                        Exercises Total Joint Exercises Quad Sets: AROM;10 reps;Right Heel Slides: AAROM;Right;10 reps    General Comments        Pertinent Vitals/Pain Pain Assessment: 0-10 Pain Score: 7  Pain Location: right knee Pain Descriptors / Indicators: Sore;Aching Pain Intervention(s): Repositioned;Limited activity within patient's tolerance;Monitored during session;Ice applied    Home Living                      Prior Function            PT Goals (current goals can now be found in the care plan section) Progress towards PT goals: Progressing toward goals    Frequency    7X/week      PT Plan Current plan remains appropriate    Co-evaluation  AM-PAC PT "6 Clicks" Daily Activity  Outcome Measure  Difficulty turning over in bed (including adjusting bedclothes, sheets and blankets)?: A Little Difficulty moving from lying on back to sitting on the side of the bed? : A Little Difficulty sitting down on and standing up from a chair with arms (e.g., wheelchair, bedside commode, etc,.)?: Unable Help needed moving to and from a bed to chair (including a wheelchair)?: A Little Help needed walking in hospital room?: A Little Help needed climbing 3-5 steps with a railing? : A Lot 6 Click Score: 15    End of Session   Activity Tolerance: Patient limited by pain Patient left: in bed;with call bell/phone within reach;with family/visitor present Nurse Communication: Mobility status PT Visit Diagnosis:  Difficulty in walking, not elsewhere classified (R26.2);Pain Pain - Right/Left: Right Pain - part of body: Knee     Time: 2202-5427 PT Time Calculation (min) (ACUTE ONLY): 20 min  Charges:  $Gait Training: 8-22 mins                    G Codes:       Carmelia Bake, PT, DPT 11/13/2017 Pager: [phone]  York Ram E 11/13/2017, 3:31 PM

## 2017-11-13 NOTE — Progress Notes (Signed)
Physical Therapy Treatment Patient Details Name: Diane Fox MRN: [id number] DOB: Jan 17, 1951 Today's Date: 11/13/2017    History of Present Illness 66 yo female post RTKR, H/O  L TKR    PT Comments    Pt attempted ambulating however once about to practice step, pt reported dizziness and nausea.  Chair brought behind pt to sit down.  BP 103/61 mmHg and RN to bedside.  Follow Up Recommendations  DC plan and follow up therapy as arranged by surgeon;Home health PT(OP PT planned, would benefit from HHPT)     Equipment Recommendations  None recommended by PT    Recommendations for Other Services       Precautions / Restrictions Precautions Precautions: Knee;Fall Precaution Comments: did not use the KI Restrictions Other Position/Activity Restrictions: WBAT    Mobility  Bed Mobility Overal bed mobility: Needs Assistance Bed Mobility: Supine to Sit;Sit to Supine     Supine to sit: Supervision Sit to supine: Mod assist   General bed mobility comments: assist for LEs onto bed due to fatigue/dizziness  Transfers Overall transfer level: Needs assistance Equipment used: Rolling walker (2 wheeled) Transfers: Sit to/from Stand Sit to Stand: Min assist         General transfer comment: verbal cues for UE and LE positioning, min/guard initially however pt became dizzy and nauseated during ambulation and required more assist   Ambulation/Gait Ambulation/Gait assistance: Min assist Ambulation Distance (Feet): 15 Feet Assistive device: Rolling walker (2 wheeled) Gait Pattern/deviations: Step-to pattern;Antalgic     General Gait Details: very antalgic gait, keeps knee remained in flexion throughout, required chair rolled back to bed due to nausea and dizziness, BP dropped to 103/61 mmHg, HR 80 bpm, RN present and assisted   Stairs            Wheelchair Mobility    Modified Rankin (Stroke Patients Only)       Balance                                             Cognition Arousal/Alertness: Awake/alert Behavior During Therapy: WFL for tasks assessed/performed Overall Cognitive Status: Within Functional Limits for tasks assessed                                        Exercises      General Comments        Pertinent Vitals/Pain Pain Assessment: 0-10 Pain Score: 6  Pain Location: right knee Pain Descriptors / Indicators: Sore;Aching Pain Intervention(s): Limited activity within patient's tolerance;Repositioned;Monitored during session;Premedicated before session    Home Living                      Prior Function            PT Goals (current goals can now be found in the care plan section) Progress towards PT goals: Progressing toward goals    Frequency    7X/week      PT Plan Current plan remains appropriate    Co-evaluation              AM-PAC PT "6 Clicks" Daily Activity  Outcome Measure  Difficulty turning over in bed (including adjusting bedclothes, sheets and blankets)?: A Little Difficulty moving from lying on back to sitting on  the side of the bed? : A Little Difficulty sitting down on and standing up from a chair with arms (e.g., wheelchair, bedside commode, etc,.)?: Unable Help needed moving to and from a bed to chair (including a wheelchair)?: A Little Help needed walking in hospital room?: A Lot Help needed climbing 3-5 steps with a railing? : Total 6 Click Score: 13    End of Session   Activity Tolerance: Treatment limited secondary to medical complications (Comment) Patient left: in bed;with call bell/phone within reach;with nursing/sitter in room   PT Visit Diagnosis: Difficulty in walking, not elsewhere classified (R26.2)     Time: 3825-0539 PT Time Calculation (min) (ACUTE ONLY): 20 min  Charges:  $Gait Training: 8-22 mins                    G Codes:       Carmelia Bake, PT, DPT 11/13/2017 Pager: [phone]  York Ram E 11/13/2017,  12:47 PM

## 2017-11-14 LAB — CBC
HEMATOCRIT: 36.6 % (ref 36.0–46.0)
Hemoglobin: 12.2 g/dL (ref 12.0–15.0)
MCH: 30 pg (ref 26.0–34.0)
MCHC: 33.3 g/dL (ref 30.0–36.0)
MCV: 89.9 fL (ref 78.0–100.0)
Platelets: 320 10*3/uL (ref 150–400)
RBC: 4.07 MIL/uL (ref 3.87–5.11)
RDW: 13.6 % (ref 11.5–15.5)
WBC: 11.6 10*3/uL — AB (ref 4.0–10.5)

## 2017-11-14 MED ORDER — TRAMADOL HCL 50 MG PO TABS: 50.0000 mg | ORAL_TABLET | Freq: Four times a day (QID) | ORAL | Status: DC | PRN

## 2017-11-14 MED FILL — traMADol HCL 50 MG TABS: 50 | 14 days supply | Qty: 56 | Fill #0

## 2017-11-14 MED FILL — METHOCARBAMOL 500 MG TABS: 500 | 20 days supply | Qty: 80 | Fill #0

## 2017-11-14 MED FILL — HYDROmorphone HCL 2 MG TABS: 2 | 13 days supply | Qty: 80 | Fill #0

## 2017-11-14 MED FILL — XARELTO 10 MG TABLET: 10 | 19 days supply | Qty: 19 | Fill #0

## 2017-11-14 NOTE — Progress Notes (Signed)
   Subjective: 3 Days Post-Op Procedure(s) (LRB): RIGHT TOTAL KNEE ARTHROPLASTY (Right) Patient reports pain as mild.   Patient seen in rounds with Dr. Wynelle Link. Patient is well, and has had no acute complaints or problems Patient is ready to go home today. She did not meet all her therapy goals yesterday so kept and plan for home later today following therapy.  Objective: Vital signs in last 24 hours: Temp:  [97.7 F (36.5 C)-98.8 F (37.1 C)] 97.7 F (36.5 C) (11/29 0448) Pulse Rate:  [76-94] 76 (11/29 0448) Resp:  [16-18] 18 (11/29 0448) BP: (103-158)/(66-84) 156/84 (11/29 0448) SpO2:  [94 %-97 %] 94 % (11/29 0448)  Intake/Output from previous day:  Intake/Output Summary (Last 24 hours) at 11/14/2017 0938 Last data filed at 11/14/2017 0813 Gross per 24 hour  Intake 360 ml  Output -  Net 360 ml    Intake/Output this shift: Total I/O In: 360 [P.O.:360] Out: -   Labs: Recent Labs    11/12/17 0533 11/13/17 0516 11/14/17 0539  HGB 12.6 12.7 12.2   Recent Labs    11/13/17 0516 11/14/17 0539  WBC 14.7* 11.6*  RBC 4.27 4.07  HCT 38.2 36.6  PLT 340 320   Recent Labs    11/12/17 0533 11/13/17 0516  NA 140 139  K 3.6 3.2*  CL 104 103  CO2 28 28  BUN 15 16  CREATININE 0.86 0.89  GLUCOSE 122* 104*  CALCIUM 9.0 8.9   No results for input(s): LABPT, INR in the last 72 hours.  EXAM: General - Patient is Alert, Appropriate and Oriented Extremity - Neurovascular intact Sensation intact distally Intact pulses distally Incision - clean, dry Motor Function - intact, moving foot and toes well on exam.   Assessment/Plan: 3 Days Post-Op Procedure(s) (LRB): RIGHT TOTAL KNEE ARTHROPLASTY (Right) Procedure(s) (LRB): RIGHT TOTAL KNEE ARTHROPLASTY (Right) Past Medical History:  Diagnosis Date  . Arthritis   . Asthma    allergy related, no attacks  . Atypical chest pain march 2015   april stress test=normal  . 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 Chronic kidney disease   . Edema    maxide prn  . Environmental allergies   . GERD (gastroesophageal reflux disease)   . H/O measles   . H/O mumps   . Hypercholesterolemia   . Peri-menopausal    lexapro prn  . PONV (postoperative nausea and vomiting)    Principal Problem:   OA (osteoarthritis) of knee  Estimated body mass index is 31.71 kg/m as calculated from the following:   Height as of this encounter: 5\' 3"  (1.6 m).   Weight as of this encounter: 81.2 kg (179 lb). Up with therapy Home, Outpatient at [facility] and Renal diet Follow up - in 2 weeks Activity - WBAT Disposition - Home Condition Upon Discharge - Stable D/C Meds - See DC Summary DVT Prophylaxis - Xarelto  [name], PA-C Orthopaedic Surgery 11/14/2017, 9:38 AM

## 2017-11-14 NOTE — Progress Notes (Signed)
LATE ENTRY NOTE Date of Service of Visit - 11/13/2017 Wednesday    Subjective: 2 Days Post-Op Procedure(s) (LRB): RIGHT TOTAL KNEE ARTHROPLASTY (Right) Patient reports pain as mild.   Patient seen in rounds by Dr. Wynelle Link. Patient is well, but has had some minor complaints of pain in the knee, requiring pain medications Plan is to go Home after hospital stay.  Seen in rounds on day two and setup for possible home later today if does well with therapy  Objective: Vital signs in last 24 hours: Temp:  [97.7 F (36.5 C)-98.8 F (37.1 C)] 97.7 F (36.5 C) (11/29 0448) Pulse Rate:  [76-94] 76 (11/29 0448) Resp:  [16-18] 18 (11/29 0448) BP: (103-158)/(66-84) 156/84 (11/29 0448) SpO2:  [94 %-97 %] 94 % (11/29 0448)  Intake/Output from previous day:  Intake/Output Summary (Last 24 hours) at 11/14/2017 0936 Last data filed at 11/14/2017 0813 Gross per 24 hour  Intake 360 ml  Output -  Net 360 ml    Intake/Output this shift: Total I/O In: 360 [P.O.:360] Out: -   Labs: Recent Labs    11/12/17 0533 11/13/17 0516   HGB 12.6 12.7    Recent Labs    11/13/17 0516   WBC 14.7*   RBC 4.27   HCT 38.2   PLT 340    Recent Labs    11/12/17 0533 11/13/17 0516  NA 140 139  K 3.6 3.2*  CL 104 103  CO2 28 28  BUN 15 16  CREATININE 0.86 0.89  GLUCOSE 122* 104*  CALCIUM 9.0 8.9   No results for input(s): LABPT, INR in the last 72 hours.  EXAM General - Patient is Alert and Appropriate Extremity - Neurovascular intact Sensation intact distally Intact pulses distally Dressing/Incision - clean, dry Motor Function - intact, moving foot and toes well on exam.   Past Medical History:  Diagnosis Date  . Arthritis   . Asthma    allergy related, no attacks  . Atypical chest pain march 2015   april stress test=normal  . 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 Chronic kidney disease   . Edema    maxide prn  . Environmental allergies   . GERD  (gastroesophageal reflux disease)   . H/O measles   . H/O mumps   . Hypercholesterolemia   . Peri-menopausal    lexapro prn  . PONV (postoperative nausea and vomiting)     Assessment/Plan: 2 Days Post-Op Procedure(s) (LRB): RIGHT TOTAL KNEE ARTHROPLASTY (Right) Principal Problem:   OA (osteoarthritis) of knee  Estimated body mass index is 31.71 kg/m as calculated from the following:   Height as of this encounter: 5\' 3"  (1.6 m).   Weight as of this encounter: 81.2 kg (179 lb). Up with therapy Discharge home - straight to outpatient therapy  DVT Prophylaxis - Xarelto Weight-Bearing as tolerated to right leg  [name], PA-C Orthopaedic Surgery 11/14/2017, 9:36 AM

## 2017-11-14 NOTE — Progress Notes (Signed)
Physical Therapy Treatment Patient Details Name: Diane Fox MRN: [id number] DOB: September 19, 1951 Today's Date: 11/14/2017    History of Present Illness 66 yo female post RTKR, H/O  L TKR    PT Comments    Pt ambulated and practiced one step and reports improvement in ability with wearing KI.  Pt also performed LE exercises however requiring assist due to pain; pt also reviewed HEP handout while performing exercises.  Pt feels ready for d/c home today.  Pt reports OP PT appointment moved to Dec 7 and due to her decreased mobility and pain, requested HHPT (verbally to PA).   Follow Up Recommendations  DC plan and follow up therapy as arranged by surgeon;Home health PT     Equipment Recommendations  None recommended by PT    Recommendations for Other Services       Precautions / Restrictions Precautions Precautions: Knee;Fall Precaution Comments: used KI today for better pain control, pt reports improved pain/mobility with KI, instructed to use at home (don/doff) Required Braces or Orthoses: Knee Immobilizer - Right Restrictions Other Position/Activity Restrictions: WBAT    Mobility  Bed Mobility Overal bed mobility: Needs Assistance Bed Mobility: Supine to Sit;Sit to Supine     Supine to sit: Min assist Sit to supine: Min assist   General bed mobility comments: assist for R LE due to pain  Transfers Overall transfer level: Needs assistance Equipment used: Rolling walker (2 wheeled) Transfers: Sit to/from Stand Sit to Stand: Min guard         General transfer comment: verbal cues for UE and LE positioning, min/guard for safety  Ambulation/Gait Ambulation/Gait assistance: Min guard Ambulation Distance (Feet): 30 Feet Assistive device: Rolling walker (2 wheeled) Gait Pattern/deviations: Step-to pattern;Antalgic     General Gait Details: pt reports pain better with KI in place, limited distance to save energy for d/c home per pt   Stairs Stairs: Yes    Stair Management: Step to pattern;Backwards;With walker Number of Stairs: 1 General stair comments: verbal cues for safety and technique, pt reports improved ability to perform today  Wheelchair Mobility    Modified Rankin (Stroke Patients Only)       Balance                                            Cognition Arousal/Alertness: Awake/alert Behavior During Therapy: WFL for tasks assessed/performed Overall Cognitive Status: Within Functional Limits for tasks assessed                                        Exercises Total Joint Exercises Ankle Circles/Pumps: AROM;Both;10 reps Quad Sets: AROM;10 reps;Right Towel Squeeze: AROM;10 reps;Both Short Arc Quad: (unable to tolerate today) Heel Slides: AAROM;Right;10 reps Hip ABduction/ADduction: 10 reps;AAROM;Right Straight Leg Raises: AAROM;Right;10 reps    General Comments        Pertinent Vitals/Pain Pain Assessment: 0-10 Pain Score: 6  Pain Location: right knee Pain Descriptors / Indicators: Sore;Aching Pain Intervention(s): Limited activity within patient's tolerance;Repositioned;Ice applied;Premedicated before session    Home Living                      Prior Function            PT Goals (current goals can now be found in the  care plan section) Progress towards PT goals: Progressing toward goals    Frequency    7X/week      PT Plan Current plan remains appropriate    Co-evaluation              AM-PAC PT "6 Clicks" Daily Activity  Outcome Measure  Difficulty turning over in bed (including adjusting bedclothes, sheets and blankets)?: A Little Difficulty moving from lying on back to sitting on the side of the bed? : A Little Difficulty sitting down on and standing up from a chair with arms (e.g., wheelchair, bedside commode, etc,.)?: Unable Help needed moving to and from a bed to chair (including a wheelchair)?: A Little Help needed walking in hospital  room?: A Little Help needed climbing 3-5 steps with a railing? : A Little 6 Click Score: 16    End of Session Equipment Utilized During Treatment: Right knee immobilizer Activity Tolerance: Patient tolerated treatment well Patient left: in bed;with call bell/phone within reach Nurse Communication: Mobility status PT Visit Diagnosis: Difficulty in walking, not elsewhere classified (R26.2);Pain Pain - part of body: Knee     Time: 2482-5003 PT Time Calculation (min) (ACUTE ONLY): 29 min  Charges:  $Gait Training: 8-22 mins $Therapeutic Exercise: 8-22 mins                    G Codes:       Diane Fox, PT, DPT 11/14/2017 Pager: [phone]  York Ram E 11/14/2017, 1:46 PM

## 2017-11-14 NOTE — Care Management Important Message (Signed)
Important Message  Patient Details  Name: SHENEKIA RIESS MRN: [id number] Date of Birth: [birth year]   Medicare Important Message Given:  Yes    Kerin Salen 11/14/2017, 10:58 AMImportant Message  Patient Details  Name: DAMONICA CHOPRA MRN: [id number] Date of Birth: 12-03-1951   Medicare Important Message Given:  Yes    Kerin Salen 11/14/2017, 10:58 AM

## 2017-11-15 DIAGNOSIS — M25561 Pain in right knee: Secondary | ICD-10-CM | POA: Diagnosis not present

## 2017-11-18 DIAGNOSIS — M25561 Pain in right knee: Secondary | ICD-10-CM | POA: Diagnosis not present

## 2017-11-20 DIAGNOSIS — M25561 Pain in right knee: Secondary | ICD-10-CM | POA: Diagnosis not present

## 2017-11-26 DIAGNOSIS — M1711 Unilateral primary osteoarthritis, right knee: Secondary | ICD-10-CM | POA: Diagnosis not present

## 2017-11-27 DIAGNOSIS — M25561 Pain in right knee: Secondary | ICD-10-CM | POA: Diagnosis not present

## 2017-11-29 DIAGNOSIS — M25561 Pain in right knee: Secondary | ICD-10-CM | POA: Diagnosis not present

## 2017-12-02 DIAGNOSIS — M25561 Pain in right knee: Secondary | ICD-10-CM | POA: Diagnosis not present

## 2017-12-04 DIAGNOSIS — M25561 Pain in right knee: Secondary | ICD-10-CM | POA: Diagnosis not present

## 2017-12-06 DIAGNOSIS — M25561 Pain in right knee: Secondary | ICD-10-CM | POA: Diagnosis not present

## 2017-12-11 DIAGNOSIS — M25561 Pain in right knee: Secondary | ICD-10-CM | POA: Diagnosis not present

## 2017-12-13 DIAGNOSIS — M25561 Pain in right knee: Secondary | ICD-10-CM | POA: Diagnosis not present

## 2017-12-16 DIAGNOSIS — M25561 Pain in right knee: Secondary | ICD-10-CM | POA: Diagnosis not present

## 2017-12-18 DIAGNOSIS — M25561 Pain in right knee: Secondary | ICD-10-CM | POA: Diagnosis not present

## 2017-12-23 DIAGNOSIS — M25561 Pain in right knee: Secondary | ICD-10-CM | POA: Diagnosis not present

## 2017-12-24 DIAGNOSIS — Z96651 Presence of right artificial knee joint: Secondary | ICD-10-CM | POA: Diagnosis not present

## 2018-01-13 DIAGNOSIS — K219 Gastro-esophageal reflux disease without esophagitis: Secondary | ICD-10-CM | POA: Diagnosis not present

## 2018-03-04 ENCOUNTER — Other Ambulatory Visit: Payer: Self-pay | Admitting: Obstetrics & Gynecology

## 2018-03-04 DIAGNOSIS — Z1231 Encounter for screening mammogram for malignant neoplasm of breast: Secondary | ICD-10-CM

## 2018-03-27 ENCOUNTER — Ambulatory Visit
Admission: RE | Admit: 2018-03-27 | Discharge: 2018-03-27 | Disposition: A | Discharge status: Home / Self Care | Payer: Medicare Other | Source: Ambulatory Visit | Attending: Obstetrics & Gynecology | Admitting: Obstetrics & Gynecology

## 2018-03-27 DIAGNOSIS — Z1231 Encounter for screening mammogram for malignant neoplasm of breast: Secondary | ICD-10-CM | POA: Diagnosis not present

## 2018-04-02 DIAGNOSIS — D1801 Hemangioma of skin and subcutaneous tissue: Secondary | ICD-10-CM | POA: Diagnosis not present

## 2018-04-02 DIAGNOSIS — D485 Neoplasm of uncertain behavior of skin: Secondary | ICD-10-CM | POA: Diagnosis not present

## 2018-04-02 DIAGNOSIS — L905 Scar conditions and fibrosis of skin: Secondary | ICD-10-CM | POA: Diagnosis not present

## 2018-04-02 DIAGNOSIS — D2272 Melanocytic nevi of left lower limb, including hip: Secondary | ICD-10-CM | POA: Diagnosis not present

## 2018-04-02 DIAGNOSIS — D2262 Melanocytic nevi of left upper limb, including shoulder: Secondary | ICD-10-CM | POA: Diagnosis not present

## 2018-04-02 DIAGNOSIS — D2271 Melanocytic nevi of right lower limb, including hip: Secondary | ICD-10-CM | POA: Diagnosis not present

## 2018-05-14 DIAGNOSIS — K219 Gastro-esophageal reflux disease without esophagitis: Secondary | ICD-10-CM | POA: Diagnosis not present

## 2018-05-14 DIAGNOSIS — E782 Mixed hyperlipidemia: Secondary | ICD-10-CM | POA: Diagnosis not present

## 2018-05-14 DIAGNOSIS — R7301 Impaired fasting glucose: Secondary | ICD-10-CM | POA: Diagnosis not present

## 2018-05-14 DIAGNOSIS — N189 Chronic kidney disease, unspecified: Secondary | ICD-10-CM | POA: Diagnosis not present

## 2018-05-14 DIAGNOSIS — Z79899 Other long term (current) drug therapy: Secondary | ICD-10-CM | POA: Diagnosis not present

## 2018-05-18 NOTE — Progress Notes (Signed)
[name] Date of Birth: [birth year] Medical Record #[id number]  History of Present Illness: Seen today for followup hypercholesterolemia. She has a history of hypercholesterolemia and is on Crestor therapy.  She had a normal stress Echo in April 2015. 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   She underwent arthroscopic right knee replacement in November without complication.   On follow up today she is staying quite active. She walks daily and has a Physiological scientist. She does yoga and water aerobics once a week. She does complain of SOB when going up stairs, doing water aerobics, or using elliptical machine. This is really bothering her. She did have extensive pulmonary evaluation in 2016 for chronic cough. PFTs were normal. CT chest was normal except for some coronary calcification. She still has a cough.   Current Outpatient Medications on File Prior to Visit  Medication Sig Dispense Refill  . chlorpheniramine (CHLOR-TRIMETON) 4 MG tablet Take 4 mg by mouth daily as needed for allergies.     . Coenzyme Q10 (CO Q 10 PO) Take 200 mg by mouth daily.    . fexofenadine (ALLEGRA) 180 MG tablet Take 180 mg by mouth daily.    . Multiple Vitamins-Minerals (CENTRUM SILVER PO) Take by mouth.    Marland Kitchen omeprazole (PRILOSEC) 20 MG capsule Take 20 mg by mouth daily.    Marland Kitchen triamcinolone (NASACORT ALLERGY 24HR) 55 MCG/ACT AERO nasal inhaler Place 2 sprays into the nose daily.    Marland Kitchen triamterene-hydrochlorothiazide (MAXZIDE) 75-50 MG per tablet Take 1 tablet by mouth every morning.      No current facility-administered medications on file prior to visit.     Allergies  Allergen Reactions  . Biaxin [Clarithromycin] Hives  . Penicillins Hives    Has patient had a PCN reaction causing immediate rash, facial/tongue/throat swelling, SOB or lightheadedness with hypotension: No Has patient had a PCN reaction causing severe rash involving mucus membranes or skin necrosis: No Has patient had a PCN reaction  that required hospitalization: No Has patient had a PCN reaction occurring within the last 10 years: Yes If all of the above answers are "NO", then may proceed with Cephalosporin use.   . Sulfa Antibiotics Hives  . Zostavax [Zoster Vaccine Live] Hives    Past Medical History:  Diagnosis Date  . Arthritis   . Asthma    allergy related, no attacks  . Atypical chest pain march 2015   april stress test=normal  . 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 Chronic kidney disease   . Edema    maxide prn  . Environmental allergies   . GERD (gastroesophageal reflux disease)   . H/O measles   . H/O mumps   . Hypercholesterolemia   . Peri-menopausal    lexapro prn  . PONV (postoperative nausea and vomiting)     Past Surgical History:  Procedure Laterality Date  . BREAST BIOPSY Right 09/25/2002  . breast bx  09/2002   breast sterootactec biopsy  . BREAST SURGERY Right 10/03   focal atypia hyperplasia  . COLONOSCOPY    . DILATION AND CURETTAGE OF UTERUS     x2  . ENDOMETRIAL BIOPSY  08/2010   proliferative endo  . HYSTEROSCOPY  2007   polyp removed-benign  . PATELLA-FEMORAL ARTHROPLASTY Left 11/23/2013   Procedure: LEFT KNEE PATELLA-FEMORAL ARTHROPLASTY;  Surgeon: Gearlean Alf, MD;  Location: [facility];  Service: Orthopedics;  Laterality: Left;  . POLYPECTOMY  07/2007   hysteroscopic with  removal of polyp Dr. Sabra Heck  . RIGHT OOPHORECTOMY Right 11/91   tubal ligation with complications  . TOTAL KNEE ARTHROPLASTY Left 07/05/2014   Procedure: LEFT TOTAL KNEE ARTHROPLASTY;  Surgeon: Gearlean Alf, MD;  Location: [facility];  Service: Orthopedics;  Laterality: Left;  . TOTAL KNEE ARTHROPLASTY Right 11/11/2017   Procedure: RIGHT TOTAL KNEE ARTHROPLASTY;  Surgeon: Gaynelle Arabian, MD;  Location: [facility];  Service: Orthopedics;  Laterality: Right;  with abductor block  . TUBAL LIGATION  1991  . WISDOM TOOTH EXTRACTION  early 20's    Social History   Tobacco Use  Smoking  Status Never Smoker  Smokeless Tobacco Never Used    Social History   Substance and Sexual Activity  Alcohol Use Yes  . Alcohol/week: 0.6 oz  . Types: 1 Glasses of wine per week   Comment: social    Family History  Problem Relation Age of Onset  . Heart disease Father   . Heart attack Father   . Hypertension Father   . Heart disease Mother   . Hypertension Mother   . Asthma Brother   . Cancer Maternal Grandmother        lymphoma  . Cancer Maternal Grandfather        prostate cancer    Review of Systems: As noted in history of present illness. Positive for cough.  All other systems were reviewed and are negative.  Physical Exam: BP (!) 146/78   Pulse 69   Ht 5\' 3"  (1.6 m)   Wt 179 lb 12.8 oz (81.6 kg)   LMP 10/27/2012   BMI 31.85 kg/m  GENERAL:  Well appearing WF in NAD HEENT:  PERRL, EOMI, sclera are clear. Oropharynx is clear. NECK:  No jugular venous distention, carotid upstroke brisk and symmetric, no bruits, no thyromegaly or adenopathy LUNGS:  Clear to auscultation bilaterally CHEST:  Unremarkable HEART:  RRR,  PMI not displaced or sustained,S1 and S2 within normal limits, no S3, no S4: no clicks, no rubs, no murmurs ABD:  Soft, nontender. BS +, no masses or bruits. No hepatomegaly, no splenomegaly EXT:  2 + pulses throughout, no edema, no cyanosis no clubbing SKIN:  Warm and dry.  No rashes NEURO:  Alert and oriented x 3. Cranial nerves II through XII intact. PSYCH:  Cognitively intact   LABORATORY DATA:  Lab Results  Component Value Date   WBC 11.6 (H) 11/14/2017   HGB 12.2 11/14/2017   HCT 36.6 11/14/2017   PLT 320 11/14/2017   GLUCOSE 104 (H) 11/13/2017   CHOL 174 03/02/2014   TRIG 144.0 03/02/2014   HDL 60.90 03/02/2014   LDLCALC 84 03/02/2014   ALT 29 11/04/2017   AST 23 11/04/2017   NA 139 11/13/2017   K 3.2 (L) 11/13/2017   CL 103 11/13/2017   CREATININE 0.89 11/13/2017   BUN 16 11/13/2017   CO2 28 11/13/2017   INR 0.94 11/04/2017    Labs dated 05/01/17 reviewed: Normal  Chemistry panel and TSH. Cholesterol- 176, trig- 129, HDL- 61, LDL 89.  Dated 05/14/18: cholesterol 196, triglycerides 158, HDL 65, LDL 100. Chemistries and CBC normal.  Ecg today shows NSR with rate 69. Nonspecific TWA in the precordial leads. I have personally reviewed and interpreted this study.  Assessment / Plan: 1. Hypercholesterolemia. Given coronary calcification I would like to see her LDL lower. Will increase crestor to 10 mg daily. Repeat lab in 3 months.  Encourage continued  lifestyle modifications.   2. Dyspnea on exertion.  Need to repeat ischemic evaluation. Discussed options of repeat stress Echo versus coronary CTA. Will proceed with stress Echo

## 2018-05-21 ENCOUNTER — Encounter: Payer: Self-pay | Admitting: Cardiology

## 2018-05-21 ENCOUNTER — Ambulatory Visit (INDEPENDENT_AMBULATORY_CARE_PROVIDER_SITE_OTHER): Payer: Medicare Other | Admitting: Cardiology

## 2018-05-21 VITALS — BP 146/78 | HR 69 | Ht 63.0 in | Wt 179.8 lb

## 2018-05-21 DIAGNOSIS — E7800 Pure hypercholesterolemia, unspecified: Secondary | ICD-10-CM | POA: Diagnosis not present

## 2018-05-21 DIAGNOSIS — R0600 Dyspnea, unspecified: Secondary | ICD-10-CM

## 2018-05-21 DIAGNOSIS — R0609 Other forms of dyspnea: Secondary | ICD-10-CM

## 2018-05-21 MED ORDER — ROSUVASTATIN CALCIUM 10 MG PO TABS: 10.0000 mg | ORAL_TABLET | Freq: Every day | ORAL | 3 refills | Status: DC

## 2018-05-21 NOTE — Patient Instructions (Signed)
We will schedule you for a stress Echo  Increase crestor to 10 mg daily  Repeat lab in 3 months

## 2018-05-21 NOTE — Addendum Note (Signed)
Addended by: Kathyrn Lass on: 05/21/2018 08:44 AM   Modules accepted: Orders

## 2018-05-23 ENCOUNTER — Telehealth (HOSPITAL_COMMUNITY): Payer: Self-pay | Admitting: *Deleted

## 2018-05-23 NOTE — Telephone Encounter (Signed)
Left message on voicemail per DPR in reference to upcoming appointment scheduled on 05/28/18 at 0730 with detailed instructions given per Stress Test Requisition Sheet for the test. LM to arrive 30 minutes early, and that it is imperative to arrive on time for appointment to keep from having the test rescheduled. If you need to cancel or reschedule your appointment, please call the office within 24 hours of your appointment. Failure to do so may result in a cancellation of your appointment, and a $50 no show fee. Phone number given for call back for any questions. Codee Bloodworth, Ranae [place]ms

## 2018-05-27 DIAGNOSIS — H52223 Regular astigmatism, bilateral: Secondary | ICD-10-CM | POA: Diagnosis not present

## 2018-05-27 DIAGNOSIS — H2513 Age-related nuclear cataract, bilateral: Secondary | ICD-10-CM | POA: Diagnosis not present

## 2018-05-27 DIAGNOSIS — H04123 Dry eye syndrome of bilateral lacrimal glands: Secondary | ICD-10-CM | POA: Diagnosis not present

## 2018-05-27 DIAGNOSIS — H43811 Vitreous degeneration, right eye: Secondary | ICD-10-CM | POA: Diagnosis not present

## 2018-05-27 DIAGNOSIS — H11153 Pinguecula, bilateral: Secondary | ICD-10-CM | POA: Diagnosis not present

## 2018-05-27 DIAGNOSIS — H5213 Myopia, bilateral: Secondary | ICD-10-CM | POA: Diagnosis not present

## 2018-05-27 DIAGNOSIS — H524 Presbyopia: Secondary | ICD-10-CM | POA: Diagnosis not present

## 2018-05-28 ENCOUNTER — Ambulatory Visit (HOSPITAL_COMMUNITY): Payer: Medicare Other

## 2018-05-28 ENCOUNTER — Ambulatory Visit (HOSPITAL_COMMUNITY): Payer: Medicare Other | Attending: Cardiology

## 2018-05-28 DIAGNOSIS — R609 Edema, unspecified: Secondary | ICD-10-CM | POA: Insufficient documentation

## 2018-05-28 DIAGNOSIS — E7800 Pure hypercholesterolemia, unspecified: Secondary | ICD-10-CM

## 2018-05-28 DIAGNOSIS — R0609 Other forms of dyspnea: Secondary | ICD-10-CM | POA: Diagnosis not present

## 2018-05-28 DIAGNOSIS — R079 Chest pain, unspecified: Secondary | ICD-10-CM | POA: Insufficient documentation

## 2018-05-28 DIAGNOSIS — R0600 Dyspnea, unspecified: Secondary | ICD-10-CM

## 2018-05-28 DIAGNOSIS — J45909 Unspecified asthma, uncomplicated: Secondary | ICD-10-CM | POA: Insufficient documentation

## 2018-05-28 DIAGNOSIS — M1990 Unspecified osteoarthritis, unspecified site: Secondary | ICD-10-CM | POA: Diagnosis not present

## 2018-08-11 DIAGNOSIS — K219 Gastro-esophageal reflux disease without esophagitis: Secondary | ICD-10-CM | POA: Diagnosis not present

## 2018-08-11 MED FILL — OMEPRAZOLE 40 MG CPDR: 40 | 90 days supply | Qty: 180 | Fill #0

## 2018-09-02 DIAGNOSIS — R0609 Other forms of dyspnea: Secondary | ICD-10-CM | POA: Diagnosis not present

## 2018-09-02 DIAGNOSIS — E7800 Pure hypercholesterolemia, unspecified: Secondary | ICD-10-CM | POA: Diagnosis not present

## 2018-09-02 LAB — LIPID PANEL W/O CHOL/HDL RATIO
CHOLESTEROL TOTAL: 158 mg/dL (ref 100–199)
HDL: 59 mg/dL (ref >39)
LDL CALC: 71 mg/dL (ref 0–99)
Triglycerides: 139 mg/dL (ref 0–149)
VLDL Cholesterol Cal: 28 mg/dL (ref 5–40)

## 2018-09-02 LAB — HEPATIC FUNCTION PANEL
ALK PHOS: 92 IU/L (ref 39–117)
ALT: 28 IU/L (ref 0–32)
AST: 19 IU/L (ref 0–40)
Albumin: 4.5 g/dL (ref 3.6–4.8)
BILIRUBIN TOTAL: 0.3 mg/dL (ref 0.0–1.2)
BILIRUBIN, DIRECT: 0.09 mg/dL (ref 0.00–0.40)
TOTAL PROTEIN: 6.6 g/dL (ref 6.0–8.5)

## 2018-09-08 DIAGNOSIS — K219 Gastro-esophageal reflux disease without esophagitis: Secondary | ICD-10-CM | POA: Diagnosis not present

## 2018-09-08 DIAGNOSIS — R05 Cough: Secondary | ICD-10-CM | POA: Diagnosis not present

## 2018-09-25 ENCOUNTER — Encounter: Payer: Self-pay | Admitting: Obstetrics & Gynecology

## 2018-09-25 ENCOUNTER — Ambulatory Visit (INDEPENDENT_AMBULATORY_CARE_PROVIDER_SITE_OTHER): Payer: Medicare Other | Admitting: Obstetrics & Gynecology

## 2018-09-25 VITALS — BP 126/64 | HR 92 | Resp 14 | Ht 63.0 in | Wt 181.6 lb

## 2018-09-25 DIAGNOSIS — N181 Chronic kidney disease, stage 1: Secondary | ICD-10-CM

## 2018-09-25 DIAGNOSIS — Z01419 Encounter for gynecological examination (general) (routine) without abnormal findings: Secondary | ICD-10-CM

## 2018-09-25 DIAGNOSIS — Z124 Encounter for screening for malignant neoplasm of cervix: Secondary | ICD-10-CM | POA: Diagnosis not present

## 2018-09-25 NOTE — Progress Notes (Signed)
67 y.o. [id number] Married White or Caucasian female here for annual exam.  Doing well.  Had right knee replacement last November.  Denies vaginal bleeding.    Has not had a flu shot yet.  Having URI symptoms this week.    PCP:  Dr. Rex Kras at [facility] on [address].  He is considering retirement.  Lab work was done in May.    Cardiologist:  Dr Martinique.  Typically sees Dr. Martinique yearly.  Crestor is now at 10mg  daily.    Patient's last menstrual period was 10/27/2012.          Sexually active: No.  The current method of family planning is post menopausal status.    Exercising: Yes.    walking, yoga, strength  Smoker:  no  Health Maintenance: Pap:  07/22/17 Neg   07/05/15 Neg. HR HPV:neg  History of abnormal Pap:  no MMG:  03/27/18 BIRADS1:Neg  Colonoscopy:  2012 f/u 10 years  BMD:   01/25/16 Normal  TDaP:  2017 Pneumonia vaccine(s):  done Shingrix:   Completed  Hep C testing: PCP Screening Labs: PCP   reports that she has never smoked. She has never used smokeless tobacco. She reports that she drinks about 1.0 standard drinks of alcohol per week. She reports that she does not use drugs.  Past Medical History:  Diagnosis Date  . Arthritis   . Asthma    allergy related, no attacks  . Atypical chest pain march 2015   april stress test=normal  . 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 Chronic kidney disease    GFR 58, mild  . Edema    maxide prn  . Environmental allergies   . GERD (gastroesophageal reflux disease)   . H/O measles   . H/O mumps   . Hypercholesterolemia   . PONV (postoperative nausea and vomiting)     Past Surgical History:  Procedure Laterality Date  . BREAST BIOPSY Right 09/2002   focal atypical lobular hyperplasia  . BREAST SURGERY Right 10/03   focal atypia hyperplasia  . DILATION AND CURETTAGE OF UTERUS     x2  . HYSTEROSCOPY W/D&C  07/2007   polyp removed-benign  . PATELLA-FEMORAL ARTHROPLASTY Left 11/23/2013   Procedure: LEFT KNEE  PATELLA-FEMORAL ARTHROPLASTY;  Surgeon: Gearlean Alf, MD;  Location: [facility];  Service: Orthopedics;  Laterality: Left;  . RIGHT OOPHORECTOMY Right 11/91   tubal ligation with complications  . TOTAL KNEE ARTHROPLASTY Left 07/05/2014   Procedure: LEFT TOTAL KNEE ARTHROPLASTY;  Surgeon: Gearlean Alf, MD;  [facility]: [facility];  Service: Orthopedics;  Laterality: Left;  . TOTAL KNEE ARTHROPLASTY Right 11/11/2017   Procedure: RIGHT TOTAL KNEE ARTHROPLASTY;  Surgeon: Gaynelle Arabian, MD;  Location: [facility];  Service: Orthopedics;  Laterality: Right;  with abductor block  . TUBAL LIGATION  1991  . WISDOM TOOTH EXTRACTION  early 20's    Current Outpatient Medications  Medication Sig Dispense Refill  . chlorpheniramine (CHLOR-TRIMETON) 4 MG tablet Take 4 mg by mouth daily as needed for allergies.     . cholecalciferol (VITAMIN D) 1000 units tablet Take 1,000 Units by mouth daily.    . Coenzyme Q10 (CO Q 10 PO) Take 200 mg by mouth daily.    . fexofenadine (ALLEGRA) 180 MG tablet Take 180 mg by mouth daily.    . Melatonin 1 MG CAPS daily as needed.    . Multiple Vitamins-Minerals (CENTRUM SILVER PO) Take by mouth.    Marland Kitchen omeprazole (  PRILOSEC) 20 MG capsule Take 20 mg by mouth daily.    . rosuvastatin (CRESTOR) 10 MG tablet Take 1 tablet (10 mg total) by mouth daily. 90 tablet 3  . triamcinolone (NASACORT ALLERGY 24HR) 55 MCG/ACT AERO nasal inhaler Place 2 sprays into the nose daily.    Marland Kitchen triamterene-hydrochlorothiazide (MAXZIDE) 75-50 MG per tablet Take 1 tablet by mouth every morning.     . vitamin E 400 UNIT capsule Take 400 Units by mouth daily.     No current facility-administered medications for this visit.     Family History  Problem Relation Age of Onset  . Heart disease Father   . Heart attack Father   . Hypertension Father   . Heart disease Mother   . Hypertension Mother   . Asthma Brother   . Cancer Maternal Grandmother        lymphoma  . Cancer Maternal Grandfather         prostate cancer    Review of Systems  HENT: Positive for congestion.   All other systems reviewed and are negative.   Exam:   BP 126/64 (BP Location: Right Arm, Patient Position: Sitting, Cuff Size: Large)   Pulse 92   Resp 14   Ht 5\' 3"  (1.6 m)   Wt 181 lb 9.6 oz (82.4 kg)   LMP 10/27/2012   BMI 32.17 kg/m   Height: 5\' 3"  (160 cm)  Ht Readings from Last 3 Encounters:  09/25/18 5\' 3"  (1.6 m)  05/21/18 5\' 3"  (1.6 m)  11/11/17 5\' 3"  (1.6 m)    General appearance: alert, cooperative and appears stated age Head: Normocephalic, without obvious abnormality, atraumatic Neck: no adenopathy, supple, symmetrical, trachea midline and thyroid normal to inspection and palpation Lungs: clear to auscultation bilaterally Breasts: normal appearance, no masses or tenderness, well healed right breast scar Heart: regular rate and rhythm Abdomen: soft, non-tender; bowel sounds normal; no masses,  no organomegaly Extremities: extremities normal, atraumatic, no cyanosis or edema Skin: Skin color, texture, turgor normal. No rashes or lesions Lymph nodes: Cervical, supraclavicular, and axillary nodes normal. No abnormal inguinal nodes palpated Neurologic: Grossly normal   Pelvic: External genitalia:  no lesions              Urethra:  normal appearing urethra with no masses, tenderness or lesions              Bartholins and Skenes: normal                 Vagina: normal appearing vagina with normal color and discharge, no lesions              Cervix: no lesions              Pap taken: No. Bimanual Exam:  Uterus:  normal size, contour, position, consistency, mobility, non-tender              Adnexa: normal adnexa and no mass, fullness, tenderness               Rectovaginal: Confirms               Anus:  normal sphincter tone, no lesions  Chaperone was present for exam.  A:  Well Woman with normal exam PMP, no HRT H/O BTL, then right oophorectomy 1991 H/O atypical lobular hyperplasia (right  breast) s/p lumpectomy Hypercholesteremia  Elevated lipids Mildly decreased GFR, having yearly lab work  P:   Mammogram guidelines reviewed pap smear negative 2018, neg pap  and neg HR HPV 2016.  Repeat next year with HR HPV Blood work UTD with Dr. Rex Kras and Dr. Martinique Vaccines UTD--has completed the Shingrix vaccination return annually or prn

## 2018-09-26 ENCOUNTER — Encounter: Payer: Self-pay | Admitting: Obstetrics & Gynecology

## 2018-09-26 DIAGNOSIS — N189 Chronic kidney disease, unspecified: Secondary | ICD-10-CM | POA: Insufficient documentation

## 2018-10-07 DIAGNOSIS — Z23 Encounter for immunization: Secondary | ICD-10-CM | POA: Diagnosis not present

## 2018-10-27 MED FILL — OMEPRAZOLE 40 MG CPDR: 40 | 90 days supply | Qty: 180 | Fill #1

## 2018-11-20 DIAGNOSIS — Z96651 Presence of right artificial knee joint: Secondary | ICD-10-CM | POA: Diagnosis not present

## 2018-11-20 DIAGNOSIS — Z471 Aftercare following joint replacement surgery: Secondary | ICD-10-CM | POA: Diagnosis not present

## 2018-11-25 DIAGNOSIS — K219 Gastro-esophageal reflux disease without esophagitis: Secondary | ICD-10-CM | POA: Diagnosis not present

## 2018-11-25 DIAGNOSIS — R05 Cough: Secondary | ICD-10-CM | POA: Diagnosis not present

## 2018-12-01 DIAGNOSIS — D171 Benign lipomatous neoplasm of skin and subcutaneous tissue of trunk: Secondary | ICD-10-CM | POA: Diagnosis not present

## 2018-12-05 DIAGNOSIS — E669 Obesity, unspecified: Secondary | ICD-10-CM | POA: Diagnosis not present

## 2018-12-05 DIAGNOSIS — E559 Vitamin D deficiency, unspecified: Secondary | ICD-10-CM | POA: Diagnosis not present

## 2018-12-05 DIAGNOSIS — Z79899 Other long term (current) drug therapy: Secondary | ICD-10-CM | POA: Diagnosis not present

## 2018-12-05 DIAGNOSIS — I10 Essential (primary) hypertension: Secondary | ICD-10-CM | POA: Diagnosis not present

## 2018-12-05 DIAGNOSIS — M8580 Other specified disorders of bone density and structure, unspecified site: Secondary | ICD-10-CM | POA: Diagnosis not present

## 2018-12-05 DIAGNOSIS — R7301 Impaired fasting glucose: Secondary | ICD-10-CM | POA: Diagnosis not present

## 2018-12-05 DIAGNOSIS — Z6833 Body mass index (BMI) 33.0-33.9, adult: Secondary | ICD-10-CM | POA: Diagnosis not present

## 2018-12-23 ENCOUNTER — Ambulatory Visit (INDEPENDENT_AMBULATORY_CARE_PROVIDER_SITE_OTHER): Payer: Medicare Other | Admitting: Plastic Surgery

## 2018-12-23 ENCOUNTER — Encounter: Payer: Self-pay | Admitting: Plastic Surgery

## 2018-12-23 VITALS — BP 152/91 | Temp 97.6°F | Ht 63.0 in | Wt 190.0 lb

## 2018-12-23 DIAGNOSIS — D171 Benign lipomatous neoplasm of skin and subcutaneous tissue of trunk: Secondary | ICD-10-CM

## 2018-12-23 DIAGNOSIS — N181 Chronic kidney disease, stage 1: Secondary | ICD-10-CM

## 2018-12-23 NOTE — H&P (View-Only) (Signed)
Patient ID: Diane Fox, female    DOB: 08-22-1951, 68 y.o.   MRN: [id number]   Chief Complaint  Patient presents with  . Skin Problem    The patient is a 68 year old white female here for evaluation of a mass on her right posterior trunk.  She is not sure how long its been there but at least a couple of months.  It is 5 x 5 cm in size.  It is soft and feels like adipose.  It is slightly movable.  It is not painful.  She has noticed that it is getting bigger.  It does not seem to be inhibiting any range of motion or activities.  She has a history of small lipomas on other parts of her body particularly on her arms.  She has chronic kidney disease and hyperlipidemia.  She is 5 feet 3 inches tall.  Her weight is 190 pounds.  Nothing makes it better.   Review of Systems  Constitutional: Negative for activity change and appetite change.  HENT: Negative.   Eyes: Negative.   Respiratory: Negative.   Cardiovascular: Negative.   Gastrointestinal: Negative.   Endocrine: Negative.   Genitourinary: Negative.   Musculoskeletal: Negative.   Skin: Negative for color change, pallor, rash and wound.  Neurological: Negative.   Psychiatric/Behavioral: Negative.     Past Medical History:  Diagnosis Date  . Arthritis   . Asthma    allergy related, no attacks  . Atypical chest pain march 2015   april stress test=normal  . 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 Chronic kidney disease    GFR 58, mild  . Edema    maxide prn  . Environmental allergies   . GERD (gastroesophageal reflux disease)   . H/O measles   . H/O mumps   . Hypercholesterolemia   . PONV (postoperative nausea and vomiting)     Past Surgical History:  Procedure Laterality Date  . BREAST BIOPSY Right 09/2002   focal atypical lobular hyperplasia  . BREAST SURGERY Right 10/03   focal atypia hyperplasia  . DILATION AND CURETTAGE OF UTERUS     x2  . HYSTEROSCOPY W/D&C  07/2007   polyp  removed-benign  . PATELLA-FEMORAL ARTHROPLASTY Left 11/23/2013   Procedure: LEFT KNEE PATELLA-FEMORAL ARTHROPLASTY;  Surgeon: Gearlean Alf, MD;  Location: [facility];  Service: Orthopedics;  Laterality: Left;  . RIGHT OOPHORECTOMY Right 11/91   tubal ligation with complications  . TOTAL KNEE ARTHROPLASTY Left 07/05/2014   Procedure: LEFT TOTAL KNEE ARTHROPLASTY;  Surgeon: Gearlean Alf, MD;  Location: [facility];  Service: Orthopedics;  Laterality: Left;  . TOTAL KNEE ARTHROPLASTY Right 11/11/2017   Procedure: RIGHT TOTAL KNEE ARTHROPLASTY;  Surgeon: Gaynelle Arabian, MD;  Location: [facility];  Service: Orthopedics;  Laterality: Right;  with abductor block  . TUBAL LIGATION  1991  . WISDOM TOOTH EXTRACTION  early 20's      Current Outpatient Medications:  .  chlorpheniramine (CHLOR-TRIMETON) 4 MG tablet, Take 4 mg by mouth daily as needed for allergies. , Disp: , Rfl:  .  cholecalciferol (VITAMIN D) 1000 units tablet, Take 1,000 Units by mouth daily., Disp: , Rfl:  .  Coenzyme Q10 (CO Q 10 PO), Take 200 mg by mouth daily., Disp: , Rfl:  .  fexofenadine (ALLEGRA) 180 MG tablet, Take 180 mg by mouth daily., Disp: , Rfl:  .  Melatonin 1 MG CAPS, daily as needed., Disp: , Rfl:  .  Multiple Vitamins-Minerals (CENTRUM SILVER PO), Take by mouth., Disp: , Rfl:  .  omeprazole (PRILOSEC) 20 MG capsule, Take 20 mg by mouth daily., Disp: , Rfl:  .  rosuvastatin (CRESTOR) 10 MG tablet, Take 1 tablet (10 mg total) by mouth daily., Disp: 90 tablet, Rfl: 3 .  triamcinolone (NASACORT ALLERGY 24HR) 55 MCG/ACT AERO nasal inhaler, Place 2 sprays into the nose daily., Disp: , Rfl:  .  triamterene-hydrochlorothiazide (MAXZIDE) 75-50 MG per tablet, Take 1 tablet by mouth every morning. , Disp: , Rfl:  .  vitamin E 400 UNIT capsule, Take 400 Units by mouth daily., Disp: , Rfl:    Objective:   Vitals:   12/23/18 0808  BP: (!) 152/91  Temp: 97.6 F (36.4 C)  SpO2: 98%    Physical Exam Vitals signs and nursing note  reviewed.  Constitutional:      Appearance: Normal appearance.  HENT:     Head: Normocephalic and atraumatic.     Nose: Nose normal.     Mouth/Throat:     Mouth: Mucous membranes are moist.  Cardiovascular:     Rate and Rhythm: Normal rate.  Pulmonary:     Effort: Pulmonary effort is normal.  Abdominal:     General: Abdomen is flat.  Musculoskeletal:       Arms:  Neurological:     General: No focal deficit present.     Mental Status: She is alert.  Psychiatric:        Mood and Affect: Mood normal.        Thought Content: Thought content normal.        Judgment: Judgment normal.     Assessment & Plan:  Stage 1 chronic kidney disease  Lipoma of back Recommend excision of back mass most likely a lipoma.  Will plan on sending for pathologic evaluation.  I also discussed the importance of decreasing sugar and carbohydrates in her diet with a moderate increase in protein for healing purposes after the surgery.  I have requested her to take a multivitamin and 500 mg of vitamin C daily.  Pictures were taken per protocol.  [place], DO

## 2018-12-23 NOTE — Progress Notes (Signed)
Patient ID: Diane Fox, female    DOB: 05/07/1951, 68 y.o.   MRN: [id number]   Chief Complaint  Patient presents with  . Skin Problem    The patient is a 68 year old white female here for evaluation of a mass on her right posterior trunk.  She is not sure how long its been there but at least a couple of months.  It is 5 x 5 cm in size.  It is soft and feels like adipose.  It is slightly movable.  It is not painful.  She has noticed that it is getting bigger.  It does not seem to be inhibiting any range of motion or activities.  She has a history of small lipomas on other parts of her body particularly on her arms.  She has chronic kidney disease and hyperlipidemia.  She is 5 feet 3 inches tall.  Her weight is 190 pounds.  Nothing makes it better.   Review of Systems  Constitutional: Negative for activity change and appetite change.  HENT: Negative.   Eyes: Negative.   Respiratory: Negative.   Cardiovascular: Negative.   Gastrointestinal: Negative.   Endocrine: Negative.   Genitourinary: Negative.   Musculoskeletal: Negative.   Skin: Negative for color change, pallor, rash and wound.  Neurological: Negative.   Psychiatric/Behavioral: Negative.     Past Medical History:  Diagnosis Date  . Arthritis   . Asthma    allergy related, no attacks  . Atypical chest pain march 2015   april stress test=normal  . 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 Chronic kidney disease    GFR 58, mild  . Edema    maxide prn  . Environmental allergies   . GERD (gastroesophageal reflux disease)   . H/O measles   . H/O mumps   . Hypercholesterolemia   . PONV (postoperative nausea and vomiting)     Past Surgical History:  Procedure Laterality Date  . BREAST BIOPSY Right 09/2002   focal atypical lobular hyperplasia  . BREAST SURGERY Right 10/03   focal atypia hyperplasia  . DILATION AND CURETTAGE OF UTERUS     x2  . HYSTEROSCOPY W/D&C  07/2007   polyp  removed-benign  . PATELLA-FEMORAL ARTHROPLASTY Left 11/23/2013   Procedure: LEFT KNEE PATELLA-FEMORAL ARTHROPLASTY;  Surgeon: Gearlean Alf, MD;  Location: [facility];  Service: Orthopedics;  Laterality: Left;  . RIGHT OOPHORECTOMY Right 11/91   tubal ligation with complications  . TOTAL KNEE ARTHROPLASTY Left 07/05/2014   Procedure: LEFT TOTAL KNEE ARTHROPLASTY;  Surgeon: Gearlean Alf, MD;  Location: [facility];  Service: Orthopedics;  Laterality: Left;  . TOTAL KNEE ARTHROPLASTY Right 11/11/2017   Procedure: RIGHT TOTAL KNEE ARTHROPLASTY;  Surgeon: Gaynelle Arabian, MD;  Location: [facility];  Service: Orthopedics;  Laterality: Right;  with abductor block  . TUBAL LIGATION  1991  . WISDOM TOOTH EXTRACTION  early 20's      Current Outpatient Medications:  .  chlorpheniramine (CHLOR-TRIMETON) 4 MG tablet, Take 4 mg by mouth daily as needed for allergies. , Disp: , Rfl:  .  cholecalciferol (VITAMIN D) 1000 units tablet, Take 1,000 Units by mouth daily., Disp: , Rfl:  .  Coenzyme Q10 (CO Q 10 PO), Take 200 mg by mouth daily., Disp: , Rfl:  .  fexofenadine (ALLEGRA) 180 MG tablet, Take 180 mg by mouth daily., Disp: , Rfl:  .  Melatonin 1 MG CAPS, daily as needed., Disp: , Rfl:  .  Multiple Vitamins-Minerals (CENTRUM SILVER PO), Take by mouth., Disp: , Rfl:  .  omeprazole (PRILOSEC) 20 MG capsule, Take 20 mg by mouth daily., Disp: , Rfl:  .  rosuvastatin (CRESTOR) 10 MG tablet, Take 1 tablet (10 mg total) by mouth daily., Disp: 90 tablet, Rfl: 3 .  triamcinolone (NASACORT ALLERGY 24HR) 55 MCG/ACT AERO nasal inhaler, Place 2 sprays into the nose daily., Disp: , Rfl:  .  triamterene-hydrochlorothiazide (MAXZIDE) 75-50 MG per tablet, Take 1 tablet by mouth every morning. , Disp: , Rfl:  .  vitamin E 400 UNIT capsule, Take 400 Units by mouth daily., Disp: , Rfl:    Objective:   Vitals:   12/23/18 0808  BP: (!) 152/91  Temp: 97.6 F (36.4 C)  SpO2: 98%    Physical Exam Vitals signs and nursing note  reviewed.  Constitutional:      Appearance: Normal appearance.  HENT:     Head: Normocephalic and atraumatic.     Nose: Nose normal.     Mouth/Throat:     Mouth: Mucous membranes are moist.  Cardiovascular:     Rate and Rhythm: Normal rate.  Pulmonary:     Effort: Pulmonary effort is normal.  Abdominal:     General: Abdomen is flat.  Musculoskeletal:       Arms:  Neurological:     General: No focal deficit present.     Mental Status: She is alert.  Psychiatric:        Mood and Affect: Mood normal.        Thought Content: Thought content normal.        Judgment: Judgment normal.     Assessment & Plan:  Stage 1 chronic kidney disease  Lipoma of back Recommend excision of back mass most likely a lipoma.  Will plan on sending for pathologic evaluation.  I also discussed the importance of decreasing sugar and carbohydrates in her diet with a moderate increase in protein for healing purposes after the surgery.  I have requested her to take a multivitamin and 500 mg of vitamin C daily.  Pictures were taken per protocol.  [place], DO

## 2019-01-07 ENCOUNTER — Other Ambulatory Visit: Payer: Self-pay

## 2019-01-07 ENCOUNTER — Encounter (HOSPITAL_BASED_OUTPATIENT_CLINIC_OR_DEPARTMENT_OTHER): Payer: Self-pay | Admitting: *Deleted

## 2019-01-12 ENCOUNTER — Encounter (HOSPITAL_BASED_OUTPATIENT_CLINIC_OR_DEPARTMENT_OTHER)
Admission: RE | Admit: 2019-01-12 | Discharge: 2019-01-12 | Disposition: A | Discharge status: Home / Self Care | Payer: Medicare Other | Source: Ambulatory Visit | Attending: Plastic Surgery | Admitting: Plastic Surgery

## 2019-01-12 DIAGNOSIS — K219 Gastro-esophageal reflux disease without esophagitis: Secondary | ICD-10-CM | POA: Diagnosis not present

## 2019-01-12 DIAGNOSIS — Z7951 Long term (current) use of inhaled steroids: Secondary | ICD-10-CM | POA: Diagnosis not present

## 2019-01-12 DIAGNOSIS — Z79899 Other long term (current) drug therapy: Secondary | ICD-10-CM | POA: Diagnosis not present

## 2019-01-12 DIAGNOSIS — Z01812 Encounter for preprocedural laboratory examination: Secondary | ICD-10-CM

## 2019-01-12 DIAGNOSIS — D171 Benign lipomatous neoplasm of skin and subcutaneous tissue of trunk: Secondary | ICD-10-CM | POA: Diagnosis not present

## 2019-01-12 DIAGNOSIS — Z96653 Presence of artificial knee joint, bilateral: Secondary | ICD-10-CM | POA: Diagnosis not present

## 2019-01-12 DIAGNOSIS — E7800 Pure hypercholesterolemia, unspecified: Secondary | ICD-10-CM | POA: Diagnosis not present

## 2019-01-12 DIAGNOSIS — N181 Chronic kidney disease, stage 1: Secondary | ICD-10-CM | POA: Diagnosis not present

## 2019-01-12 DIAGNOSIS — E785 Hyperlipidemia, unspecified: Secondary | ICD-10-CM | POA: Diagnosis not present

## 2019-01-12 LAB — BASIC METABOLIC PANEL
Anion gap: 10 (ref 5–15)
BUN: 19 mg/dL (ref 8–23)
CO2: 26 mmol/L (ref 22–32)
CREATININE: 0.96 mg/dL (ref 0.44–1.00)
Calcium: 10.6 mg/dL — ABNORMAL HIGH (ref 8.9–10.3)
Chloride: 101 mmol/L (ref 98–111)
GFR calc Af Amer: >60 mL/min (ref >60)
GFR calc non Af Amer: >60 mL/min (ref >60)
Glucose, Bld: 95 mg/dL (ref 70–99)
Potassium: 3.9 mmol/L (ref 3.5–5.1)
Sodium: 137 mmol/L (ref 135–145)

## 2019-01-15 ENCOUNTER — Encounter (HOSPITAL_BASED_OUTPATIENT_CLINIC_OR_DEPARTMENT_OTHER): Payer: Self-pay

## 2019-01-15 ENCOUNTER — Ambulatory Visit (HOSPITAL_BASED_OUTPATIENT_CLINIC_OR_DEPARTMENT_OTHER)
Admission: RE | Admit: 2019-01-15 | Discharge: 2019-01-15 | Disposition: A | Discharge status: Home / Self Care | Payer: Medicare Other | Attending: Plastic Surgery | Admitting: Plastic Surgery

## 2019-01-15 ENCOUNTER — Ambulatory Visit (HOSPITAL_BASED_OUTPATIENT_CLINIC_OR_DEPARTMENT_OTHER): Payer: Medicare Other | Admitting: Anesthesiology

## 2019-01-15 ENCOUNTER — Other Ambulatory Visit: Payer: Self-pay

## 2019-01-15 ENCOUNTER — Encounter (HOSPITAL_BASED_OUTPATIENT_CLINIC_OR_DEPARTMENT_OTHER)
Admission: RE | Disposition: A | Discharge status: Home / Self Care | Payer: Self-pay | Source: Home / Self Care | Attending: Plastic Surgery

## 2019-01-15 DIAGNOSIS — K219 Gastro-esophageal reflux disease without esophagitis: Secondary | ICD-10-CM | POA: Insufficient documentation

## 2019-01-15 DIAGNOSIS — E7800 Pure hypercholesterolemia, unspecified: Secondary | ICD-10-CM | POA: Diagnosis not present

## 2019-01-15 DIAGNOSIS — N181 Chronic kidney disease, stage 1: Secondary | ICD-10-CM | POA: Diagnosis not present

## 2019-01-15 DIAGNOSIS — Z79899 Other long term (current) drug therapy: Secondary | ICD-10-CM | POA: Insufficient documentation

## 2019-01-15 DIAGNOSIS — E785 Hyperlipidemia, unspecified: Secondary | ICD-10-CM | POA: Diagnosis not present

## 2019-01-15 DIAGNOSIS — Z96653 Presence of artificial knee joint, bilateral: Secondary | ICD-10-CM | POA: Diagnosis not present

## 2019-01-15 DIAGNOSIS — D1721 Benign lipomatous neoplasm of skin and subcutaneous tissue of right arm: Secondary | ICD-10-CM | POA: Diagnosis not present

## 2019-01-15 DIAGNOSIS — Z7951 Long term (current) use of inhaled steroids: Secondary | ICD-10-CM | POA: Insufficient documentation

## 2019-01-15 DIAGNOSIS — D171 Benign lipomatous neoplasm of skin and subcutaneous tissue of trunk: Secondary | ICD-10-CM | POA: Diagnosis not present

## 2019-01-15 HISTORY — DX: Dyspnea, unspecified: R06.00

## 2019-01-15 HISTORY — PX: LIPOMA EXCISION: SHX5283

## 2019-01-15 SURGERY — EXCISION LIPOMA
Anesthesia: General | Site: Back | Laterality: Right

## 2019-01-15 MED ORDER — MIDAZOLAM HCL 2 MG/2ML IJ SOLN
1.0000 mg | INTRAMUSCULAR | Status: DC | PRN
  Administered 2019-01-15: 2 mg via INTRAVENOUS

## 2019-01-15 MED ORDER — CIPROFLOXACIN IN D5W 400 MG/200ML IV SOLN
400.0000 mg | INTRAVENOUS | Status: AC
  Administered 2019-01-15: 400 mg via INTRAVENOUS

## 2019-01-15 MED ORDER — ONDANSETRON HCL 4 MG/2ML IJ SOLN
INTRAMUSCULAR | Status: DC | PRN
  Administered 2019-01-15: 4 mg via INTRAVENOUS

## 2019-01-15 MED ORDER — LIDOCAINE-EPINEPHRINE 1 %-1:100000 IJ SOLN
INTRAMUSCULAR | Status: DC | PRN
  Administered 2019-01-15: 2 mL

## 2019-01-15 MED ORDER — CIPROFLOXACIN IN D5W 400 MG/200ML IV SOLN
INTRAVENOUS | Status: AC
  Filled 2019-01-15: qty 200

## 2019-01-15 MED ORDER — DEXAMETHASONE SODIUM PHOSPHATE 10 MG/ML IJ SOLN
INTRAMUSCULAR | Status: AC
  Filled 2019-01-15: qty 1

## 2019-01-15 MED ORDER — PHENYLEPHRINE 40 MCG/ML (10ML) SYRINGE FOR IV PUSH (FOR BLOOD PRESSURE SUPPORT)
PREFILLED_SYRINGE | INTRAVENOUS | Status: AC
  Filled 2019-01-15: qty 10

## 2019-01-15 MED ORDER — SCOPOLAMINE 1 MG/3DAYS TD PT72: 1.0000 | MEDICATED_PATCH | Freq: Once | TRANSDERMAL | Status: DC | PRN

## 2019-01-15 MED ORDER — FENTANYL CITRATE (PF) 100 MCG/2ML IJ SOLN
INTRAMUSCULAR | Status: AC
  Filled 2019-01-15: qty 2

## 2019-01-15 MED ORDER — ONDANSETRON HCL 4 MG/2ML IJ SOLN
INTRAMUSCULAR | Status: AC
  Filled 2019-01-15: qty 2

## 2019-01-15 MED ORDER — LACTATED RINGERS IV SOLN
INTRAVENOUS | Status: DC
  Administered 2019-01-15: 11:00:00 via INTRAVENOUS

## 2019-01-15 MED ORDER — SUCCINYLCHOLINE CHLORIDE 200 MG/10ML IV SOSY
PREFILLED_SYRINGE | INTRAVENOUS | Status: AC
  Filled 2019-01-15: qty 10

## 2019-01-15 MED ORDER — LIDOCAINE HCL (CARDIAC) PF 100 MG/5ML IV SOSY
PREFILLED_SYRINGE | INTRAVENOUS | Status: DC | PRN
  Administered 2019-01-15: 50 mg via INTRAVENOUS

## 2019-01-15 MED ORDER — BUPIVACAINE HCL (PF) 0.25 % IJ SOLN
INTRAMUSCULAR | Status: AC
  Filled 2019-01-15: qty 30

## 2019-01-15 MED ORDER — HYDROCODONE-ACETAMINOPHEN 5-325 MG PO TABS: 1.0000 | ORAL_TABLET | Freq: Three times a day (TID) | ORAL | 0 refills | Status: AC | PRN

## 2019-01-15 MED ORDER — LIDOCAINE-EPINEPHRINE 1 %-1:100000 IJ SOLN
INTRAMUSCULAR | Status: AC
  Filled 2019-01-15: qty 1

## 2019-01-15 MED ORDER — DEXAMETHASONE SODIUM PHOSPHATE 4 MG/ML IJ SOLN
INTRAMUSCULAR | Status: DC | PRN
  Administered 2019-01-15: 10 mg via INTRAVENOUS

## 2019-01-15 MED ORDER — LIDOCAINE 2% (20 MG/ML) 5 ML SYRINGE
INTRAMUSCULAR | Status: AC
  Filled 2019-01-15: qty 5

## 2019-01-15 MED ORDER — MIDAZOLAM HCL 2 MG/2ML IJ SOLN
INTRAMUSCULAR | Status: AC
  Filled 2019-01-15: qty 2

## 2019-01-15 MED ORDER — ROCURONIUM BROMIDE 50 MG/5ML IV SOSY
PREFILLED_SYRINGE | INTRAVENOUS | Status: AC
  Filled 2019-01-15: qty 5

## 2019-01-15 MED ORDER — EPHEDRINE 5 MG/ML INJ
INTRAVENOUS | Status: AC
  Filled 2019-01-15: qty 10

## 2019-01-15 MED ORDER — PROPOFOL 10 MG/ML IV BOLUS
INTRAVENOUS | Status: DC | PRN
  Administered 2019-01-15: 200 mg via INTRAVENOUS

## 2019-01-15 MED ORDER — FENTANYL CITRATE (PF) 100 MCG/2ML IJ SOLN
50.0000 ug | INTRAMUSCULAR | Status: DC | PRN
  Administered 2019-01-15 (×2): 50 ug via INTRAVENOUS

## 2019-01-15 MED FILL — HYDROCODON-APAP 5-325: 5-325 | 3 days supply | Qty: 9 | Fill #0

## 2019-01-15 SURGICAL SUPPLY — 45 items
BLADE CLIPPER SURG (BLADE) IMPLANT
BLADE HEX COATED 2.75 (ELECTRODE) ×2 IMPLANT
BLADE SURG 15 STRL LF DISP TIS (BLADE) ×1 IMPLANT
BLADE SURG 15 STRL SS (BLADE) ×1
CANISTER SUCT 1200ML W/VALVE (MISCELLANEOUS) ×2 IMPLANT
CHLORAPREP W/TINT 26ML (MISCELLANEOUS) ×2 IMPLANT
COVER BACK TABLE 60X90IN (DRAPES) ×2 IMPLANT
COVER MAYO STAND STRL (DRAPES) ×2 IMPLANT
COVER WAND RF STERILE (DRAPES) IMPLANT
DERMABOND ADVANCED (GAUZE/BANDAGES/DRESSINGS) ×1
DERMABOND ADVANCED .7 DNX12 (GAUZE/BANDAGES/DRESSINGS) ×1 IMPLANT
DRAPE LAPAROTOMY 100X72 PEDS (DRAPES) IMPLANT
DRAPE U-SHAPE 76X120 STRL (DRAPES) ×2 IMPLANT
DRSG MEPILEX BORDER 4X8 (GAUZE/BANDAGES/DRESSINGS) ×2 IMPLANT
ELECT COATED BLADE 2.86 ST (ELECTRODE) IMPLANT
ELECT REM PT RETURN 9FT ADLT (ELECTROSURGICAL) ×2
ELECTRODE REM PT RTRN 9FT ADLT (ELECTROSURGICAL) ×1 IMPLANT
GAUZE SPONGE 4X4 12PLY STRL LF (GAUZE/BANDAGES/DRESSINGS) IMPLANT
GLOVE BIO SURGEON STRL SZ 6.5 (GLOVE) ×6 IMPLANT
GLOVE BIO SURGEON STRL SZ7 (GLOVE) ×2 IMPLANT
GLOVE BIOGEL PI IND STRL 7.5 (GLOVE) ×1 IMPLANT
GLOVE BIOGEL PI INDICATOR 7.5 (GLOVE) ×1
GOWN STRL REUS W/ TWL LRG LVL3 (GOWN DISPOSABLE) ×2 IMPLANT
GOWN STRL REUS W/ TWL XL LVL3 (GOWN DISPOSABLE) ×2 IMPLANT
GOWN STRL REUS W/TWL LRG LVL3 (GOWN DISPOSABLE) ×2
GOWN STRL REUS W/TWL XL LVL3 (GOWN DISPOSABLE) ×2
NEEDLE HYPO 25X1 1.5 SAFETY (NEEDLE) ×2 IMPLANT
NS IRRIG 1000ML POUR BTL (IV SOLUTION) ×2 IMPLANT
PACK BASIN DAY SURGERY FS (CUSTOM PROCEDURE TRAY) ×2 IMPLANT
PENCIL BUTTON HOLSTER BLD 10FT (ELECTRODE) ×2 IMPLANT
SPONGE GAUZE 2X2 8PLY STRL LF (GAUZE/BANDAGES/DRESSINGS) IMPLANT
STRIP CLOSURE SKIN 1/2X4 (GAUZE/BANDAGES/DRESSINGS) ×2 IMPLANT
SUCTION FRAZIER HANDLE 10FR (MISCELLANEOUS)
SUCTION TUBE FRAZIER 10FR DISP (MISCELLANEOUS) IMPLANT
SUT ETHILON 4 0 PS 2 18 (SUTURE) IMPLANT
SUT MNCRL AB 4-0 PS2 18 (SUTURE) ×2 IMPLANT
SUT MON AB 3-0 SH 27 (SUTURE)
SUT MON AB 3-0 SH27 (SUTURE) IMPLANT
SUT MON AB 5-0 PS2 18 (SUTURE) IMPLANT
SYR BULB 3OZ (MISCELLANEOUS) ×2 IMPLANT
SYR CONTROL 10ML LL (SYRINGE) ×2 IMPLANT
TOWEL GREEN STERILE FF (TOWEL DISPOSABLE) ×2 IMPLANT
TRAY DSU PREP LF (CUSTOM PROCEDURE TRAY) IMPLANT
TUBE CONNECTING 20X1/4 (TUBING) ×2 IMPLANT
YANKAUER SUCT BULB TIP NO VENT (SUCTIONS) ×2 IMPLANT

## 2019-01-15 NOTE — Discharge Instructions (Addendum)
INSTRUCTIONS FOR AFTER SURGERY ° ° °You are having surgery.  You will likely have some questions about what to expect following your operation.  The following information will help you and your family understand what to expect when you are discharged from the hospital.  Following these guidelines will help ensure a smooth recovery and reduce risks of complications.   °Postoperative instructions include information on: diet, wound care, medications and physical activity. ° °AFTER SURGERY °Expect to go home after the procedure.  In some cases, you may need to spend one night in the hospital for observation. ° °DIET °This surgery does not require a specific diet.  However, I have to mention that the healthier you eat the better your body can start healing. It is important to increasing your protein intake.  This means limiting the foods with sugar and carbohydrates.  Focus on vegetables and some meat.  If you have any liposuction during your procedure be sure to drink water.  If your urine is bright yellow, then it is concentrated, and you need to drink more water.  As a general rule after surgery, you should have 8 ounces of water every hour while awake.  If you find you are persistently nauseated or unable to take in liquids let us know.  NO TOBACCO USE or EXPOSURE.  This will slow your healing process and increase the risk of a wound. ° °WOUND CARE °You can shower the day after surgery if you don't have a drain.  Use fragrance free soap.  Dial, Dove and Ivory are usually mild on the skin. If you have a drain clean with baby wipes until the drain is removed.  If you have steri-strips / tape directly attached to your skin leave them in place. It is OK to get these wet.  No baths, pools or hot tubs for two weeks. °We close your incision to leave the smallest and best-looking scar. No ointment or creams on your incisions until given the go ahead.  Especially not Neosporin (Too many skin reactions with this one).  A few  weeks after surgery you can use Mederma and start massaging the scar. °We ask you to wear your binder or sports bra for the first 6 weeks around the clock, including while sleeping. This provides added comfort and helps reduce the fluid accumulation at the surgery site. ° °ACTIVITY °No heavy lifting until cleared by the doctor.  It is OK to walk and climb stairs. In fact, moving your legs is very important to decrease your risk of a blood clot.  It will also help keep you from getting deconditioned.  Every 1 to 2 hours get up and walk for 5 minutes. This will help with a quicker recovery back to normal.  Let pain be your guide so you don't do too much.  NO, you cannot do the spring cleaning and don't plan on taking care of anyone else.  This is your time for TLC.  °You will be more comfortable if you sleep and rest with your head elevated either with a few pillows under you or in a recliner.  No stomach sleeping for a few months. ° °WORK °Everyone returns to work at different times. As a rough guide, most people take at least 1 - 2 weeks off prior to returning to work. If you need documentation for your job, bring the forms to your postoperative follow up visit. ° °DRIVING °Arrange for someone to bring you home from the hospital.  You   may be able to drive a few days after surgery but not while taking any narcotics or valium.  BOWEL MOVEMENTS Constipation can occur after anesthesia and while taking pain medication.  It is important to stay ahead for your comfort.  We recommend taking Milk of Magnesia (2 tablespoons; twice a day) while taking the pain pills.  SEROMA This is fluid your body tried to put in the surgical site.  This is normal but if it creates tight skinny skin let us know.  It usually decreases in a few weeks.  WHEN TO CALL Call your surgeon's office if any of the following occur:  Fever 101 degrees F or greater  Excessive bleeding or fluid from the incision site.  Pain that increases  over time without aid from the medications  Redness, warmth, or pus draining from incision sites  Persistent nausea or inability to take in liquids  Severe misshapen area that underwent the operation.    Post Anesthesia Home Care Instructions  Activity: Get plenty of rest for the remainder of the day. A responsible individual must stay with you for 24 hours following the procedure.  For the next 24 hours, DO NOT: -Drive a car -Paediatric nurse -Drink alcoholic beverages -Take any medication unless instructed by your physician -Make any legal decisions or sign important papers.  Meals: Start with liquid foods such as gelatin or soup. Progress to regular foods as tolerated. Avoid greasy, spicy, heavy foods. If nausea and/or vomiting occur, drink only clear liquids until the nausea and/or vomiting subsides. Call your physician if vomiting continues.  Special Instructions/Symptoms: Your throat may feel dry or sore from the anesthesia or the breathing tube placed in your throat during surgery. If this causes discomfort, gargle with warm salt water. The discomfort should disappear within 24 hours.  If you had a scopolamine patch placed behind your ear for the management of post- operative nausea and/or vomiting:  1. The medication in the patch is effective for 72 hours, after which it should be removed.  Wrap patch in a tissue and discard in the trash. Wash hands thoroughly with soap and water. 2. You may remove the patch earlier than 72 hours if you experience unpleasant side effects which may include dry mouth, dizziness or visual disturbances. 3. Avoid touching the patch. Wash your hands with soap and water after contact with the patch.     Call your surgeon if you experience:   1.  Fever over 101.0. 2.  Inability to urinate. 3.  Nausea and/or vomiting. 4.  Extreme swelling or bruising at the surgical site. 5.  Continued bleeding from the incision. 6.  Increased pain, redness  or drainage from the incision. 7.  Problems related to your pain medication. 8.  Any problems and/or concerns

## 2019-01-15 NOTE — Anesthesia Procedure Notes (Signed)
Procedure Name: LMA Insertion Date/Time: 01/15/2019 11:16 AM Performed by: Willa Frater, CRNA Pre-anesthesia Checklist: Patient identified, Emergency Drugs available, Suction available and Patient being monitored Patient Re-evaluated:Patient Re-evaluated prior to induction Oxygen Delivery Method: Circle System Utilized Preoxygenation: Pre-oxygenation with 100% oxygen Induction Type: IV induction Ventilation: Mask ventilation without difficulty LMA: LMA inserted LMA Size: 4.0 Number of attempts: 1 Airway Equipment and Method: bite block Placement Confirmation: positive ETCO2 Tube secured with: Tape Dental Injury: Teeth and Oropharynx as per pre-operative assessment

## 2019-01-15 NOTE — Interval H&P Note (Signed)
History and Physical Interval Note:  01/15/2019 10:45 AM  Diane Fox  has presented today for surgery, with the diagnosis of Lipoma of back  The various methods of treatment have been discussed with the patient and family. After consideration of risks, benefits and other options for treatment, the patient has consented to  Procedure(s): EXCISION RIGHT BACK LIPOMA (Right) as a surgical intervention .  The patient's history has been reviewed, patient examined, no change in status, stable for surgery.  I have reviewed the patient's chart and labs.  Questions were answered to the patient's satisfaction.     Loel Lofty Hugo Lybrand

## 2019-01-15 NOTE — Anesthesia Postprocedure Evaluation (Signed)
Anesthesia Post Note  Patient: DHYANA BASTONE  Procedure(s) Performed: EXCISION RIGHT BACK LIPOMA (Right Back)     Patient location during evaluation: PACU Anesthesia Type: General Level of consciousness: awake and alert Pain management: pain level controlled Vital Signs Assessment: post-procedure vital signs reviewed and stable Respiratory status: spontaneous breathing, nonlabored ventilation, respiratory function stable and patient connected to nasal cannula oxygen Cardiovascular status: blood pressure returned to baseline and stable Postop Assessment: no apparent nausea or vomiting Anesthetic complications: no    Last Vitals:  Vitals:   01/15/19 1226 01/15/19 1235  BP:  (!) 153/87  Pulse: 72 81  Resp: 15 16  Temp:  36.7 C  SpO2: 97% 96%    Last Pain:  Vitals:   01/15/19 1235  TempSrc:   PainSc: 0-No pain                 Daylene Vandenbosch

## 2019-01-15 NOTE — Transfer of Care (Signed)
Immediate Anesthesia Transfer of Care Note  Patient: Diane Fox  Procedure(s) Performed: EXCISION RIGHT BACK LIPOMA (Right Back)  Patient Location: PACU  Anesthesia Type:General  Level of Consciousness: awake, alert , oriented and drowsy  Airway & Oxygen Therapy: Patient Spontanous Breathing and Patient connected to face mask oxygen  Post-op Assessment: Report given to RN and Post -op Vital signs reviewed and stable  Post vital signs: Reviewed and stable  Last Vitals:  Vitals Value Taken Time  BP 148/84 01/15/2019 12:03 PM  Temp    Pulse 80 01/15/2019 12:05 PM  Resp 13 01/15/2019 12:05 PM  SpO2 98 % 01/15/2019 12:05 PM  Vitals shown include unvalidated device data.  Last Pain:  Vitals:   01/15/19 1039  TempSrc: Oral  PainSc: 0-No pain         Complications: No apparent anesthesia complications

## 2019-01-15 NOTE — Anesthesia Preprocedure Evaluation (Addendum)
Anesthesia Evaluation  Patient identified by MRN, date of birth, ID band Patient awake    Reviewed: Allergy & Precautions, H&P , NPO status , Patient's Chart, lab work & pertinent test results  History of Anesthesia Complications (+) PONV and history of anesthetic complications  Airway Mallampati: II   Neck ROM: full    Dental   Pulmonary shortness of breath, asthma ,    breath sounds clear to auscultation       Cardiovascular negative cardio ROS   Rhythm:regular Rate:Normal     Neuro/Psych    GI/Hepatic GERD  ,  Endo/Other    Renal/GU Renal InsufficiencyRenal disease     Musculoskeletal  (+) Arthritis ,   Abdominal   Peds  Hematology   Anesthesia Other Findings   Reproductive/Obstetrics                             Anesthesia Physical  Anesthesia Plan  ASA: II  Anesthesia Plan: General   Post-op Pain Management:  Regional for Post-op pain   Induction: Intravenous  PONV Risk Score and Plan: 3 and Ondansetron, Dexamethasone, Treatment may vary due to age or medical condition, TIVA and Scopolamine patch - Pre-op  Airway Management Planned: LMA  Additional Equipment:   Intra-op Plan:   Post-operative Plan: Extubation in OR  Informed Consent: I have reviewed the patients History and Physical, chart, labs and discussed the procedure including the risks, benefits and alternatives for the proposed anesthesia with the patient or authorized representative who has indicated his/her understanding and acceptance.       Plan Discussed with: CRNA, Anesthesiologist and Surgeon  Anesthesia Plan Comments: ( )       Anesthesia Quick Evaluation

## 2019-01-15 NOTE — Op Note (Signed)
DATE OF OPERATION: 01/15/2019  LOCATION: [facility]  PREOPERATIVE DIAGNOSIS: right shoulder lipoma  POSTOPERATIVE DIAGNOSIS: Same  PROCEDURE: Excision of right shoulder lipoma 4 x 6 cm  SURGEON:  Sanger , DO  ASSISTANT: Carmen Mayo, PA  EBL: 5 cc  CONDITION: Stable  COMPLICATIONS: None  INDICATION: The patient, Diane Fox, is a 68 y.o. female born on Sep 21, 1951, is here for treatment of a large posterior shoulder lipoma.   PROCEDURE DETAILS:  The patient was seen prior to surgery and marked.  The IV antibiotics were given. The patient was taken to the operating room and given a general anesthetic. A standard time out was performed and all information was confirmed by those in the room. SCDs were placed.  The patient was placed on the left lateral position.  She was prepped and draped.  Local was injected in the 3 cm area of the incision to be made.  The #15 blade was used to make an incision in the skin.  The bovie was used to dissect to the lipoma.  It looked like a well circumscribed lipoma.  No areas of concern.  It was on the muscle.  No fibers involved.  The 4 x 6 cm lesion / lipoma was excised with light pressure and release with the bovie and hemostat.  The bovie was used for hemostasis.  The pocket was irrigated with saline.  The deep dermis was closed with the 4-0 Monocryl.  The skin was closed with a running subcuticular 4-0 Monocryl.  Derma bond and steri strips were applied.  A sterile dressing was placed.  The patient was allowed to wake up and taken to recovery room in stable condition at the end of the case. The family was notified at the end of the case.   The advanced practice practitioner (APP) assisted throughout the case.  The APP was essential in retraction and counter traction when needed to make the case progress smoothly.  This retraction and assistance made it possible to see the tissue plans for the procedure.  The assistance was  needed for blood control, tissue re-approximation and assisted with closure of the incision site.

## 2019-01-16 ENCOUNTER — Encounter (HOSPITAL_BASED_OUTPATIENT_CLINIC_OR_DEPARTMENT_OTHER): Payer: Self-pay | Admitting: Plastic Surgery

## 2019-01-22 ENCOUNTER — Telehealth: Payer: Self-pay

## 2019-01-22 NOTE — Telephone Encounter (Signed)
Call to pt to inform her that the pathology report from her excision of Limpoma- was negative for any malignancy - it was determined to be a lipoma- per Dr. Marla Roe  pt states   That the bandage is still in place & denies any concerns  Pt has a f/u appointment with Dr. Marla Roe on 01/23/2019  [place], RNFA

## 2019-01-23 ENCOUNTER — Ambulatory Visit (INDEPENDENT_AMBULATORY_CARE_PROVIDER_SITE_OTHER): Payer: Medicare Other | Admitting: Physician Assistant

## 2019-01-23 ENCOUNTER — Encounter: Payer: Self-pay | Admitting: Physician Assistant

## 2019-01-23 VITALS — BP 145/85 | HR 74 | Temp 97.6°F | Ht 63.0 in | Wt 180.0 lb

## 2019-01-23 DIAGNOSIS — D171 Benign lipomatous neoplasm of skin and subcutaneous tissue of trunk: Secondary | ICD-10-CM

## 2019-01-23 NOTE — Progress Notes (Signed)
  Subjective:     Patient ID: Diane Fox, female   DOB: 1951-02-13, 68 y.o.   MRN: [id number]  HPI Very pleasant 68 year old female pt s/p excision of right back lipoma on 01/15/19.  Pt denies pain from the incision.  She reports some itching.  She has not been working out with her upper body this past week to decrease chance of opening incision.    Review of Systems  Constitutional: Negative.   Respiratory: Negative.   Cardiovascular: Negative.   Gastrointestinal: Negative.   Skin: Positive for wound.  Psychiatric/Behavioral: Negative.        Objective:   Physical Exam Constitutional:      Appearance: Normal appearance.  Pulmonary:     Effort: Pulmonary effort is normal.  Abdominal:     Palpations: Abdomen is soft.  Skin:    General: Skin is warm and dry.  Neurological:     Mental Status: She is alert.    3 cm incision noted  Suture and steri strips still in place No edema, erthema or discharge noted    Assessment:     S/p excision of Lipoma    Plan:     Dr Marla Roe visualized the incision because of the location we did not remove sutures and replaced steri strips.   The pt has an upcoming trip to their home on [address] She will continue to refrain from upper body exercise on the right Pt will present to clinic on the 17th for removal of sutures

## 2019-02-02 ENCOUNTER — Ambulatory Visit (INDEPENDENT_AMBULATORY_CARE_PROVIDER_SITE_OTHER): Payer: Medicare Other | Admitting: Physician Assistant

## 2019-02-02 ENCOUNTER — Encounter: Payer: Self-pay | Admitting: Physician Assistant

## 2019-02-02 VITALS — BP 127/82 | HR 67 | Temp 97.4°F | Ht 63.0 in | Wt 180.0 lb

## 2019-02-02 DIAGNOSIS — D171 Benign lipomatous neoplasm of skin and subcutaneous tissue of trunk: Secondary | ICD-10-CM

## 2019-02-02 NOTE — Progress Notes (Signed)
Suture removed & steri strip applied No drainage or bleeding, incision intact Mild bruising noted  Pt states pain is minimal  I instructed her to let steri srtip come off on its on   Pt will call for any questions or concerns  [place], RNFA  I also saw the pt.  She is doing well.  She is pleased with her results.  She will f/u as needed.  Ronette Deter, [facility]

## 2019-03-19 DIAGNOSIS — E785 Hyperlipidemia, unspecified: Secondary | ICD-10-CM | POA: Diagnosis not present

## 2019-03-19 DIAGNOSIS — I10 Essential (primary) hypertension: Secondary | ICD-10-CM | POA: Diagnosis not present

## 2019-03-19 DIAGNOSIS — H9190 Unspecified hearing loss, unspecified ear: Secondary | ICD-10-CM | POA: Diagnosis not present

## 2019-03-19 DIAGNOSIS — Z0000 Encounter for general adult medical examination without abnormal findings: Secondary | ICD-10-CM | POA: Diagnosis not present

## 2019-03-19 DIAGNOSIS — R7301 Impaired fasting glucose: Secondary | ICD-10-CM | POA: Diagnosis not present

## 2019-04-01 ENCOUNTER — Other Ambulatory Visit: Payer: Self-pay | Admitting: Family Medicine

## 2019-04-01 DIAGNOSIS — Z1231 Encounter for screening mammogram for malignant neoplasm of breast: Secondary | ICD-10-CM

## 2019-04-17 NOTE — Telephone Encounter (Signed)
Routed to MD & primary nurse to address

## 2019-04-27 ENCOUNTER — Other Ambulatory Visit: Payer: Self-pay | Admitting: Cardiology

## 2019-06-08 ENCOUNTER — Ambulatory Visit
Admission: RE | Admit: 2019-06-08 | Discharge: 2019-06-08 | Disposition: A | Discharge status: Home / Self Care | Payer: Medicare Other | Source: Ambulatory Visit | Attending: Family Medicine | Admitting: Family Medicine

## 2019-06-08 DIAGNOSIS — Z1231 Encounter for screening mammogram for malignant neoplasm of breast: Secondary | ICD-10-CM

## 2019-06-17 DIAGNOSIS — R600 Localized edema: Secondary | ICD-10-CM | POA: Diagnosis not present

## 2019-06-17 DIAGNOSIS — R209 Unspecified disturbances of skin sensation: Secondary | ICD-10-CM | POA: Diagnosis not present

## 2019-06-17 DIAGNOSIS — I10 Essential (primary) hypertension: Secondary | ICD-10-CM | POA: Diagnosis not present

## 2019-06-17 DIAGNOSIS — E782 Mixed hyperlipidemia: Secondary | ICD-10-CM | POA: Diagnosis not present

## 2019-06-17 DIAGNOSIS — H903 Sensorineural hearing loss, bilateral: Secondary | ICD-10-CM | POA: Diagnosis not present

## 2019-07-14 DIAGNOSIS — H2513 Age-related nuclear cataract, bilateral: Secondary | ICD-10-CM | POA: Diagnosis not present

## 2019-07-14 DIAGNOSIS — H524 Presbyopia: Secondary | ICD-10-CM | POA: Diagnosis not present

## 2019-07-14 DIAGNOSIS — H11153 Pinguecula, bilateral: Secondary | ICD-10-CM | POA: Diagnosis not present

## 2019-07-14 DIAGNOSIS — H5213 Myopia, bilateral: Secondary | ICD-10-CM | POA: Diagnosis not present

## 2019-07-14 DIAGNOSIS — H43811 Vitreous degeneration, right eye: Secondary | ICD-10-CM | POA: Diagnosis not present

## 2019-07-14 DIAGNOSIS — H04123 Dry eye syndrome of bilateral lacrimal glands: Secondary | ICD-10-CM | POA: Diagnosis not present

## 2019-07-14 DIAGNOSIS — H52223 Regular astigmatism, bilateral: Secondary | ICD-10-CM | POA: Diagnosis not present

## 2019-07-27 DIAGNOSIS — I10 Essential (primary) hypertension: Secondary | ICD-10-CM | POA: Diagnosis not present

## 2019-07-27 DIAGNOSIS — R609 Edema, unspecified: Secondary | ICD-10-CM | POA: Diagnosis not present

## 2019-08-27 DIAGNOSIS — R609 Edema, unspecified: Secondary | ICD-10-CM | POA: Diagnosis not present

## 2019-08-27 DIAGNOSIS — I10 Essential (primary) hypertension: Secondary | ICD-10-CM | POA: Diagnosis not present

## 2019-08-27 DIAGNOSIS — E782 Mixed hyperlipidemia: Secondary | ICD-10-CM | POA: Diagnosis not present

## 2019-08-30 NOTE — Progress Notes (Signed)
Diane Fox Date of Birth: 09/10/1951 Medical Record [id number]  History of Present Illness: Seen today for followup hypercholesterolemia. She has a history of hypercholesterolemia and is on Crestor therapy.  She had a normal stress Echo in April 2015. 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   On prior visit she complained of DOE. She has a chronic cough with extensive pulmonary evaluation before. Stress Echo in June 2019 was normal.  On follow up today she is staying quite active. She walks daily and has a Physiological scientist. She does yoga 3x/week and water aerobics during the summer. She denies any chest pain or dyspnea.  CT chest was normal except for some coronary calcification. She had a normal stress Echo last year.   Current Outpatient Medications on File Prior to Visit  Medication Sig Dispense Refill  . Calcium Carbonate (CALCIUM 600 PO) Take by mouth.    . chlorpheniramine (CHLOR-TRIMETON) 4 MG tablet Take 4 mg by mouth daily as needed for allergies.     . cholecalciferol (VITAMIN D) 1000 units tablet Take 1,000 Units by mouth daily.    . Coenzyme Q10 (CO Q 10 PO) Take 200 mg by mouth daily.    . fexofenadine (ALLEGRA) 180 MG tablet Take 180 mg by mouth daily.    . Melatonin 1 MG CAPS daily as needed.    . Multiple Vitamins-Minerals (CENTRUM SILVER PO) Take by mouth.    Marland Kitchen omeprazole (PRILOSEC) 20 MG capsule Take 20 mg by mouth daily.    . rosuvastatin (CRESTOR) 10 MG tablet TAKE 1 TABLET BY MOUTH  DAILY 90 tablet 3  . triamcinolone (NASACORT ALLERGY 24HR) 55 MCG/ACT AERO nasal inhaler Place 2 sprays into the nose daily.    Marland Kitchen triamterene-hydrochlorothiazide (MAXZIDE) 75-50 MG per tablet Take 1 tablet by mouth every morning.     . vitamin C (ASCORBIC ACID) 500 MG tablet Take 500 mg by mouth daily.    . vitamin E 400 UNIT capsule Take 400 Units by mouth daily.     No current facility-administered medications on file prior to visit.     Allergies  Allergen Reactions  .  Biaxin [Clarithromycin] Hives  . Penicillins Hives    Has patient had a PCN reaction causing immediate rash, facial/tongue/throat swelling, SOB or lightheadedness with hypotension: No Has patient had a PCN reaction causing severe rash involving mucus membranes or skin necrosis: No Has patient had a PCN reaction that required hospitalization: No Has patient had a PCN reaction occurring within the last 10 years: Yes If all of the above answers are "NO", then may proceed with Cephalosporin use.   . Sulfa Antibiotics Hives  . Zostavax [Zoster Vaccine Live] Hives    Past Medical History:  Diagnosis Date  . Arthritis    bilateral knees and hands  . Asthma    allergy related, no attacks  . Atypical chest pain 02/2014   april stress test=normal  . 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 Chronic kidney disease    GFR 58, mild  . Dyspnea   . Edema    maxide prn  . Environmental allergies   . GERD (gastroesophageal reflux disease)   . H/O measles   . H/O mumps   . Hypercholesterolemia   . PONV (postoperative nausea and vomiting)     Past Surgical History:  Procedure Laterality Date  . BREAST BIOPSY Right 09/2002   focal atypical lobular hyperplasia  . BREAST SURGERY Right 10/03  focal atypia hyperplasia  . DILATION AND CURETTAGE OF UTERUS     x2  . HYSTEROSCOPY W/D&C  07/2007   polyp removed-benign  . LIPOMA EXCISION Right 01/15/2019   Procedure: EXCISION RIGHT BACK LIPOMA;  Surgeon: Wallace Going, DO;  Location: [facility];  Service: Plastics;  Laterality: Right;  . PATELLA-FEMORAL ARTHROPLASTY Left 11/23/2013   Procedure: LEFT KNEE PATELLA-FEMORAL ARTHROPLASTY;  Surgeon: Gearlean Alf, MD;  Location: [facility];  Service: Orthopedics;  Laterality: Left;  . RIGHT OOPHORECTOMY Right 11/91   tubal ligation with complications  . TOTAL KNEE ARTHROPLASTY Left 07/05/2014   Procedure: LEFT TOTAL KNEE ARTHROPLASTY;  Surgeon: Gearlean Alf, MD;   Location: [facility];  Service: Orthopedics;  Laterality: Left;  . TOTAL KNEE ARTHROPLASTY Right 11/11/2017   Procedure: RIGHT TOTAL KNEE ARTHROPLASTY;  Surgeon: Gaynelle Arabian, MD;  Location: [facility];  Service: Orthopedics;  Laterality: Right;  with abductor block  . TUBAL LIGATION  1991  . WISDOM TOOTH EXTRACTION  early 20's    Social History   Tobacco Use  Smoking Status Never Smoker  Smokeless Tobacco Never Used    Social History   Substance and Sexual Activity  Alcohol Use Yes  . Alcohol/week: 1.0 standard drinks  . Types: 1 Glasses of wine per week   Comment: social    Family History  Problem Relation Age of Onset  . Heart disease Father   . Heart attack Father   . Hypertension Father   . Heart disease Mother   . Hypertension Mother   . Asthma Brother   . Cancer Maternal Grandmother        lymphoma  . Cancer Maternal Grandfather        prostate cancer    Review of Systems: As noted in history of present illness. Positive for cough.  All other systems were reviewed and are negative.  Physical Exam: BP (!) 143/80   Pulse 67   Ht 5\' 3"  (1.6 m)   Wt 183 lb (83 kg)   LMP 10/27/2012   SpO2 97%   BMI 32.42 kg/m  GENERAL:  Well appearing WF in NAD HEENT:  PERRL, EOMI, sclera are clear. Oropharynx is clear. NECK:  No jugular venous distention, carotid upstroke brisk and symmetric, no bruits, no thyromegaly or adenopathy LUNGS:  Clear to auscultation bilaterally CHEST:  Unremarkable HEART:  RRR,  PMI not displaced or sustained,S1 and S2 within normal limits, no S3, no S4: no clicks, no rubs, no murmurs ABD:  Soft, nontender. BS +, no masses or bruits. No hepatomegaly, no splenomegaly EXT:  2 + pulses throughout, no edema, no cyanosis no clubbing SKIN:  Warm and dry.  No rashes NEURO:  Alert and oriented x 3. Cranial nerves II through XII intact. PSYCH:  Cognitively intact   LABORATORY DATA:  Lab Results  Component Value Date   WBC 11.6 (H) 11/14/2017   HGB  12.2 11/14/2017   HCT 36.6 11/14/2017   PLT 320 11/14/2017   GLUCOSE 95 01/12/2019   CHOL 158 09/02/2018   TRIG 139 09/02/2018   HDL 59 09/02/2018   LDLCALC 71 09/02/2018   ALT 28 09/02/2018   AST 19 09/02/2018   NA 137 01/12/2019   K 3.9 01/12/2019   CL 101 01/12/2019   CREATININE 0.96 01/12/2019   BUN 19 01/12/2019   CO2 26 01/12/2019   INR 0.94 11/04/2017   Labs dated 05/01/17 reviewed: Normal  Chemistry panel and TSH. Cholesterol- 176, trig- 129, HDL-  61, LDL 89.  Dated 05/14/18: cholesterol 196, triglycerides 158, HDL 65, LDL 100. Chemistries and CBC normal. Dated 06/17/19: normal CBC and LFTs. Normal TSH Dated 08/27/19: cholesterol 165, triglycerides 153, HDL 60, LDL 75. Normal BMET  Ecg today shows NSR with rate 67. Nonspecific TWA in the precordial leads. I have personally reviewed and interpreted this study.  Stress Echo 05/28/18: Study Conclusions  - Stress ECG conclusions: There were no stress arrhythmias or   conduction abnormalities. The stress ECG was negative for   ischemia. - Staged echo: There was no echocardiographic evidence for   stress-induced ischemia.  Impressions:  - Normal hyperdynamic response to exercise. Normal stress   echocardiogram. Good exrcise capacity. Normal BP response to   exercise.  Assessment / Plan:  1. Hypercholesterolemia. Good control on Crestor with LDL 75.   Encourage continued  lifestyle modifications.    2. Dyspnea on exertion. Improved. Negative pulmonary evaluation in the past. Normal stress Echo last year. Need to repeat ischemic evaluation.   3. HTN. Recently started on losartan with improved readings at home 130/70  Follow up in one year.

## 2019-09-01 ENCOUNTER — Ambulatory Visit (INDEPENDENT_AMBULATORY_CARE_PROVIDER_SITE_OTHER): Payer: Medicare Other | Admitting: Cardiology

## 2019-09-01 ENCOUNTER — Encounter: Payer: Self-pay | Admitting: Cardiology

## 2019-09-01 ENCOUNTER — Other Ambulatory Visit: Payer: Self-pay

## 2019-09-01 VITALS — BP 143/80 | HR 67 | Ht 63.0 in | Wt 183.0 lb

## 2019-09-01 DIAGNOSIS — E7800 Pure hypercholesterolemia, unspecified: Secondary | ICD-10-CM | POA: Diagnosis not present

## 2019-09-01 DIAGNOSIS — I10 Essential (primary) hypertension: Secondary | ICD-10-CM

## 2019-09-15 ENCOUNTER — Other Ambulatory Visit: Payer: Self-pay

## 2019-09-15 DIAGNOSIS — I872 Venous insufficiency (chronic) (peripheral): Secondary | ICD-10-CM

## 2019-09-15 NOTE — Progress Notes (Signed)
[facility] [place]   Reason for referral: Swollen left > right leg  History of Present Illness  Diane Fox is a 68 y.o. female who presents with chief complaint: swollen leg left > right.  Patient notes, onset of swelling increased with throbbing 6 months ago.   The patient has had no history of DVT, no history of varicose vein, no history of venous stasis ulcers, no history of  Lymphedema and no history of skin changes in lower legs.  There is a family history of venous disorders with her mother, but no treatment.  The patient has  used compression stockings in the past.  Past medical history includes: HTN, B TKA, and hypercholesterolemia.  Past Medical History:  Diagnosis Date  . Arthritis    bilateral knees and hands  . Asthma    allergy related, no attacks  . Atypical chest pain 02/2014   april stress test=normal  . 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 Chronic kidney disease    GFR 58, mild  . Dyspnea   . Edema    maxide prn  . Environmental allergies   . GERD (gastroesophageal reflux disease)   . H/O measles   . H/O mumps   . Hypercholesterolemia   . PONV (postoperative nausea and vomiting)     Past Surgical History:  Procedure Laterality Date  . BREAST BIOPSY Right 09/2002   focal atypical lobular hyperplasia  . BREAST SURGERY Right 10/03   focal atypia hyperplasia  . DILATION AND CURETTAGE OF UTERUS     x2  . HYSTEROSCOPY W/D&C  07/2007   polyp removed-benign  . LIPOMA EXCISION Right 01/15/2019   Procedure: EXCISION RIGHT BACK LIPOMA;  Surgeon: Wallace Going, DO;  Location: [facility];  Service: Plastics;  Laterality: Right;  . PATELLA-FEMORAL ARTHROPLASTY Left 11/23/2013   Procedure: LEFT KNEE PATELLA-FEMORAL ARTHROPLASTY;  Surgeon: Gearlean Alf, MD;  Location: [facility];  Service: Orthopedics;  Laterality: Left;  . RIGHT OOPHORECTOMY Right 11/91   tubal ligation with complications  . TOTAL  KNEE ARTHROPLASTY Left 07/05/2014   Procedure: LEFT TOTAL KNEE ARTHROPLASTY;  Surgeon: Gearlean Alf, MD;  Location: [facility];  Service: Orthopedics;  Laterality: Left;  . TOTAL KNEE ARTHROPLASTY Right 11/11/2017   Procedure: RIGHT TOTAL KNEE ARTHROPLASTY;  Surgeon: Gaynelle Arabian, MD;  Location: [facility];  Service: Orthopedics;  Laterality: Right;  with abductor block  . TUBAL LIGATION  1991  . WISDOM TOOTH EXTRACTION  early 20's    Social History   Socioeconomic History  . Marital status: Married    Spouse name: Not on file  . Number of children: 2  . Years of education: Not on file  . Highest education level: Not on file  Occupational History  . Occupation: Building control surveyor: [employer]  . Financial resource strain: Not on file  . Food insecurity    Worry: Not on file    Inability: Not on file  . Transportation needs    Medical: Not on file    Non-medical: Not on file  Tobacco Use  . Smoking status: Never Smoker  . Smokeless tobacco: Never Used  Substance and Sexual Activity  . Alcohol use: Yes    Alcohol/week: 1.0 standard drinks    Types: 1 Glasses of wine per week    Comment: social  . Drug use: No  . Sexual activity: Not Currently    Birth  control/protection: Surgical    Comment: BTL  Lifestyle  . Physical activity    Days per week: Not on file    Minutes per session: Not on file  . Stress: Not on file  Relationships  . Social Herbalist on phone: Not on file    Gets together: Not on file    Attends religious service: Not on file    Active member of club or organization: Not on file    Attends meetings of clubs or organizations: Not on file    Relationship status: Not on file  . Intimate partner violence    Fear of current or ex partner: Not on file    Emotionally abused: Not on file    Physically abused: Not on file    Forced sexual activity: Not on file  Other Topics Concern  . Not on file  Social History Narrative  . Not on file     Family History  Problem Relation Age of Onset  . Heart disease Father   . Heart attack Father   . Hypertension Father   . Heart disease Mother   . Hypertension Mother   . Asthma Brother   . Cancer Maternal Grandmother        lymphoma  . Cancer Maternal Grandfather        prostate cancer    Current Outpatient Medications on File Prior to Visit  Medication Sig Dispense Refill  . Calcium Carbonate (CALCIUM 600 PO) Take by mouth.    . chlorpheniramine (CHLOR-TRIMETON) 4 MG tablet Take 4 mg by mouth daily as needed for allergies.     . cholecalciferol (VITAMIN D) 1000 units tablet Take 1,000 Units by mouth daily.    . Coenzyme Q10 (CO Q 10 PO) Take 200 mg by mouth daily.    . fexofenadine (ALLEGRA) 180 MG tablet Take 180 mg by mouth daily.    Marland Kitchen losartan (COZAAR) 25 MG tablet     . Melatonin 1 MG CAPS daily as needed.    . Multiple Vitamins-Minerals (CENTRUM SILVER PO) Take by mouth.    Marland Kitchen omeprazole (PRILOSEC) 20 MG capsule Take 20 mg by mouth daily.    . rosuvastatin (CRESTOR) 10 MG tablet TAKE 1 TABLET BY MOUTH  DAILY 90 tablet 3  . triamcinolone (NASACORT ALLERGY 24HR) 55 MCG/ACT AERO nasal inhaler Place 2 sprays into the nose daily.    Marland Kitchen triamterene-hydrochlorothiazide (MAXZIDE) 75-50 MG per tablet Take 1 tablet by mouth every morning.     . vitamin C (ASCORBIC ACID) 500 MG tablet Take 500 mg by mouth daily.    . vitamin E 400 UNIT capsule Take 400 Units by mouth daily.     No current facility-administered medications on file prior to visit.     Allergies as of 09/18/2019 - Review Complete 09/18/2019  Allergen Reaction Noted  . Biaxin [clarithromycin] Hives 01/23/2013  . Penicillins Hives 11/05/2011  . Sulfa antibiotics Hives 11/05/2011  . Zostavax [zoster vaccine live] Hives 01/23/2013     ROS:   General:  No weight loss, Fever, chills  HEENT: No recent headaches, no nasal bleeding, no visual changes, no sore throat  Neurologic: No dizziness, blackouts, seizures.  No recent symptoms of stroke or mini- stroke. No recent episodes of slurred speech, or temporary blindness.  Cardiac: No recent episodes of chest pain/pressure, no shortness of breath at rest.  No shortness of breath with exertion.  Denies history of atrial fibrillation or irregular heartbeat  Vascular: No  history of rest pain in feet.  No history of claudication.  No history of non-healing ulcer, No history of DVT   Pulmonary: No home oxygen, no productive cough, no hemoptysis,  No asthma or wheezing  Musculoskeletal:  [x ] Arthritis, [ ]  Low back pain,  [ ]  Joint pain  Hematologic:No history of hypercoagulable state.  No history of easy bleeding.  No history of anemia  Gastrointestinal: No hematochezia or melena,  No gastroesophageal reflux, no trouble swallowing  Urinary: [ ]  chronic Kidney disease, [ ]  on HD - [ ]  MWF or [ ]  TTHS, [ ]  Burning with urination, [ ]  Frequent urination, [ ]  Difficulty urinating;   Skin: No rashes  Psychological: No history of anxiety,  No history of depression  Physical Examination  Vitals:   09/18/19 0857  BP: 126/76  Pulse: 66  Resp: 18  Temp: 97.9 F (36.6 C)  SpO2: 99%  Weight: 182 lb 9.6 oz (82.8 kg)  Height: 5\' 3"  (1.6 m)    Body mass index is 32.35 kg/m.  General:  Alert and oriented, no acute distress HEENT: Normal Neck: No bruit or JVD Pulmonary: Clear to auscultation bilaterally Cardiac: Regular Rate and Rhythm without murmur Abdomen: Soft, non-tender, non-distended, no mass, no scars Skin: No rash Extremity Pulses:  2+ radial, brachial, femoral, dorsalis pedis, posterior tibial pulses bilaterally Musculoskeletal: No deformity well healed B knee incisions, B LE left > right non pitting edema  Neurologic: Upper and lower extremity motor 5/5 and symmetric  DATA:   Venous Reflux Times Normal value < 0.5 sec +------------------------------+----------+---------+                               Right (ms)Left  (ms) +------------------------------+----------+---------+ CFV                           528.00    719.00    +------------------------------+----------+---------+ GSV at Saphenofemoral junction          851.00    +------------------------------+----------+---------+ GSV prox thigh                2303.00             +------------------------------+----------+---------+ GSV mid thigh                 3330.00             +------------------------------+----------+---------+ GSV dist thigh                6308.00             +------------------------------+----------+---------+ GSV at knee                   4724.00             +------------------------------+----------+---------+ GSV prox calf                 5303.00   821.00    +------------------------------+----------+---------+ GSV mid calf                  6015.00             +------------------------------+----------+---------+  +------------------------------+----------+---------+ VEIN DIAMETERS:               Right (cm)Left (cm) +------------------------------+----------+---------+ GSV at Saphenofemoral junction0.70      0.63      +------------------------------+----------+---------+ GSV at prox thigh  0.62      0.53      +------------------------------+----------+---------+ GSV at mid thigh              0.50      0.35      +------------------------------+----------+---------+ GSV at distal thigh           0.44      0.39      +------------------------------+----------+---------+ GSV at knee                   0.48      0.33      +------------------------------+----------+---------+ GSV prox calf                 0.44      0.27      +------------------------------+----------+---------+ GSV mid calf                  0.48      0.22      +------------------------------+----------+---------+ SSV origin                    0.19      0.46       +------------------------------+----------+---------+ SSV prox                      0.25      0.37      +------------------------------+----------+---------+ SSV mid                       0.25      0.28      +------------------------------+----------+---------+      Assessment: Venous insufficiency right LE > left Without history of non healing wounds or history of DVT.  Plan: I recommended thigh high compression 15-20 mmhg, elevation when at rest, activity as tolerates and water therapy if available.   She may be a candidate for laser ablation therapy  After completing a 3 month trial with thigh high compression.  She is not at risk of limb loss and has no skin changes.    She has opted to continue with compression for now and not pursue laser abltion therapy now.  If she has problems or concerns in the future she will call.  F/U PRN.  Roxy Horseman PA-C [facility] [place] Office: [phone]  MD in clinic [place]

## 2019-09-17 DIAGNOSIS — Z23 Encounter for immunization: Secondary | ICD-10-CM | POA: Diagnosis not present

## 2019-09-18 ENCOUNTER — Ambulatory Visit (INDEPENDENT_AMBULATORY_CARE_PROVIDER_SITE_OTHER): Payer: Medicare Other | Admitting: Physician Assistant

## 2019-09-18 ENCOUNTER — Ambulatory Visit (HOSPITAL_COMMUNITY)
Admission: RE | Admit: 2019-09-18 | Discharge: 2019-09-18 | Disposition: A | Discharge status: Home / Self Care | Payer: Medicare Other | Source: Ambulatory Visit | Attending: Family | Admitting: Family

## 2019-09-18 ENCOUNTER — Other Ambulatory Visit: Payer: Self-pay

## 2019-09-18 VITALS — BP 126/76 | HR 66 | Temp 97.9°F | Resp 18 | Ht 63.0 in | Wt 182.6 lb

## 2019-09-18 DIAGNOSIS — I872 Venous insufficiency (chronic) (peripheral): Secondary | ICD-10-CM

## 2020-01-13 ENCOUNTER — Ambulatory Visit: Payer: Medicare Other

## 2020-01-20 DIAGNOSIS — I10 Essential (primary) hypertension: Secondary | ICD-10-CM | POA: Diagnosis not present

## 2020-01-20 DIAGNOSIS — N189 Chronic kidney disease, unspecified: Secondary | ICD-10-CM | POA: Diagnosis not present

## 2020-01-20 DIAGNOSIS — M8580 Other specified disorders of bone density and structure, unspecified site: Secondary | ICD-10-CM | POA: Diagnosis not present

## 2020-01-20 DIAGNOSIS — E785 Hyperlipidemia, unspecified: Secondary | ICD-10-CM | POA: Diagnosis not present

## 2020-01-20 DIAGNOSIS — E782 Mixed hyperlipidemia: Secondary | ICD-10-CM | POA: Diagnosis not present

## 2020-01-22 ENCOUNTER — Ambulatory Visit: Payer: Medicare Other

## 2020-01-27 ENCOUNTER — Other Ambulatory Visit: Payer: Self-pay

## 2020-01-29 ENCOUNTER — Ambulatory Visit (INDEPENDENT_AMBULATORY_CARE_PROVIDER_SITE_OTHER): Payer: Medicare Other | Admitting: Obstetrics & Gynecology

## 2020-01-29 ENCOUNTER — Other Ambulatory Visit: Payer: Self-pay

## 2020-01-29 ENCOUNTER — Other Ambulatory Visit (HOSPITAL_COMMUNITY)
Admission: RE | Admit: 2020-01-29 | Discharge: 2020-01-29 | Disposition: A | Discharge status: Home / Self Care | Payer: Medicare Other | Source: Ambulatory Visit | Attending: Obstetrics & Gynecology | Admitting: Obstetrics & Gynecology

## 2020-01-29 ENCOUNTER — Encounter: Payer: Self-pay | Admitting: Obstetrics & Gynecology

## 2020-01-29 VITALS — BP 112/70 | HR 88 | Temp 97.2°F | Resp 10 | Ht 62.5 in | Wt 184.0 lb

## 2020-01-29 DIAGNOSIS — Z01419 Encounter for gynecological examination (general) (routine) without abnormal findings: Secondary | ICD-10-CM | POA: Diagnosis not present

## 2020-01-29 DIAGNOSIS — Z124 Encounter for screening for malignant neoplasm of cervix: Secondary | ICD-10-CM | POA: Insufficient documentation

## 2020-01-29 NOTE — Progress Notes (Signed)
69 y.o. [id number] Married White or Caucasian female here for annual exam.  Doing well.  Denies vaginal bleeding.  Had her first Covid vaccination.    Patient's last menstrual period was 10/27/2012.          Sexually active: No.  The current method of family planning is post menopausal status.    Exercising: Yes.    walking, yoga, and bike riding Smoker:  no  Health Maintenance: Pap:  07/22/17 Neg              07/05/15 Neg. HR HPV:neg  History of abnormal Pap:  no MMG:  06/08/19 BIRADS 1 negative/density c Colonoscopy:  2012 f/u 10 years BMD:   01/25/16 Normal TDaP:  2017 Pneumonia vaccine(s):  completed Shingrix:   Completed  Hep C testing: PCP Screening Labs: PCP   reports that she has never smoked. She has never used smokeless tobacco. She reports current alcohol use of about 1.0 standard drinks of alcohol per week. She reports that she does not use drugs.  Past Medical History:  Diagnosis Date  . Arthritis    bilateral knees and hands  . Asthma    allergy related, no attacks  . Atypical chest pain 02/2014   april stress test=normal  . 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 Chronic kidney disease    GFR 58, mild  . Dyspnea   . Edema    maxide prn  . Environmental allergies   . GERD (gastroesophageal reflux disease)   . H/O measles   . H/O mumps   . Hypercholesterolemia   . PONV (postoperative nausea and vomiting)     Past Surgical History:  Procedure Laterality Date  . BREAST BIOPSY Right 09/2002   focal atypical lobular hyperplasia  . BREAST SURGERY Right 10/03   focal atypia hyperplasia  . DILATION AND CURETTAGE OF UTERUS     x2  . HYSTEROSCOPY WITH D & C  07/2007   polyp removed-benign  . LIPOMA EXCISION Right 01/15/2019   Procedure: EXCISION RIGHT BACK LIPOMA;  Surgeon: Wallace Going, DO;  Location: [facility];  Service: Plastics;  Laterality: Right;  . PATELLA-FEMORAL ARTHROPLASTY Left 11/23/2013   Procedure: LEFT KNEE  PATELLA-FEMORAL ARTHROPLASTY;  Surgeon: Gearlean Alf, MD;  Location: [facility];  Service: Orthopedics;  Laterality: Left;  . RIGHT OOPHORECTOMY Right 11/91   tubal ligation with complications  . TOTAL KNEE ARTHROPLASTY Left 07/05/2014   Procedure: LEFT TOTAL KNEE ARTHROPLASTY;  Surgeon: Gearlean Alf, MD;  Location: [facility];  Service: Orthopedics;  Laterality: Left;  . TOTAL KNEE ARTHROPLASTY Right 11/11/2017   Procedure: RIGHT TOTAL KNEE ARTHROPLASTY;  Surgeon: Gaynelle Arabian, MD;  Location[facility];  Service: Orthopedics;  Laterality: Right;  with abductor block  . TUBAL LIGATION  1991  . WISDOM TOOTH EXTRACTION  early 20's    Current Outpatient Medications  Medication Sig Dispense Refill  . Calcium Carbonate (CALCIUM 600 PO) Take by mouth.    . chlorpheniramine (CHLOR-TRIMETON) 4 MG tablet Take 4 mg by mouth daily as needed for allergies.     . cholecalciferol (VITAMIN D) 1000 units tablet Take 1,000 Units by mouth daily.    . Coenzyme Q10 (CO Q 10 PO) Take 200 mg by mouth daily.    . fexofenadine (ALLEGRA) 180 MG tablet Take 180 mg by mouth daily.    Marland Kitchen losartan (COZAAR) 25 MG tablet     . Melatonin 1 MG CAPS daily as needed.    Marland Kitchen  Multiple Vitamins-Minerals (CENTRUM SILVER PO) Take by mouth.    Marland Kitchen omeprazole (PRILOSEC) 20 MG capsule Take 20 mg by mouth daily.    . rosuvastatin (CRESTOR) 10 MG tablet TAKE 1 TABLET BY MOUTH  DAILY 90 tablet 3  . triamcinolone (NASACORT ALLERGY 24HR) 55 MCG/ACT AERO nasal inhaler Place 2 sprays into the nose daily.    Marland Kitchen triamterene-hydrochlorothiazide (MAXZIDE) 75-50 MG per tablet Take 1 tablet by mouth every morning.     . vitamin C (ASCORBIC ACID) 500 MG tablet Take 500 mg by mouth daily.    . vitamin E 400 UNIT capsule Take 400 Units by mouth daily.     No current facility-administered medications for this visit.    Family History  Problem Relation Age of Onset  . Heart disease Father   . Heart attack Father   . Hypertension Father   . Heart  disease Mother   . Hypertension Mother   . Asthma Brother   . Cancer Maternal Grandmother        lymphoma  . Cancer Maternal Grandfather        prostate cancer    Review of Systems  All other systems reviewed and are negative.   Exam:   BP 112/70 (BP Location: Left Arm, Patient Position: Sitting, Cuff Size: Normal)   Pulse 88   Temp (!) 97.2 F (36.2 C) (Temporal)   Resp 10   Ht 5' 2.5" (1.588 m)   Wt 184 lb (83.5 kg)   LMP 10/27/2012   BMI 33.12 kg/m     Height: 5' 2.5" (158.8 cm)  Ht Readings from Last 3 Encounters:  01/29/20 5' 2.5" (1.588 m)  09/18/19 5\' 3"  (1.6 m)  09/01/19 5\' 3"  (1.6 m)    General appearance: alert, cooperative and appears stated age Head: Normocephalic, without obvious abnormality, atraumatic Neck: no adenopathy, supple, symmetrical, trachea midline and thyroid normal to inspection and palpation Lungs: clear to auscultation bilaterally Breasts: normal appearance, no masses or tenderness, well healed right breast scar Heart: regular rate and rhythm Abdomen: soft, non-tender; bowel sounds normal; no masses,  no organomegaly Extremities: extremities normal, atraumatic, no cyanosis or edema Skin: Skin color, texture, turgor normal. No rashes or lesions Lymph nodes: Cervical, supraclavicular, and axillary nodes normal. No abnormal inguinal nodes palpated Neurologic: Grossly normal   Pelvic: External genitalia:  no lesions              Urethra:  normal appearing urethra with no masses, tenderness or lesions              Bartholins and Skenes: normal                 Vagina: normal appearing vagina with normal color and discharge, 2cm posterior vaginal wall cyst, mobile              Cervix: no lesions              Pap taken: Yes.   Bimanual Exam:  Uterus:  normal size, contour, position, consistency, mobility, non-tender              Adnexa: normal adnexa and no mass, fullness, tenderness               Rectovaginal: Confirms               Anus:   normal sphincter tone, no lesions  Chaperone, Terence Lux, CMA, was present for exam.  A:  Well Woman with normal exam PMP, no  HRT H/o BTLL, then right oophorectomy 1991 Elevated lipids Hypertension H/o mildly decreased GFr H/o atypical lobular hyperplasia s/p lumpectomy on right Posterior vaginal wall cyst--although new this is thin walled, smooth and mobile.  Will recheck with AEX  P:   Mammogram guidelines reviewed.  Doing yearly.  pap smear only obtained today.  Pt aware will plan one more than then stop. BMD due 2023 Vaccines UTD. Has started Covid vaccination. Colonoscopy UTD.  D/w pt possibly doing cologuard with next screening. Lab work UTD with Dr. Mannie Stabile return annually or prn

## 2020-02-01 LAB — CYTOLOGY - PAP: Diagnosis: NEGATIVE

## 2020-02-03 ENCOUNTER — Ambulatory Visit: Payer: Medicare Other

## 2020-03-02 DIAGNOSIS — R05 Cough: Secondary | ICD-10-CM | POA: Diagnosis not present

## 2020-03-02 DIAGNOSIS — K219 Gastro-esophageal reflux disease without esophagitis: Secondary | ICD-10-CM | POA: Diagnosis not present

## 2020-03-30 DIAGNOSIS — M8580 Other specified disorders of bone density and structure, unspecified site: Secondary | ICD-10-CM | POA: Diagnosis not present

## 2020-03-30 DIAGNOSIS — Z0000 Encounter for general adult medical examination without abnormal findings: Secondary | ICD-10-CM | POA: Diagnosis not present

## 2020-03-30 DIAGNOSIS — N189 Chronic kidney disease, unspecified: Secondary | ICD-10-CM | POA: Diagnosis not present

## 2020-03-30 DIAGNOSIS — I10 Essential (primary) hypertension: Secondary | ICD-10-CM | POA: Diagnosis not present

## 2020-03-30 DIAGNOSIS — E782 Mixed hyperlipidemia: Secondary | ICD-10-CM | POA: Diagnosis not present

## 2020-03-31 ENCOUNTER — Other Ambulatory Visit: Payer: Self-pay | Admitting: Family Medicine

## 2020-03-31 DIAGNOSIS — M8580 Other specified disorders of bone density and structure, unspecified site: Secondary | ICD-10-CM

## 2020-04-15 ENCOUNTER — Other Ambulatory Visit: Payer: Self-pay | Admitting: Family Medicine

## 2020-04-15 DIAGNOSIS — Z1231 Encounter for screening mammogram for malignant neoplasm of breast: Secondary | ICD-10-CM

## 2020-05-06 ENCOUNTER — Other Ambulatory Visit: Payer: Self-pay | Admitting: Cardiology

## 2020-05-09 NOTE — Telephone Encounter (Signed)
Rx request sent to pharmacy.  

## 2020-06-29 ENCOUNTER — Ambulatory Visit
Admission: RE | Admit: 2020-06-29 | Discharge: 2020-06-29 | Disposition: A | Discharge status: Home / Self Care | Payer: Medicare Other | Source: Ambulatory Visit | Attending: Family Medicine | Admitting: Family Medicine

## 2020-06-29 ENCOUNTER — Other Ambulatory Visit: Payer: Self-pay

## 2020-06-29 DIAGNOSIS — Z780 Asymptomatic menopausal state: Secondary | ICD-10-CM | POA: Diagnosis not present

## 2020-06-29 DIAGNOSIS — Z1231 Encounter for screening mammogram for malignant neoplasm of breast: Secondary | ICD-10-CM

## 2020-06-29 DIAGNOSIS — M8580 Other specified disorders of bone density and structure, unspecified site: Secondary | ICD-10-CM

## 2020-07-04 ENCOUNTER — Encounter: Payer: Self-pay | Admitting: Podiatry

## 2020-07-04 ENCOUNTER — Other Ambulatory Visit: Payer: Self-pay

## 2020-07-04 ENCOUNTER — Ambulatory Visit (INDEPENDENT_AMBULATORY_CARE_PROVIDER_SITE_OTHER): Payer: Medicare Other | Admitting: Podiatry

## 2020-07-04 DIAGNOSIS — M7751 Other enthesopathy of right foot: Secondary | ICD-10-CM

## 2020-07-04 MED ORDER — METHYLPREDNISOLONE 4 MG PO TBPK: ORAL_TABLET | ORAL | 0 refills | Status: DC

## 2020-07-04 NOTE — Progress Notes (Signed)
   HPI: 69 y.o. female presenting today for evaluation of right forefoot pain that been going on for a few months now.  Patient states that she feels like she is walking on a golf ball despite wearing good conservative shoes.  She has been applying ice and pads with minimal relief.  She denies injury.  She presents for further treatment evaluation  Past Medical History:  Diagnosis Date  . Arthritis    bilateral knees and hands  . Asthma    allergy related, no attacks  . Atypical chest pain 02/2014   april stress test=normal  . 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 Chronic kidney disease    GFR 58, mild  . Edema    maxide prn  . Environmental allergies   . GERD (gastroesophageal reflux disease)   . H/O measles   . H/O mumps   . Hypercholesterolemia   . PONV (postoperative nausea and vomiting)      Physical Exam: General: The patient is alert and oriented x3 in no acute distress.  Dermatology: Skin is warm, dry and supple bilateral lower extremities. Negative for open lesions or macerations.  Vascular: Palpable pedal pulses bilaterally. No edema or erythema noted. Capillary refill within normal limits.  Neurological: Epicritic and protective threshold grossly intact bilaterally.   Musculoskeletal Exam: Range of motion within normal limits to all pedal and ankle joints bilateral. Muscle strength 5/5 in all groups bilateral.   Radiographic Exam:  Normal osseous mineralization. Joint spaces preserved. No fracture/dislocation/boney destruction.  Pain on palpation and range of motion noted to the second MTPJ of the right foot  Assessment: 1.  Metatarsophalangeal capsulitis second MTPJ right   Plan of Care:  1. Patient evaluated. X-Rays reviewed.  2.  Prescription for Medrol Dosepak.  Patient cannot take NSAIDs secondary to CKD 3.  Injection of 0.5 cc Celestone Soluspan injected into the second MTPJ right 4.  Continue wearing good supportive new balance  walking shoes from [employer] 5.  Return to clinic in 4 weeks      Edrick Kins, DPM [facility]  Dr. Edrick Kins, DPM    [address], [place] [address]                Office [phone]  Fax [phone]

## 2020-08-08 ENCOUNTER — Ambulatory Visit (INDEPENDENT_AMBULATORY_CARE_PROVIDER_SITE_OTHER): Payer: Medicare Other | Admitting: Podiatry

## 2020-08-08 ENCOUNTER — Other Ambulatory Visit: Payer: Self-pay

## 2020-08-08 DIAGNOSIS — G5761 Lesion of plantar nerve, right lower limb: Secondary | ICD-10-CM

## 2020-08-08 DIAGNOSIS — M7751 Other enthesopathy of right foot: Secondary | ICD-10-CM

## 2020-08-08 NOTE — Progress Notes (Signed)
° °  HPI: 69 y.o. female presenting today for follow-up evaluation of second MTPJ capsulitis of the right foot.  Patient states that the pain in the sensation of a knot on the bottom of her toe has alleviated.  She feels much better.  She continues to have some pain to the lateral aspect of the forefoot however.  She took the prednisone pack as directed.  She continues to wear good supportive new balance shoes from [employer] running store.  No new complaints at this time  Past Medical History:  Diagnosis Date   Arthritis    bilateral knees and hands   Asthma    allergy related, no attacks   Atypical chest pain 02/2014   april stress test=normal   Atypical lobular hyperplasia of breast 09/2002   right   Chronic kidney disease    GFR 58, mild   Edema    maxide prn   Environmental allergies    GERD (gastroesophageal reflux disease)    H/O measles    H/O mumps    Hypercholesterolemia    PONV (postoperative nausea and vomiting)      Physical Exam: General: The patient is alert and oriented x3 in no acute distress.  Dermatology: Skin is warm, dry and supple bilateral lower extremities. Negative for open lesions or macerations.  Vascular: Palpable pedal pulses bilaterally. No edema or erythema noted. Capillary refill within normal limits.  Neurological: Epicritic and protective threshold grossly intact bilaterally.   Musculoskeletal Exam: Range of motion within normal limits to all pedal and ankle joints bilateral. Muscle strength 5/5 in all groups bilateral.  Negative pain on palpation and range of motion to the second MTPJ of the right foot.  Today there is some pain on palpation to the third intermetatarsal space of the right foot consistent with findings of a Morton's neuroma.  Pain with lateral compression of metatarsal heads.   Assessment: 1.  Second MTPJ capsulitis right-resolved 2.  Morton's neuroma third intermetatarsal space right   Plan of Care:  1. Patient  evaluated.  2.  Injection of 0.5 cc Celestone Soluspan injected into the third intermetatarsal space right foot 3.  Continue OTC Tylenol as needed.  Patient cannot take NSAIDs secondary to CKD 4.  Continue good supportive new balance shoes from [employer] 5.  Return to clinic as needed      Edrick Kins, DPM [facility]  Dr. Edrick Kins, DPM    [address], [place] [address]                Office [phone]  Fax [phone]

## 2020-08-31 DIAGNOSIS — Z23 Encounter for immunization: Secondary | ICD-10-CM | POA: Diagnosis not present

## 2020-09-05 NOTE — Progress Notes (Signed)
Diane Fox Date of Birth: November 03, 1951 Medical Record #[id number]  History of Present Illness: Seen today for followup hypercholesterolemia and coronary calcification. She has a history of hypercholesterolemia and is on Crestor therapy.  She had a normal stress Echo in April 2015. 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   In 2019 she complained of DOE. She has a chronic cough with extensive pulmonary evaluation before. Stress Echo in June 2019 was normal. Prior CT chest in 2016 was normal except for some coronary calcification.   On follow up today she is staying  active. She walks daily. She denies any chest pain. She has the same dyspnea going up stairs that she has had for years. Has a foot problem now and is seeing a podiatrist.     Current Outpatient Medications on File Prior to Visit  Medication Sig Dispense Refill  . Calcium Carbonate (CALCIUM 600 PO) Take by mouth.    . chlorpheniramine (CHLOR-TRIMETON) 4 MG tablet Take 4 mg by mouth daily as needed for allergies.     . cholecalciferol (VITAMIN D) 1000 units tablet Take 1,000 Units by mouth daily.    . Coenzyme Q10 (CO Q 10 PO) Take 200 mg by mouth daily.    . fexofenadine (ALLEGRA) 180 MG tablet Take 180 mg by mouth daily.    Marland Kitchen losartan (COZAAR) 25 MG tablet     . Melatonin 1 MG CAPS daily as needed.    . Multiple Vitamins-Minerals (CENTRUM SILVER PO) Take by mouth.    Marland Kitchen omeprazole (PRILOSEC) 20 MG capsule Take 20 mg by mouth daily.    . rosuvastatin (CRESTOR) 10 MG tablet TAKE 1 TABLET BY MOUTH  DAILY 90 tablet 3  . triamcinolone (NASACORT ALLERGY 24HR) 55 MCG/ACT AERO nasal inhaler Place 2 sprays into the nose daily.    Marland Kitchen triamterene-hydrochlorothiazide (MAXZIDE) 75-50 MG per tablet Take 1 tablet by mouth every morning.     . vitamin C (ASCORBIC ACID) 500 MG tablet Take 500 mg by mouth daily.    . vitamin E 400 UNIT capsule Take 400 Units by mouth daily.     No current facility-administered medications on file prior  to visit.    Allergies  Allergen Reactions  . Biaxin [Clarithromycin] Hives  . Penicillins Hives    Has patient had a PCN reaction causing immediate rash, facial/tongue/throat swelling, SOB or lightheadedness with hypotension: No Has patient had a PCN reaction causing severe rash involving mucus membranes or skin necrosis: No Has patient had a PCN reaction that required hospitalization: No Has patient had a PCN reaction occurring within the last 10 years: Yes If all of the above answers are "NO", then may proceed with Cephalosporin use.   . Sulfa Antibiotics Hives  . Zostavax [Zoster Vaccine Live] Hives    Past Medical History:  Diagnosis Date  . Arthritis    bilateral knees and hands  . Asthma    allergy related, no attacks  . Atypical chest pain 02/2014   april stress test=normal  . 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 Chronic kidney disease    GFR 58, mild  . Edema    maxide prn  . Environmental allergies   . GERD (gastroesophageal reflux disease)   . H/O measles   . H/O mumps   . Hypercholesterolemia   . PONV (postoperative nausea and vomiting)     Past Surgical History:  Procedure Laterality Date  . BREAST BIOPSY Right 09/2002  focal atypical lobular hyperplasia  . BREAST SURGERY Right 10/03   focal atypia hyperplasia  . DILATION AND CURETTAGE OF UTERUS     x2  . HYSTEROSCOPY WITH D & C  07/2007   polyp removed-benign  . LIPOMA EXCISION Right 01/15/2019   Procedure: EXCISION RIGHT BACK LIPOMA;  Surgeon: Wallace Going, DO;  Location: [facility];  Service: Plastics;  Laterality: Right;  . PATELLA-FEMORAL ARTHROPLASTY Left 11/23/2013   Procedure: LEFT KNEE PATELLA-FEMORAL ARTHROPLASTY;  Surgeon: Gearlean Alf, MD;  Location: [facility];  Service: Orthopedics;  Laterality: Left;  . RIGHT OOPHORECTOMY Right 11/91   tubal ligation with complications  . TOTAL KNEE ARTHROPLASTY Left 07/05/2014   Procedure: LEFT TOTAL KNEE  ARTHROPLASTY;  Surgeon: Gearlean Alf, MD;  Location: [facility];  Service: Orthopedics;  Laterality: Left;  . TOTAL KNEE ARTHROPLASTY Right 11/11/2017   Procedure: RIGHT TOTAL KNEE ARTHROPLASTY;  Surgeon: Gaynelle Arabian, MD;  Location: [facility];  Service: Orthopedics;  Laterality: Right;  with abductor block  . TUBAL LIGATION  1991  . WISDOM TOOTH EXTRACTION  early 20's    Social History   Tobacco Use  Smoking Status Never Smoker  Smokeless Tobacco Never Used    Social History   Substance and Sexual Activity  Alcohol Use Yes  . Alcohol/week: 1.0 standard drink  . Types: 1 Glasses of wine per week   Comment: social    Family History  Problem Relation Age of Onset  . Heart disease Father   . Heart attack Father   . Hypertension Father   . Heart disease Mother   . Hypertension Mother   . Asthma Brother   . Cancer Maternal Grandmother        lymphoma  . Cancer Maternal Grandfather        prostate cancer    Review of Systems: As noted in history of present illness. Positive for cough.  All other systems were reviewed and are negative.  Physical Exam: BP 120/82   Pulse 64   Ht 5\' 3"  (1.6 m)   Wt 185 lb 12.8 oz (84.3 kg)   LMP 10/27/2012   SpO2 97%   BMI 32.91 kg/m  GENERAL:  Well appearing WF in NAD HEENT:  PERRL, EOMI, sclera are clear. Oropharynx is clear. NECK:  No jugular venous distention, carotid upstroke brisk and symmetric, no bruits, no thyromegaly or adenopathy LUNGS:  Clear to auscultation bilaterally CHEST:  Unremarkable HEART:  RRR,  PMI not displaced or sustained,S1 and S2 within normal limits, no S3, no S4: no clicks, no rubs, no murmurs ABD:  Soft, nontender. BS +, no masses or bruits. No hepatomegaly, no splenomegaly EXT:  2 + pulses throughout, no edema, no cyanosis no clubbing SKIN:  Warm and dry.  No rashes NEURO:  Alert and oriented x 3. Cranial nerves II through XII intact. PSYCH:  Cognitively intact   LABORATORY DATA:  Lab Results   Component Value Date   WBC 11.6 (H) 11/14/2017   HGB 12.2 11/14/2017   HCT 36.6 11/14/2017   PLT 320 11/14/2017   GLUCOSE 95 01/12/2019   CHOL 158 09/02/2018   TRIG 139 09/02/2018   HDL 59 09/02/2018   LDLCALC 71 09/02/2018   ALT 28 09/02/2018   AST 19 09/02/2018   NA 137 01/12/2019   K 3.9 01/12/2019   CL 101 01/12/2019   CREATININE 0.96 01/12/2019   BUN 19 01/12/2019   CO2 26 01/12/2019   INR 0.94 11/04/2017  Labs dated 05/01/17 reviewed: Normal  Chemistry panel and TSH. Cholesterol- 176, trig- 129, HDL- 61, LDL 89.  Dated 05/14/18: cholesterol 196, triglycerides 158, HDL 65, LDL 100. Chemistries and CBC normal. Dated 06/17/19: normal CBC and LFTs. Normal TSH Dated 08/27/19: cholesterol 165, triglycerides 153, HDL 60, LDL 75. Normal BMET Dated 03/30/20: cholesterol 161, triglycerides 168, HDL 64, LDL 69. CMET normal   Ecg today shows NSR with rate 64. Nonspecific ST T wave abnormality. in the precordial leads. I have personally reviewed and interpreted this study.  Stress Echo 05/28/18: Study Conclusions  - Stress ECG conclusions: There were no stress arrhythmias or   conduction abnormalities. The stress ECG was negative for   ischemia. - Staged echo: There was no echocardiographic evidence for   stress-induced ischemia.  Impressions:  - Normal hyperdynamic response to exercise. Normal stress   echocardiogram. Good exrcise capacity. Normal BP response to   exercise.  Assessment / Plan:  1. Hypercholesterolemia. Good control on Crestor with LDL 69.   Encourage continued  lifestyle modifications.    2. Dyspnea on exertion. No change.  Negative pulmonary evaluation in the past. Normal stress Echo 2019.   3. HTN. Well controlled on current medication  4. Coronary calcification noted on CT 2016.   Follow up in one year.

## 2020-09-09 ENCOUNTER — Encounter: Payer: Self-pay | Admitting: Cardiology

## 2020-09-09 ENCOUNTER — Ambulatory Visit (INDEPENDENT_AMBULATORY_CARE_PROVIDER_SITE_OTHER): Payer: Medicare Other | Admitting: Cardiology

## 2020-09-09 ENCOUNTER — Other Ambulatory Visit: Payer: Self-pay

## 2020-09-09 VITALS — BP 120/82 | HR 64 | Ht 63.0 in | Wt 185.8 lb

## 2020-09-09 DIAGNOSIS — I10 Essential (primary) hypertension: Secondary | ICD-10-CM

## 2020-09-09 DIAGNOSIS — I2510 Atherosclerotic heart disease of native coronary artery without angina pectoris: Secondary | ICD-10-CM

## 2020-09-09 DIAGNOSIS — E7800 Pure hypercholesterolemia, unspecified: Secondary | ICD-10-CM | POA: Diagnosis not present

## 2020-09-19 ENCOUNTER — Encounter: Payer: Self-pay | Admitting: Podiatry

## 2020-09-19 ENCOUNTER — Ambulatory Visit: Payer: Medicare Other

## 2020-09-19 ENCOUNTER — Ambulatory Visit (INDEPENDENT_AMBULATORY_CARE_PROVIDER_SITE_OTHER): Payer: Medicare Other | Admitting: Podiatry

## 2020-09-19 ENCOUNTER — Ambulatory Visit (INDEPENDENT_AMBULATORY_CARE_PROVIDER_SITE_OTHER): Payer: Medicare Other

## 2020-09-19 ENCOUNTER — Other Ambulatory Visit: Payer: Self-pay

## 2020-09-19 DIAGNOSIS — M7751 Other enthesopathy of right foot: Secondary | ICD-10-CM

## 2020-09-19 DIAGNOSIS — M7752 Other enthesopathy of left foot: Secondary | ICD-10-CM

## 2020-09-19 NOTE — Progress Notes (Signed)
   HPI: 69 y.o. female presenting today for evaluation of severe right second toe pain.  Patient was last seen in the office on 08/08/2020 at which time an injection was administered to the second toe.  Patient felt much better however over the past month the pain slowly recurred.  Past Medical History:  Diagnosis Date  . Arthritis    bilateral knees and hands  . Asthma    allergy related, no attacks  . Atypical chest pain 02/2014   april stress test=normal  . 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 Chronic kidney disease    GFR 58, mild  . Edema    maxide prn  . Environmental allergies   . GERD (gastroesophageal reflux disease)   . H/O measles   . H/O mumps   . Hypercholesterolemia   . PONV (postoperative nausea and vomiting)      Physical Exam: General: The patient is alert and oriented x3 in no acute distress.  Dermatology: Skin is warm, dry and supple bilateral lower extremities. Negative for open lesions or macerations.  Vascular: Palpable pedal pulses bilaterally. No edema or erythema noted. Capillary refill within normal limits.  Neurological: Epicritic and protective threshold grossly intact bilaterally.   Musculoskeletal Exam: Range of motion within normal limits to all pedal and ankle joints bilateral. Muscle strength 5/5 in all groups bilateral.  Pain and tenderness to palpation of the second MTPJ.  Radiographic Exam:  Normal osseous mineralization. No fracture/dislocation/boney destruction.  Dorsal dislocation of the second MTPJ noted.  The proximal phalanx is actually sitting dorsal and on top of the metatarsal head of the second metatarsal. Post reduction x-rays do demonstrate better alignment however the toe still sitting dorsal in relationship to the second metatarsal head There is also evidence of a hallux valgus deformity with increased intermetatarsal angle and a prominent first metatarsal with an increased hallux abductus  angle  Assessment: 1.  Second MTPJ capsulitis right 2.  Dorsally dislocated second MTPJ right   Plan of Care:  1. Patient evaluated. X-Rays reviewed.  2.  Injection of 0.5 cc Celestone Soluspan injected into the second MTPJ in combination with 1 mL of 2% lidocaine plain 3.  Post reduction of the dislocation was performed and a DARCO toe alignment splint was applied to keep the toe in a plantarflexed position 4.  Postsurgical shoe dispensed.  Wear daily 5.  Return to clinic in 3 weeks.  If the toe is still dorsally displaced and popping out of joint we may need to consider surgical intervention at this time.  Patient states that if we do need to have surgery will go ahead and correct her bunion deformity as well      Edrick Kins, DPM [facility]  Dr. Edrick Kins, DPM    [address], [place] [address]                Office [phone]  Fax [phone]

## 2020-09-23 ENCOUNTER — Telehealth: Payer: Self-pay | Admitting: Podiatry

## 2020-09-23 NOTE — Telephone Encounter (Signed)
Pt received a splint for her dislocated toe. She would like to know how long she needs to keep it on daily. Please advise.

## 2020-09-23 NOTE — Telephone Encounter (Signed)
Spoke with patient on the phone and answered all questions... Wear toe splint day and night.  Wear postsurgical shoe during the day.  She has a follow-up appointment first week of November.  Thanks, Dr. Amalia Hailey

## 2020-09-26 DIAGNOSIS — E782 Mixed hyperlipidemia: Secondary | ICD-10-CM | POA: Diagnosis not present

## 2020-09-26 DIAGNOSIS — N189 Chronic kidney disease, unspecified: Secondary | ICD-10-CM | POA: Diagnosis not present

## 2020-09-26 DIAGNOSIS — Z23 Encounter for immunization: Secondary | ICD-10-CM | POA: Diagnosis not present

## 2020-09-26 DIAGNOSIS — S93104A Unspecified dislocation of right toe(s), initial encounter: Secondary | ICD-10-CM | POA: Diagnosis not present

## 2020-09-26 DIAGNOSIS — M8580 Other specified disorders of bone density and structure, unspecified site: Secondary | ICD-10-CM | POA: Diagnosis not present

## 2020-09-26 DIAGNOSIS — I10 Essential (primary) hypertension: Secondary | ICD-10-CM | POA: Diagnosis not present

## 2020-09-27 DIAGNOSIS — H11153 Pinguecula, bilateral: Secondary | ICD-10-CM | POA: Diagnosis not present

## 2020-09-27 DIAGNOSIS — H2513 Age-related nuclear cataract, bilateral: Secondary | ICD-10-CM | POA: Diagnosis not present

## 2020-09-27 DIAGNOSIS — H52223 Regular astigmatism, bilateral: Secondary | ICD-10-CM | POA: Diagnosis not present

## 2020-09-27 DIAGNOSIS — H43811 Vitreous degeneration, right eye: Secondary | ICD-10-CM | POA: Diagnosis not present

## 2020-09-27 DIAGNOSIS — H5213 Myopia, bilateral: Secondary | ICD-10-CM | POA: Diagnosis not present

## 2020-09-27 DIAGNOSIS — H04123 Dry eye syndrome of bilateral lacrimal glands: Secondary | ICD-10-CM | POA: Diagnosis not present

## 2020-09-27 DIAGNOSIS — H524 Presbyopia: Secondary | ICD-10-CM | POA: Diagnosis not present

## 2020-10-03 DIAGNOSIS — M21611 Bunion of right foot: Secondary | ICD-10-CM | POA: Diagnosis not present

## 2020-10-03 DIAGNOSIS — M205X1 Other deformities of toe(s) (acquired), right foot: Secondary | ICD-10-CM | POA: Diagnosis not present

## 2020-10-13 DIAGNOSIS — E782 Mixed hyperlipidemia: Secondary | ICD-10-CM | POA: Diagnosis not present

## 2020-10-13 DIAGNOSIS — E785 Hyperlipidemia, unspecified: Secondary | ICD-10-CM | POA: Diagnosis not present

## 2020-10-13 DIAGNOSIS — M8580 Other specified disorders of bone density and structure, unspecified site: Secondary | ICD-10-CM | POA: Diagnosis not present

## 2020-10-13 DIAGNOSIS — I10 Essential (primary) hypertension: Secondary | ICD-10-CM | POA: Diagnosis not present

## 2020-10-13 DIAGNOSIS — N189 Chronic kidney disease, unspecified: Secondary | ICD-10-CM | POA: Diagnosis not present

## 2020-10-17 ENCOUNTER — Ambulatory Visit: Payer: Medicare Other | Admitting: Podiatry

## 2020-10-24 DIAGNOSIS — Z23 Encounter for immunization: Secondary | ICD-10-CM | POA: Diagnosis not present

## 2020-11-17 DIAGNOSIS — M25551 Pain in right hip: Secondary | ICD-10-CM | POA: Diagnosis not present

## 2020-11-17 DIAGNOSIS — Z96653 Presence of artificial knee joint, bilateral: Secondary | ICD-10-CM | POA: Diagnosis not present

## 2020-11-23 DIAGNOSIS — M21611 Bunion of right foot: Secondary | ICD-10-CM | POA: Diagnosis not present

## 2020-11-24 DIAGNOSIS — L570 Actinic keratosis: Secondary | ICD-10-CM | POA: Diagnosis not present

## 2020-11-24 DIAGNOSIS — D2239 Melanocytic nevi of other parts of face: Secondary | ICD-10-CM | POA: Diagnosis not present

## 2020-12-20 DIAGNOSIS — S93124A Dislocation of metatarsophalangeal joint of right lesser toe(s), initial encounter: Secondary | ICD-10-CM | POA: Diagnosis not present

## 2020-12-20 DIAGNOSIS — M7741 Metatarsalgia, right foot: Secondary | ICD-10-CM | POA: Diagnosis not present

## 2020-12-20 DIAGNOSIS — M2041 Other hammer toe(s) (acquired), right foot: Secondary | ICD-10-CM | POA: Diagnosis not present

## 2020-12-20 DIAGNOSIS — M21531 Acquired clawfoot, right foot: Secondary | ICD-10-CM | POA: Diagnosis not present

## 2020-12-20 DIAGNOSIS — M21611 Bunion of right foot: Secondary | ICD-10-CM | POA: Diagnosis not present

## 2020-12-20 DIAGNOSIS — Y999 Unspecified external cause status: Secondary | ICD-10-CM | POA: Diagnosis not present

## 2020-12-20 DIAGNOSIS — G8918 Other acute postprocedural pain: Secondary | ICD-10-CM | POA: Diagnosis not present

## 2020-12-20 DIAGNOSIS — X58XXXA Exposure to other specified factors, initial encounter: Secondary | ICD-10-CM | POA: Diagnosis not present

## 2021-01-05 DIAGNOSIS — Z4889 Encounter for other specified surgical aftercare: Secondary | ICD-10-CM | POA: Diagnosis not present

## 2021-01-05 DIAGNOSIS — M21611 Bunion of right foot: Secondary | ICD-10-CM | POA: Diagnosis not present

## 2021-02-02 DIAGNOSIS — Z4889 Encounter for other specified surgical aftercare: Secondary | ICD-10-CM | POA: Diagnosis not present

## 2021-02-02 DIAGNOSIS — M21611 Bunion of right foot: Secondary | ICD-10-CM | POA: Diagnosis not present

## 2021-03-02 DIAGNOSIS — M21611 Bunion of right foot: Secondary | ICD-10-CM | POA: Diagnosis not present

## 2021-03-06 DIAGNOSIS — N390 Urinary tract infection, site not specified: Secondary | ICD-10-CM | POA: Diagnosis not present

## 2021-03-06 DIAGNOSIS — R309 Painful micturition, unspecified: Secondary | ICD-10-CM | POA: Diagnosis not present

## 2021-03-06 DIAGNOSIS — R319 Hematuria, unspecified: Secondary | ICD-10-CM | POA: Diagnosis not present

## 2021-03-20 DIAGNOSIS — R310 Gross hematuria: Secondary | ICD-10-CM | POA: Diagnosis not present

## 2021-03-20 DIAGNOSIS — R300 Dysuria: Secondary | ICD-10-CM | POA: Diagnosis not present

## 2021-03-21 ENCOUNTER — Ambulatory Visit
Admission: RE | Admit: 2021-03-21 | Discharge: 2021-03-21 | Disposition: A | Discharge status: Home / Self Care | Payer: Medicare Other | Source: Ambulatory Visit | Attending: Physician Assistant | Admitting: Physician Assistant

## 2021-03-21 ENCOUNTER — Other Ambulatory Visit: Payer: Self-pay | Admitting: Physician Assistant

## 2021-03-21 DIAGNOSIS — R310 Gross hematuria: Secondary | ICD-10-CM

## 2021-03-21 DIAGNOSIS — R319 Hematuria, unspecified: Secondary | ICD-10-CM | POA: Diagnosis not present

## 2021-03-30 DIAGNOSIS — M21611 Bunion of right foot: Secondary | ICD-10-CM | POA: Diagnosis not present

## 2021-03-30 DIAGNOSIS — M205X1 Other deformities of toe(s) (acquired), right foot: Secondary | ICD-10-CM | POA: Diagnosis not present

## 2021-04-04 ENCOUNTER — Other Ambulatory Visit: Payer: Medicare Other

## 2021-04-04 DIAGNOSIS — E785 Hyperlipidemia, unspecified: Secondary | ICD-10-CM | POA: Diagnosis not present

## 2021-04-04 DIAGNOSIS — D72829 Elevated white blood cell count, unspecified: Secondary | ICD-10-CM | POA: Diagnosis not present

## 2021-04-04 DIAGNOSIS — R300 Dysuria: Secondary | ICD-10-CM | POA: Diagnosis not present

## 2021-04-04 DIAGNOSIS — I10 Essential (primary) hypertension: Secondary | ICD-10-CM | POA: Diagnosis not present

## 2021-04-04 DIAGNOSIS — N3000 Acute cystitis without hematuria: Secondary | ICD-10-CM | POA: Diagnosis not present

## 2021-04-04 DIAGNOSIS — R739 Hyperglycemia, unspecified: Secondary | ICD-10-CM | POA: Diagnosis not present

## 2021-04-04 DIAGNOSIS — I700 Atherosclerosis of aorta: Secondary | ICD-10-CM | POA: Diagnosis not present

## 2021-04-04 DIAGNOSIS — Z0000 Encounter for general adult medical examination without abnormal findings: Secondary | ICD-10-CM | POA: Diagnosis not present

## 2021-04-04 DIAGNOSIS — Z1211 Encounter for screening for malignant neoplasm of colon: Secondary | ICD-10-CM | POA: Diagnosis not present

## 2021-04-05 ENCOUNTER — Other Ambulatory Visit: Payer: Self-pay

## 2021-04-05 ENCOUNTER — Ambulatory Visit: Payer: Medicare Other | Attending: Student

## 2021-04-05 DIAGNOSIS — M25674 Stiffness of right foot, not elsewhere classified: Secondary | ICD-10-CM | POA: Diagnosis not present

## 2021-04-05 DIAGNOSIS — R262 Difficulty in walking, not elsewhere classified: Secondary | ICD-10-CM | POA: Diagnosis not present

## 2021-04-05 DIAGNOSIS — M79671 Pain in right foot: Secondary | ICD-10-CM | POA: Insufficient documentation

## 2021-04-05 DIAGNOSIS — Z9889 Other specified postprocedural states: Secondary | ICD-10-CM

## 2021-04-05 NOTE — Therapy (Signed)
[place], [place], [address] Phone: [phone]   Fax:  [phone]  Physical Therapy Evaluation  Patient Details  Name: Diane Fox MRN: [id number] Date of Birth: 12-21-1950 Referring Provider (PT): Corky Sing, [place]   Encounter Date: 04/05/2021   PT End of Session - 04/05/21 1059    Visit Number 1    Number of Visits 13    Date for PT Re-Evaluation 06/03/21    Authorization Type MCR    Progress Note Due on Visit 10    PT Start Time 1017    PT Stop Time 1100    PT Time Calculation (min) 43 min    Activity Tolerance Patient tolerated treatment well    Behavior During Therapy [facility] for tasks assessed/performed           Past Medical History:  Diagnosis Date  . Arthritis    bilateral knees and hands  . Asthma    allergy related, no attacks  . Atypical chest pain 02/2014   april stress test=normal  . 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 Chronic kidney disease    GFR 58, mild  . Edema    maxide prn  . Environmental allergies   . GERD (gastroesophageal reflux disease)   . H/O measles   . H/O mumps   . Hypercholesterolemia   . PONV (postoperative nausea and vomiting)     Past Surgical History:  Procedure Laterality Date  . BREAST BIOPSY Right 09/2002   focal atypical lobular hyperplasia  . BREAST SURGERY Right 10/03   focal atypia hyperplasia  . DILATION AND CURETTAGE OF UTERUS     x2  . HYSTEROSCOPY WITH D & C  07/2007   polyp removed-benign  . LIPOMA EXCISION Right 01/15/2019   Procedure: EXCISION RIGHT BACK LIPOMA;  Surgeon: Wallace Going, DO;  Location: [facility];  Service: Plastics;  Laterality: Right;  . PATELLA-FEMORAL ARTHROPLASTY Left 11/23/2013   Procedure: LEFT KNEE PATELLA-FEMORAL ARTHROPLASTY;  Surgeon: Gearlean Alf, MD;  Location: [facility];  Service: Orthopedics;  Laterality: Left;  . RIGHT OOPHORECTOMY Right 11/91    tubal ligation with complications  . TOTAL KNEE ARTHROPLASTY Left 07/05/2014   Procedure: LEFT TOTAL KNEE ARTHROPLASTY;  Surgeon: Gearlean Alf, MD;  Location[facility];  Service: Orthopedics;  Laterality: Left;  . TOTAL KNEE ARTHROPLASTY Right 11/11/2017   Procedure: RIGHT TOTAL KNEE ARTHROPLASTY;  Surgeon: Gaynelle Arabian, MD;  Location[facility];  Service: Orthopedics;  Laterality: Right;  with abductor block  . TUBAL LIGATION  1991  . WISDOM TOOTH EXTRACTION  early 20's    There were no vitals filed for this visit.    Subjective Assessment - 04/05/21 1017    Subjective Patient had surgery on 12/20/20 for a bunion and dislocated second toe on the Rt. "I at least can walk, but I feel like I'm not walking correctly and it takes me longer to walk. I walk around the block by the time I get home my hips and knees hurt. I have had both knees replaced. When I take my shoes off my big toe really hurts. Walking down the steps is less painful, but it's still noticeable. I'm not having my other bunion done." Patient has f/u with referring provider in 6 weeks. Patient denies pain currently, but increases to 2/10 with walking and stairs in her foot and bilateral knees/hips and pain in her great toe when taking  off her shoes.    Pertinent History Lt TKA 2015, Rt TKA 2018    Limitations Standing;Walking    How long can you stand comfortably? not long probably 30 minutes    How long can you walk comfortably? 30 minutes    Patient Stated Goals I want to be able to increase the pace of my walk and to not have negative impact on my other joints    Currently in Pain? No/denies              [facility] PT Assessment - 04/05/21 0001      Assessment   Medical Diagnosis Bunion of right foot    Referring Provider (PT) Corky Sing, PA-C    Onset Date/Surgical Date 12/20/20    Hand Dominance Right    Next MD Visit 6 weeks    Prior Therapy nothing for foot, PT following TKA      Precautions   Precautions None       Restrictions   Weight Bearing Restrictions No      Balance Screen   Has the patient fallen in the past 6 months No      [place] residence    Living Arrangements Spouse/significant other    Additional Comments stairs inside      Prior Function   Level of [place] Retired    Secondary school teacher, puzzles      Cognition   Overall Cognitive Status Within Functional Limits for tasks assessed      Observation/Other Assessments   Observations incision site well healed about Rt foot    Focus on Therapeutic Outcomes (FOTO)  62% function to 70% predicted      Sensation   Light Touch Not tested      Coordination   Gross Motor Movements are Fluid and Coordinated Yes      AROM   Overall AROM Comments Ankle PF,Inversion,Eversion WNL Rt; Knee AROM WNL bilaterally; limited toe flexion/extension Rt foot    Right Ankle Dorsiflexion 5    Left Ankle Dorsiflexion 11      PROM   Overall PROM Comments great toe extension 10 degrees Rt 35 degrees Lt      Strength   Overall Strength Comments gross hip strength 4/5 bilaterally; ankle strength 5/5 bilaterally    Right Knee Flexion 5/5    Right Knee Extension 5/5    Left Knee Flexion 5/5    Left Knee Extension 5/5      Palpation   Palpation comment TTP along scar      Ambulation/Gait   Gait Comments excessive frontal plane movement, foot flat initial contact RLE, decreased push-off      6 minute walk test results    Aerobic Endurance Distance Walked 1088                      Objective measurements completed on examination: See above findings.       [facility] Adult PT Treatment/Exercise - 04/05/21 0001      Self-Care   Self-Care Other Self-Care Comments    Other Self-Care Comments  see patient education                  PT Education - 04/05/21 1301    Education Details Education on current condition, POC, and HEP.    Person(s) Educated  Patient    Methods Explanation;Demonstration;Tactile cues;Verbal cues;Handout    Comprehension Verbalized understanding;Returned demonstration;Verbal  cues required            PT Short Term Goals - 04/05/21 1236      PT SHORT TERM GOAL #1   Title Patient will be independent with initial HEP.    Baseline issued at eval.    Time 2    Period Weeks    Status New    Target Date 04/19/21      PT SHORT TERM GOAL #2   Title Therapist will review FOTO and anticipated functional progress.    Baseline FOTO captured at eval.    Time 2    Period Weeks    Status New    Target Date 04/19/21      PT SHORT TERM GOAL #3   Title Patient will demonstrate at least 10 degrees of Rt ankle dorsiflexion to improve overall gait mechanics.    Baseline see flowsheet    Time 4    Period Weeks    Status New    Target Date 05/03/21      PT SHORT TERM GOAL #4   Title Patient will demonstrate at least 20 degrees of passive great toe extension to reduce stress about the foot when donning/doffing her shoes.    Baseline 10 degrees; increaesed pain about the great toe when taking her shoes off    Time 4    Period Weeks    Status New    Target Date 05/03/21             PT Long Term Goals - 04/05/21 1255      PT LONG TERM GOAL #1   Title Patient will walk at least 1400 ft during 6MWT to signify improvements in her overall pace of walking.    Baseline 1088 ft    Time 8    Period Weeks    Status New      PT LONG TERM GOAL #2   Title Patient will demonstrate 5/5 hip strength to improve stability about the chain for prolonged walking activity.    Baseline see flowsheet    Time 8    Period Weeks    Status New    Target Date 05/31/21      PT LONG TERM GOAL #3   Title Patient will score at least 70% on FOTO to signify clinically meaningful improvement in functional abilities.    Baseline see flowsheet    Time 8    Period Weeks    Status New    Target Date 05/31/21      PT LONG TERM GOAL #4    Title Therapist will assess balance at next visit and set appropriate LTG.    Baseline n/a                  Plan - 04/05/21 1104    Clinical Impression Statement Patient is a 70 y/o female who presents to OPPT s/p Rt bunionectomy on 12/20/20. Her chief complaint is pain about the Rt foot and bilateral hips/knees with prolonged walking and standing and pain about the great toe when doffing her shoes. She reports prior to her surgery she did not experience hip/knee pain with her normal walking routine. She currently presents with limited Rt ankle dorsiflexion and digit ROM, gait abnormalities, hip weakness, and decreased activity tolerance secondary to pain. She should benefit from skilled PT to address above stated deficits in order to optimize function and improve her tolerance to recreational activities.    Personal Factors and Comorbidities Age;Comorbidity 2;Time  since onset of injury/illness/exacerbation    Examination-Activity Limitations Stairs;Stand;Locomotion Level    Examination-Participation Restrictions Other   exercise routine   Stability/Clinical Decision Making Stable/Uncomplicated    Clinical Decision Making Low    Rehab Potential Good    PT Frequency 2x / week   twice a week for 4 weeks then potentially dropping to once a week for 4 additional weeks.   PT Duration 8 weeks    PT Treatment/Interventions ADLs/Self Care Home Management;Aquatic Therapy;Cryotherapy;Moist Heat;Gait training;Stair training;Therapeutic activities;Therapeutic exercise;Balance training;Neuromuscular re-education;Patient/family education;Manual techniques;Passive range of motion;Dry needling;Taping;Vasopneumatic Device    PT Next Visit Plan assess balance (SLS?), review FOTO, review HEP, scar tissue mobilization, foot intrinsic strengthening, hip strengthening    PT Home Exercise Plan Access Code: [email or web address]    Consulted and Agree with Plan of Care Patient           Patient will benefit from skilled  therapeutic intervention in order to improve the following deficits and impairments:  Abnormal gait,Decreased range of motion,Difficulty walking,Decreased activity tolerance,Pain,Impaired flexibility,Decreased strength  Visit Diagnosis: S/P foot surgery  Pain in right foot  Difficulty in walking, not elsewhere classified  Stiffness of right foot, not elsewhere classified     Problem List Patient Active Problem List   Diagnosis Date Noted  . Lipoma of back 12/23/2018  . Chronic kidney disease 09/26/2018  . OA (osteoarthritis) of knee 11/23/2013  . Chronic cough 01/23/2013  . Hypercholesterolemia 11/05/2011   Gwendolyn Grant, PT, DPT, ATC 04/05/21 4:50 PM  [facility] [address] [place], [place], [address] Phone: [phone]   Fax:  [phone]  Name: Diane Fox MRN: [id number] Date of Birth: [birth year]

## 2021-04-12 ENCOUNTER — Ambulatory Visit: Payer: Medicare Other

## 2021-04-12 ENCOUNTER — Other Ambulatory Visit: Payer: Self-pay

## 2021-04-12 DIAGNOSIS — R262 Difficulty in walking, not elsewhere classified: Secondary | ICD-10-CM

## 2021-04-12 DIAGNOSIS — Z9889 Other specified postprocedural states: Secondary | ICD-10-CM

## 2021-04-12 DIAGNOSIS — M25674 Stiffness of right foot, not elsewhere classified: Secondary | ICD-10-CM | POA: Diagnosis not present

## 2021-04-12 DIAGNOSIS — M79671 Pain in right foot: Secondary | ICD-10-CM | POA: Diagnosis not present

## 2021-04-12 NOTE — Therapy (Signed)
[place], [place], [address] Phone: [phone]   Fax:  [phone]  Physical Therapy Treatment  Patient Details  Name: Diane Fox MRN: [id number] Date of Birth: 19-Sep-1951 Referring Provider (PT): Corky Sing, [place]   Encounter Date: 04/12/2021   PT End of Session - 04/12/21 1350    Visit Number 2    Number of Visits 13    Date for PT Re-Evaluation 06/03/21    Authorization Type MCR    Progress Note Due on Visit 10    PT Start Time 9937    PT Stop Time 1400    PT Time Calculation (min) 42 min    Activity Tolerance Patient tolerated treatment well    Behavior During Therapy [facility] for tasks assessed/performed           Past Medical History:  Diagnosis Date  . Arthritis    bilateral knees and hands  . Asthma    allergy related, no attacks  . Atypical chest pain 02/2014   april stress test=normal  . 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 Chronic kidney disease    GFR 58, mild  . Edema    maxide prn  . Environmental allergies   . GERD (gastroesophageal reflux disease)   . H/O measles   . H/O mumps   . Hypercholesterolemia   . PONV (postoperative nausea and vomiting)     Past Surgical History:  Procedure Laterality Date  . BREAST BIOPSY Right 09/2002   focal atypical lobular hyperplasia  . BREAST SURGERY Right 10/03   focal atypia hyperplasia  . DILATION AND CURETTAGE OF UTERUS     x2  . HYSTEROSCOPY WITH D & C  07/2007   polyp removed-benign  . LIPOMA EXCISION Right 01/15/2019   Procedure: EXCISION RIGHT BACK LIPOMA;  Surgeon: Wallace Going, DO;  Location: [facility];  Service: Plastics;  Laterality: Right;  . PATELLA-FEMORAL ARTHROPLASTY Left 11/23/2013   Procedure: LEFT KNEE PATELLA-FEMORAL ARTHROPLASTY;  Surgeon: Gearlean Alf, MD;  Location: [facility];  Service: Orthopedics;  Laterality: Left;  . RIGHT OOPHORECTOMY Right 11/91    tubal ligation with complications  . TOTAL KNEE ARTHROPLASTY Left 07/05/2014   Procedure: LEFT TOTAL KNEE ARTHROPLASTY;  Surgeon: Gearlean Alf, MD;  Location: [facility];  Service: Orthopedics;  Laterality: Left;  . TOTAL KNEE ARTHROPLASTY Right 11/11/2017   Procedure: RIGHT TOTAL KNEE ARTHROPLASTY;  Surgeon: Gaynelle Arabian, MD;  Location: [facility];  Service: Orthopedics;  Laterality: Right;  with abductor block  . TUBAL LIGATION  1991  . WISDOM TOOTH EXTRACTION  early 20's    There were no vitals filed for this visit.   Subjective Assessment - 04/12/21 1319    Subjective Patient reports the foot is alright and has been working on her exercises.    Pertinent History Lt TKA 2015, Rt TKA 2018    Limitations Standing;Walking    How long can you stand comfortably? not long probably 30 minutes    How long can you walk comfortably? 30 minutes    Patient Stated Goals I want to be able to increase the pace of my walk and to not have negative impact on my other joints    Currently in Pain? No/denies                             [facility] Adult PT Treatment/Exercise -  04/12/21 0001      Self-Care   Other Self-Care Comments  see patient education      Knee/Hip Exercises: Public affairs consultant 60 seconds    Quad Stretch Limitations bilateral with strap      Knee/Hip Exercises: Supine   Bridges 10 reps    Bridges Limitations x2      Knee/Hip Exercises: Sidelying   Hip ABduction 10 reps    Hip ABduction Limitations x2; bilateral      Manual Therapy   Manual Therapy Soft tissue mobilization;Passive ROM    Manual therapy comments passive gastroc stretch    Soft tissue mobilization scar tissue mobilization, STM plantar fascia    Passive ROM digit 1-5 flexion/extension      Ankle Exercises: Stretches   Soleus Stretch 30 seconds    Soleus Stretch Limitations with strap    Other Stretch great toe flexor stretch 30 sec      Ankle Exercises: Seated   Towel Crunch Limitations  x 10    Other Seated Ankle Exercises rockerboard A/P 2 x 10                  PT Education - 04/12/21 1353    Education Details Updated HEP. Reviewed FOTO    Person(s) Educated Patient    Methods Explanation;Demonstration;Verbal cues;Handout    Comprehension Verbalized understanding;Returned demonstration;Verbal cues required            PT Short Term Goals - 04/12/21 1412      PT SHORT TERM GOAL #1   Title Patient will be independent with initial HEP.    Baseline cues required    Time 2    Period Weeks    Status On-going    Target Date 04/19/21      PT SHORT TERM GOAL #2   Title Therapist will review FOTO and anticipated functional progress.    Baseline reviewed on 4/27    Time 2    Period Weeks    Status Achieved    Target Date 04/19/21      PT SHORT TERM GOAL #3   Title Patient will demonstrate at least 10 degrees of Rt ankle dorsiflexion to improve overall gait mechanics.    Baseline see flowsheet    Time 4    Period Weeks    Status Unable to assess    Target Date 05/03/21      PT SHORT TERM GOAL #4   Title Patient will demonstrate at least 20 degrees of passive great toe extension to reduce stress about the foot when donning/doffing her shoes.    Baseline 10 degrees; increaesed pain about the great toe when taking her shoes off    Time 4    Period Weeks    Status Unable to assess    Target Date 05/03/21             PT Long Term Goals - 04/05/21 1255      PT LONG TERM GOAL #1   Title Patient will walk at least 1400 ft during 6MWT to signify improvements in her overall pace of walking.    Baseline 1088 ft    Time 8    Period Weeks    Status New      PT LONG TERM GOAL #2   Title Patient will demonstrate 5/5 hip strength to improve stability about the chain for prolonged walking activity.    Baseline see flowsheet    Time 8    Period  Weeks    Status New    Target Date 05/31/21      PT LONG TERM GOAL #3   Title Patient will score at least  70% on FOTO to signify clinically meaningful improvement in functional abilities.    Baseline see flowsheet    Time 8    Period Weeks    Status New    Target Date 05/31/21      PT LONG TERM GOAL #4   Title Therapist will assess balance at next visit and set appropriate LTG.    Baseline n/a                 Plan - 04/12/21 1336    Clinical Impression Statement Patient tolerated session well today without reports of pain. Reviewed HEP with patient requiring cues for proper completion of great toe stretching as she was stretching her extensors as opposed to flexors. Recommended that patient could perform soleus stretch with strap as she reports shoulder discomfort when completing calf stretching at the wall. Able to introduce hip strengthening with patient initially reporting feeling hip bridge in her quads, though after stretching quadriceps she felt the exercise in the appropriate area.    Personal Factors and Comorbidities Age;Comorbidity 2;Time since onset of injury/illness/exacerbation    Examination-Activity Limitations Stairs;Stand;Locomotion Level    Examination-Participation Restrictions Other   exercise routine   Stability/Clinical Decision Making Stable/Uncomplicated    Rehab Potential Good    PT Frequency 2x / week   twice a week for 4 weeks then potentially dropping to once a week for 4 additional weeks.   PT Duration 8 weeks    PT Treatment/Interventions ADLs/Self Care Home Management;Aquatic Therapy;Cryotherapy;Moist Heat;Gait training;Stair training;Therapeutic activities;Therapeutic exercise;Balance training;Neuromuscular re-education;Patient/family education;Manual techniques;Passive range of motion;Dry needling;Taping;Vasopneumatic Device    PT Next Visit Plan assess balance (SLS?), scar tissue mobilization, foot intrinsic strengthening, hip strengthening    PT Home Exercise Plan Access Code: [email or web address]    Consulted and Agree with Plan of Care Patient            Patient will benefit from skilled therapeutic intervention in order to improve the following deficits and impairments:  Abnormal gait,Decreased range of motion,Difficulty walking,Decreased activity tolerance,Pain,Impaired flexibility,Decreased strength  Visit Diagnosis: S/P foot surgery  Pain in right foot  Difficulty in walking, not elsewhere classified  Stiffness of right foot, not elsewhere classified     Problem List Patient Active Problem List   Diagnosis Date Noted  . Lipoma of back 12/23/2018  . Chronic kidney disease 09/26/2018  . OA (osteoarthritis) of knee 11/23/2013  . Chronic cough 01/23/2013  . Hypercholesterolemia 11/05/2011   Gwendolyn Grant, PT, DPT, ATC 04/12/21 2:14 PM  [facility] [address] [place], [place], [address] Phone: [phone]   Fax:  [phone]  Name: Diane Fox MRN: [id number] Date of Birth: 09-03-1951

## 2021-04-18 ENCOUNTER — Encounter: Payer: Self-pay | Admitting: Physical Therapy

## 2021-04-18 ENCOUNTER — Other Ambulatory Visit: Payer: Self-pay

## 2021-04-18 ENCOUNTER — Ambulatory Visit: Payer: Medicare Other | Attending: Student | Admitting: Physical Therapy

## 2021-04-18 DIAGNOSIS — Z9889 Other specified postprocedural states: Secondary | ICD-10-CM

## 2021-04-18 DIAGNOSIS — R262 Difficulty in walking, not elsewhere classified: Secondary | ICD-10-CM

## 2021-04-18 DIAGNOSIS — M79671 Pain in right foot: Secondary | ICD-10-CM

## 2021-04-18 DIAGNOSIS — M25674 Stiffness of right foot, not elsewhere classified: Secondary | ICD-10-CM | POA: Diagnosis not present

## 2021-04-18 NOTE — Therapy (Signed)
[place], [place], [address] Phone: [phone]   Fax:  [phone]  Physical Therapy Treatment  Patient Details  Name: Diane Fox MRN: [id number] Date of Birth: 02-Jan-1951 Referring Provider (PT): Corky Sing, [place]   Encounter Date: 04/18/2021   PT End of Session - 04/18/21 1521    Visit Number 3    Number of Visits 13    Date for PT Re-Evaluation 06/03/21    Authorization Type MCR    Progress Note Due on Visit 10    PT Start Time 1525    PT Stop Time 1610    PT Time Calculation (min) 45 min    Activity Tolerance Patient tolerated treatment well    Behavior During Therapy [facility] for tasks assessed/performed           Past Medical History:  Diagnosis Date  . Arthritis    bilateral knees and hands  . Asthma    allergy related, no attacks  . Atypical chest pain 02/2014   april stress test=normal  . 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 Chronic kidney disease    GFR 58, mild  . Edema    maxide prn  . Environmental allergies   . GERD (gastroesophageal reflux disease)   . H/O measles   . H/O mumps   . Hypercholesterolemia   . PONV (postoperative nausea and vomiting)     Past Surgical History:  Procedure Laterality Date  . BREAST BIOPSY Right 09/2002   focal atypical lobular hyperplasia  . BREAST SURGERY Right 10/03   focal atypia hyperplasia  . DILATION AND CURETTAGE OF UTERUS     x2  . HYSTEROSCOPY WITH D & C  07/2007   polyp removed-benign  . LIPOMA EXCISION Right 01/15/2019   Procedure: EXCISION RIGHT BACK LIPOMA;  Surgeon: Wallace Going, DO;  Location: [facility];  Service: Plastics;  Laterality: Right;  . PATELLA-FEMORAL ARTHROPLASTY Left 11/23/2013   Procedure: LEFT KNEE PATELLA-FEMORAL ARTHROPLASTY;  Surgeon: Gearlean Alf, MD;  [facility];  Service: Orthopedics;  Laterality: Left;  . RIGHT OOPHORECTOMY Right 11/91    tubal ligation with complications  . TOTAL KNEE ARTHROPLASTY Left 07/05/2014   Procedure: LEFT TOTAL KNEE ARTHROPLASTY;  Surgeon: Gearlean Alf, MD;  [facility];  Service: Orthopedics;  Laterality: Left;  . TOTAL KNEE ARTHROPLASTY Right 11/11/2017   Procedure: RIGHT TOTAL KNEE ARTHROPLASTY;  Surgeon: Gaynelle Arabian, MD;  [facility];  Service: Orthopedics;  Laterality: Right;  with abductor block  . TUBAL LIGATION  1991  . WISDOM TOOTH EXTRACTION  early 20's    There were no vitals filed for this visit.   Subjective Assessment - 04/18/21 1520    Subjective Patient reports her toes are bothering her today, she doesn't know if she over-did her exercises this morning.    Patient Stated Goals I want to be able to increase the pace of my walk and to not have negative impact on my other joints    Currently in Pain? Yes    Pain Score 5     Pain Location Foot   toes   Pain Orientation Right    Pain Descriptors / Indicators Sharp;Aching    Pain Type Surgical pain    Pain Onset More than a month ago    Pain Frequency Intermittent              [facility] PT Assessment -  04/18/21 0001      Functional Tests   Functional tests Single leg stance      Single Leg Stance   Comments Left: 30 sec, Right: 18 sec   greater difficulty on right with moderate use of UE for support when holding up to 30 sec     AROM   Right Ankle Dorsiflexion 7                         [facility] Adult PT Treatment/Exercise - 04/18/21 0001      Exercises   Exercises Ankle      Knee/Hip Exercises: Stretches   Gastroc Stretch --      Knee/Hip Exercises: Aerobic   Nustep L6 x 5 min with LE only      Manual Therapy   Manual Therapy Joint mobilization;Passive ROM    Joint Mobilization Right talocrural AP/PA mobs, 1st MTP PA mobs    Passive ROM Ankle dorsiflexion and digit flexion stretching      Ankle Exercises: Stretches   Soleus Stretch 30 seconds    Soleus Stretch Limitations supine with  strap    Gastroc Stretch 30 seconds    Gastroc Stretch Limitations standing at Nature conservation officer Stretch 3 reps;30 seconds      Ankle Exercises: Standing   SLS 3 x 30 sec each      Ankle Exercises: Seated   Other Seated Ankle Exercises 4-way ankle with green band 2 x 15                  PT Education - 04/18/21 1521    Education Details HEP update    Person(s) Educated Patient    Methods Explanation;Demonstration;Verbal cues;Handout    Comprehension Verbalized understanding;Returned demonstration;Verbal cues required;Need further instruction            PT Short Term Goals - 04/18/21 1658      PT SHORT TERM GOAL #1   Title Patient will be independent with initial HEP.    Baseline cues required    Time 2    Period Weeks    Status On-going    Target Date 04/19/21      PT SHORT TERM GOAL #2   Title Therapist will review FOTO and anticipated functional progress.    Baseline reviewed on 4/27    Time 2    Period Weeks    Status Achieved    Target Date 04/19/21      PT SHORT TERM GOAL #3   Title Patient will demonstrate at least 10 degrees of Rt ankle dorsiflexion to improve overall gait mechanics.    Baseline see flowsheet    Time 4    Period Weeks    Status On-going    Target Date 05/03/21      PT SHORT TERM GOAL #4   Title Patient will demonstrate at least 20 degrees of passive great toe extension to reduce stress about the foot when donning/doffing her shoes.    Time 4    Period Weeks    Status On-going    Target Date 05/03/21             PT Long Term Goals - 04/05/21 1255      PT LONG TERM GOAL #1   Title Patient will walk at least 1400 ft during 6MWT to signify improvements in her overall pace of walking.    Baseline 1088 ft    Time 8  Period Weeks    Status New      PT LONG TERM GOAL #2   Title Patient will demonstrate 5/5 hip strength to improve stability about the chain for prolonged walking activity.    Baseline see flowsheet     Time 8    Period Weeks    Status New    Target Date 05/31/21      PT LONG TERM GOAL #3   Title Patient will score at least 70% on FOTO to signify clinically meaningful improvement in functional abilities.    Baseline see flowsheet    Time 8    Period Weeks    Status New    Target Date 05/31/21      PT LONG TERM GOAL #4   Title Therapist will assess balance at next visit and set appropriate LTG.    Baseline n/a                 Plan - 04/18/21 1521    Clinical Impression Statement Patient tolerated therapy well with no adverse effects. Therapy focused on progressing ankle and great toe mobility and progressing ankle strength and balance. Patient did exhibit greater difficulty with SLS on right with decreased muscular coordination. Updated HEP to include banded ankle strengthening and SLS to improve ankle strength, balance, and activity tolerance. Patient did exhibit improved ankle dorsiflexion and used manual to further progress ankle and toe motion. Patient would benefit from continued skilled PT to progress strength in order to improve walking and maximize functional ability.    PT Treatment/Interventions ADLs/Self Care Home Management;Aquatic Therapy;Cryotherapy;Moist Heat;Gait training;Stair training;Therapeutic activities;Therapeutic exercise;Balance training;Neuromuscular re-education;Patient/family education;Manual techniques;Passive range of motion;Dry needling;Taping;Vasopneumatic Device    PT Next Visit Plan Review HEP and progress PRN, manual for ankle and toe mobility, scar tissue mobilization, foot intrinsic and ankle strengthening, hip strengthening, balance training    PT Home Exercise Plan Access Code: [email or web address]    Consulted and Agree with Plan of Care Patient           Patient will benefit from skilled therapeutic intervention in order to improve the following deficits and impairments:  Abnormal gait,Decreased range of motion,Difficulty walking,Decreased activity  tolerance,Pain,Impaired flexibility,Decreased strength  Visit Diagnosis: S/P foot surgery  Pain in right foot  Difficulty in walking, not elsewhere classified  Stiffness of right foot, not elsewhere classified     Problem List Patient Active Problem List   Diagnosis Date Noted  . Lipoma of back 12/23/2018  . Chronic kidney disease 09/26/2018  . OA (osteoarthritis) of knee 11/23/2013  . Chronic cough 01/23/2013  . Hypercholesterolemia 11/05/2011    Hilda Blades, PT, DPT, LAT, ATC 04/18/21  5:01 PM Phone: [phone] Fax: [facility] [address] [place], [place], [address] Phone: [phone]   Fax:  [phone]  Name: Diane Fox MRN: [id number] Date of Birth: Aug 16, 1951

## 2021-04-18 NOTE — Patient Instructions (Signed)
Access Code: [email or web address] URL: [email or web address] Date: 04/18/2021 Prepared by: Hilda Blades  Exercises Standing Gastroc Stretch at Counter - 2 x daily - 7 x weekly - 3 reps - 30 hold Long Sitting Soleus Stretch on Bolster with Strap - 2 x daily - 7 x weekly - 3 reps - 30 hold Seated Self Great Toe Stretch - 2 x daily - 7 x weekly - 30 hold - 3 reps Towel Scrunches - 2 x daily - 7 x weekly - 1 sets Seated Ankle Plantarflexion with Resistance - 1 x daily - 7 x weekly - 2 sets - 15 reps Seated Ankle Eversion with Resistance - 1 x daily - 7 x weekly - 2 sets - 15 reps Seated Ankle Inversion with Resistance and Legs Crossed - 1 x daily - 7 x weekly - 2 sets - 15 reps Seated Ankle Dorsiflexion with Resistance - 1 x daily - 7 x weekly - 2 sets - 15 reps Supine Bridge - 1 x daily - 7 x weekly - 2 sets - 10 reps Sidelying Hip Abduction - 1 x daily - 7 x weekly - 2 sets - 10 reps Standing Single Leg Stance with Counter Support - 1 x daily - 7 x weekly - 3 sets - 30-60 hold

## 2021-04-20 ENCOUNTER — Ambulatory Visit: Payer: Medicare Other | Admitting: Physical Therapy

## 2021-04-20 ENCOUNTER — Other Ambulatory Visit: Payer: Self-pay

## 2021-04-20 ENCOUNTER — Encounter: Payer: Self-pay | Admitting: Physical Therapy

## 2021-04-20 DIAGNOSIS — R262 Difficulty in walking, not elsewhere classified: Secondary | ICD-10-CM

## 2021-04-20 DIAGNOSIS — M79671 Pain in right foot: Secondary | ICD-10-CM | POA: Diagnosis not present

## 2021-04-20 DIAGNOSIS — M25674 Stiffness of right foot, not elsewhere classified: Secondary | ICD-10-CM

## 2021-04-20 DIAGNOSIS — Z9889 Other specified postprocedural states: Secondary | ICD-10-CM | POA: Diagnosis not present

## 2021-04-20 NOTE — Therapy (Signed)
[place], [place], [address] Phone: [phone]   Fax:  [phone]  Physical Therapy Treatment  Patient Details  Name: Diane Fox MRN: [id number] Date of Birth: 1951-03-14 Referring Provider (PT): Corky Sing, [place]   Encounter Date: 04/20/2021   PT End of Session - 04/20/21 1053    Visit Number 4    Number of Visits 13    Date for PT Re-Evaluation 06/03/21    Authorization Type MCR    Progress Note Due on Visit 10    PT Start Time 6237    PT Stop Time 1125    PT Time Calculation (min) 40 min    Activity Tolerance Patient tolerated treatment well    Behavior During Therapy [facility] for tasks assessed/performed           Past Medical History:  Diagnosis Date  . Arthritis    bilateral knees and hands  . Asthma    allergy related, no attacks  . Atypical chest pain 02/2014   april stress test=normal  . 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 Chronic kidney disease    GFR 58, mild  . Edema    maxide prn  . Environmental allergies   . GERD (gastroesophageal reflux disease)   . H/O measles   . H/O mumps   . Hypercholesterolemia   . PONV (postoperative nausea and vomiting)     Past Surgical History:  Procedure Laterality Date  . BREAST BIOPSY Right 09/2002   focal atypical lobular hyperplasia  . BREAST SURGERY Right 10/03   focal atypia hyperplasia  . DILATION AND CURETTAGE OF UTERUS     x2  . HYSTEROSCOPY WITH D & C  07/2007   polyp removed-benign  . LIPOMA EXCISION Right 01/15/2019   Procedure: EXCISION RIGHT BACK LIPOMA;  Surgeon: Wallace Going, DO;  Location: [place];  Service: Plastics;  Laterality: Right;  . PATELLA-FEMORAL ARTHROPLASTY Left 11/23/2013   Procedure: LEFT KNEE PATELLA-FEMORAL ARTHROPLASTY;  Surgeon: Gearlean Alf, MD;  [facility];  Service: Orthopedics;  Laterality: Left;  . RIGHT OOPHORECTOMY Right 11/91    tubal ligation with complications  . TOTAL KNEE ARTHROPLASTY Left 07/05/2014   Procedure: LEFT TOTAL KNEE ARTHROPLASTY;  Surgeon: Gearlean Alf, MD;  [facility];  Service: Orthopedics;  Laterality: Left;  . TOTAL KNEE ARTHROPLASTY Right 11/11/2017   Procedure: RIGHT TOTAL KNEE ARTHROPLASTY;  Surgeon: Gaynelle Arabian, MD;  [facility];  Service: Orthopedics;  Laterality: Right;  with abductor block  . TUBAL LIGATION  1991  . WISDOM TOOTH EXTRACTION  early 20's    There were no vitals filed for this visit.   Subjective Assessment - 04/20/21 1050    Subjective Patient reports no changes since last visit.    Patient Stated Goals I want to be able to increase the pace of my walk and to not have negative impact on my other joints    Currently in Pain? Yes    Pain Score 4     Pain Location Foot   1st MTP   Pain Orientation Right    Pain Descriptors / Indicators Aching    Pain Type Surgical pain    Pain Onset More than a month ago    Pain Frequency Intermittent  [place] Adult PT Treatment/Exercise - 04/20/21 0001      Exercises   Exercises Ankle      Knee/Hip Exercises: Aerobic   Nustep L6 x 5 min with LE only      Knee/Hip Exercises: Sidelying   Hip ABduction 2 sets;10 reps      Ankle Exercises: Stretches   Soleus Stretch 2 reps;30 seconds    Soleus Stretch Limitations supine with strap    Slant Board Stretch 3 reps;30 seconds      Ankle Exercises: Standing   SLS Airex 3 x 30 sec each    Heel Raises 15 reps   2 sets   Heel Raises Limitations Heel-toe raises on Airex      Ankle Exercises: Supine   T-Band 4-way ankle with green 2 x 15                  PT Education - 04/20/21 1052    Education Details HEP    Person(s) Educated Patient    Methods Explanation    Comprehension Verbalized understanding;Need further instruction            PT Short Term Goals - 04/20/21 1113      PT SHORT TERM GOAL #1    Title Patient will be independent with initial HEP.    Baseline patient independent with current HEP    Time 2    Period Weeks    Status Achieved    Target Date 04/19/21      PT SHORT TERM GOAL #2   Title Therapist will review FOTO and anticipated functional progress.    Baseline reviewed on 4/27    Time 2    Period Weeks    Status Achieved    Target Date 04/19/21      PT SHORT TERM GOAL #3   Title Patient will demonstrate at least 10 degrees of Rt ankle dorsiflexion to improve overall gait mechanics.    Baseline see flowsheet    Time 4    Period Weeks    Status On-going    Target Date 05/03/21      PT SHORT TERM GOAL #4   Title Patient will demonstrate at least 20 degrees of passive great toe extension to reduce stress about the foot when donning/doffing her shoes.    Time 4    Period Weeks    Status On-going    Target Date 05/03/21             PT Long Term Goals - 04/20/21 1119      PT LONG TERM GOAL #1   Title Patient will walk at least 1400 ft during 6MWT to signify improvements in her overall pace of walking.    Baseline 1088 ft    Time 8    Period Weeks    Status New    Target Date 05/31/21      PT LONG TERM GOAL #2   Title Patient will demonstrate 5/5 hip strength to improve stability about the chain for prolonged walking activity.    Baseline see flowsheet    Time 8    Period Weeks    Status New    Target Date 05/31/21      PT LONG TERM GOAL #3   Title Patient will score at least 70% on FOTO to signify clinically meaningful improvement in functional abilities.    Baseline see flowsheet    Time 8    Period Weeks    Status New  Target Date 05/31/21      PT LONG TERM GOAL #4   Title Patient will be able to maintain SLS 30 sec on right to promote improved balance and ankle control    Baseline 18 sec    Time 8    Period Weeks    Status New    Target Date 05/31/21                 Plan - 04/20/21 1054    Clinical Impression Statement  Patient tolerated therapy well with no adverse effects. Therapy focused on exercise progressing mobility, strength, and balance on right ankle/foot. Incorporated standing heel raises this visit on Airex with good tolerance and progressed SLS to Airex. She does continues to exhibit greater difficulty with balance on right. No change to current HEP this visit. Patient would benefit from continued skilled PT to progress strength in order to improve walking and maximize functional ability.    PT Treatment/Interventions ADLs/Self Care Home Management;Aquatic Therapy;Cryotherapy;Moist Heat;Gait training;Stair training;Therapeutic activities;Therapeutic exercise;Balance training;Neuromuscular re-education;Patient/family education;Manual techniques;Passive range of motion;Dry needling;Taping;Vasopneumatic Device    PT Next Visit Plan Review HEP and progress PRN, manual for ankle and toe mobility, scar tissue mobilization, foot intrinsic and ankle strengthening, hip strengthening, balance training    PT Home Exercise Plan Access Code: [email or web address]    Consulted and Agree with Plan of Care Patient           Patient will benefit from skilled therapeutic intervention in order to improve the following deficits and impairments:  Abnormal gait,Decreased range of motion,Difficulty walking,Decreased activity tolerance,Pain,Impaired flexibility,Decreased strength  Visit Diagnosis: S/P foot surgery  Pain in right foot  Difficulty in walking, not elsewhere classified  Stiffness of right foot, not elsewhere classified     Problem List Patient Active Problem List   Diagnosis Date Noted  . Lipoma of back 12/23/2018  . Chronic kidney disease 09/26/2018  . OA (osteoarthritis) of knee 11/23/2013  . Chronic cough 01/23/2013  . Hypercholesterolemia 11/05/2011    Hilda Blades, PT, DPT, LAT, ATC 04/20/21  11:37 AM Phone: [phone] Fax: [facility] [address] [place], [place], [address] Phone: [phone]   Fax:  [phone]  Name: Diane Fox MRN: [id number] Date of Birth: December 14, 1951

## 2021-04-24 ENCOUNTER — Other Ambulatory Visit: Payer: Self-pay | Admitting: Cardiology

## 2021-04-25 ENCOUNTER — Ambulatory Visit: Payer: Medicare Other

## 2021-04-25 ENCOUNTER — Other Ambulatory Visit: Payer: Self-pay

## 2021-04-25 DIAGNOSIS — M79671 Pain in right foot: Secondary | ICD-10-CM | POA: Diagnosis not present

## 2021-04-25 DIAGNOSIS — R262 Difficulty in walking, not elsewhere classified: Secondary | ICD-10-CM

## 2021-04-25 DIAGNOSIS — Z9889 Other specified postprocedural states: Secondary | ICD-10-CM

## 2021-04-25 DIAGNOSIS — M25674 Stiffness of right foot, not elsewhere classified: Secondary | ICD-10-CM | POA: Diagnosis not present

## 2021-04-25 NOTE — Therapy (Signed)
[name], [place], [address] Phone: [phone]   Fax:  [phone]  Physical Therapy Treatment  Patient Details  Name: Diane Fox MRN: [id number] Date of Birth: 1951-09-24 Referring Provider (PT): Corky Sing, [place]   Encounter Date: 04/25/2021   PT End of Session - 04/25/21 1315    Visit Number 5    Number of Visits 13    Date for PT Re-Evaluation 06/03/21    Authorization Type MCR    Progress Note Due on Visit 10    PT Start Time 4718    PT Stop Time 1359    PT Time Calculation (min) 42 min    Activity Tolerance Patient tolerated treatment well    Behavior During Therapy [facility] for tasks assessed/performed           Past Medical History:  Diagnosis Date  . Arthritis    bilateral knees and hands  . Asthma    allergy related, no attacks  . Atypical chest pain 02/2014   april stress test=normal  . 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 Chronic kidney disease    GFR 58, mild  . Edema    maxide prn  . Environmental allergies   . GERD (gastroesophageal reflux disease)   . H/O measles   . H/O mumps   . Hypercholesterolemia   . PONV (postoperative nausea and vomiting)     Past Surgical History:  Procedure Laterality Date  . BREAST BIOPSY Right 09/2002   focal atypical lobular hyperplasia  . BREAST SURGERY Right 10/03   focal atypia hyperplasia  . DILATION AND CURETTAGE OF UTERUS     x2  . HYSTEROSCOPY WITH D & C  07/2007   polyp removed-benign  . LIPOMA EXCISION Right 01/15/2019   Procedure: EXCISION RIGHT BACK LIPOMA;  Surgeon: Wallace Going, DO;  Location: [place];  Service: Plastics;  Laterality: Right;  . PATELLA-FEMORAL ARTHROPLASTY Left 11/23/2013   Procedure: LEFT KNEE PATELLA-FEMORAL ARTHROPLASTY;  Surgeon: Gearlean Alf, MD;  [facility];  Service: Orthopedics;  Laterality: Left;  . RIGHT OOPHORECTOMY Right 11/91    tubal ligation with complications  . TOTAL KNEE ARTHROPLASTY Left 07/05/2014   Procedure: LEFT TOTAL KNEE ARTHROPLASTY;  Surgeon: Gearlean Alf, MD;  [facility];  Service: Orthopedics;  Laterality: Left;  . TOTAL KNEE ARTHROPLASTY Right 11/11/2017   Procedure: RIGHT TOTAL KNEE ARTHROPLASTY;  Surgeon: Gaynelle Arabian, MD;  [facility];  Service: Orthopedics;  Laterality: Right;  with abductor block  . TUBAL LIGATION  1991  . WISDOM TOOTH EXTRACTION  early 20's    There were no vitals filed for this visit.   Subjective Assessment - 04/25/21 1319    Subjective "I think we are getting there. I'm picking up a little speed when I walk in the neighborhood."    Patient Stated Goals I want to be able to increase the pace of my walk and to not have negative impact on my other joints    Currently in Pain? No/denies    Pain Onset More than a month ago              [facility] PT Assessment - 04/25/21 0001      AROM   Right Ankle Dorsiflexion 10      PROM   Overall PROM Comments great toe passive extension 15 Rt  [place] Adult PT Treatment/Exercise - 04/25/21 0001      Self-Care   Other Self-Care Comments  see patient education      Knee/Hip Exercises: Aerobic   Nustep L5 x 5 min LE only      Knee/Hip Exercises: Machines for Strengthening   Total Gym Leg Press 10 reps 20 lbs with green band at thighs      Knee/Hip Exercises: Standing   Hip Flexion 10 reps    Hip Flexion Limitations x 3 marching    Hip Extension 10 reps    Extension Limitations x 2 bilateral      Knee/Hip Exercises: Seated   Sit to Sand 10 reps      Ankle Exercises: Stretches   Slant Board Stretch 30 seconds;3 reps    Other Stretch toe flexor stretch at wall 30 sec      Ankle Exercises: Standing   Heel Raises 10 reps   x2   Heel Raises Limitations x2    Other Standing Ankle Exercises heel/toe rocks on airex 2 x 20                  PT Education - 04/25/21  1341    Education Details Updated HEP.    Person(s) Educated Patient    Methods Explanation;Demonstration;Handout;Verbal cues    Comprehension Verbalized understanding;Returned demonstration            PT Short Term Goals - 04/25/21 1332      PT SHORT TERM GOAL #1   Title Patient will be independent with initial HEP.    Baseline patient independent with current HEP    Time 2    Period Weeks    Status Achieved    Target Date 04/19/21      PT SHORT TERM GOAL #2   Title Therapist will review FOTO and anticipated functional progress.    Baseline reviewed on 4/27    Time 2    Period Weeks    Status Achieved    Target Date 04/19/21      PT SHORT TERM GOAL #3   Title Patient will demonstrate at least 10 degrees of Rt ankle dorsiflexion to improve overall gait mechanics.    Baseline see flowsheet    Time 4    Period Weeks    Status Achieved    Target Date 05/03/21      PT SHORT TERM GOAL #4   Title Patient will demonstrate at least 20 degrees of passive great toe extension to reduce stress about the foot when donning/doffing her shoes.    Baseline see flowsheet    Time 4    Period Weeks    Status On-going    Target Date 05/03/21             PT Long Term Goals - 04/20/21 1119      PT LONG TERM GOAL #1   Title Patient will walk at least 1400 ft during 6MWT to signify improvements in her overall pace of walking.    Baseline 1088 ft    Time 8    Period Weeks    Status New    Target Date 05/31/21      PT LONG TERM GOAL #2   Title Patient will demonstrate 5/5 hip strength to improve stability about the chain for prolonged walking activity.    Baseline see flowsheet    Time 8    Period Weeks    Status New    Target Date 05/31/21  PT LONG TERM GOAL #3   Title Patient will score at least 70% on FOTO to signify clinically meaningful improvement in functional abilities.    Baseline see flowsheet    Time 8    Period Weeks    Status New    Target Date 05/31/21       PT LONG TERM GOAL #4   Title Patient will be able to maintain SLS 30 sec on right to promote improved balance and ankle control    Baseline 18 sec    Time 8    Period Weeks    Status New    Target Date 05/31/21                 Plan - 04/25/21 1333    Clinical Impression Statement Patient tolerated session well today without reports of pain. Her dorsiflexion AROM continues to improve having met established short term goal. Her passive great toe extension remains limited, though has improved compared to baseline. Session today focused on ankle and hip strengthening in standing with patient reporting muscle fatigue in hips. Patient has difficulty controlling bilateral knee valgus collapse during ascent of sit<>stand, though improved alignment when performing squats on leg press.    PT Treatment/Interventions ADLs/Self Care Home Management;Aquatic Therapy;Cryotherapy;Moist Heat;Gait training;Stair training;Therapeutic activities;Therapeutic exercise;Balance training;Neuromuscular re-education;Patient/family education;Manual techniques;Passive range of motion;Dry needling;Taping;Vasopneumatic Device    PT Next Visit Plan FOTO. Review HEP and progress PRN, manual for ankle and toe mobility, scar tissue mobilization, foot intrinsic and ankle strengthening, hip strengthening, balance training    PT Home Exercise Plan Access Code: [email or web address]    Consulted and Agree with Plan of Care Patient           Patient will benefit from skilled therapeutic intervention in order to improve the following deficits and impairments:  Abnormal gait,Decreased range of motion,Difficulty walking,Decreased activity tolerance,Pain,Impaired flexibility,Decreased strength  Visit Diagnosis: S/P foot surgery  Pain in right foot  Difficulty in walking, not elsewhere classified  Stiffness of right foot, not elsewhere classified     Problem List Patient Active Problem List   Diagnosis Date Noted  . Lipoma  of back 12/23/2018  . Chronic kidney disease 09/26/2018  . OA (osteoarthritis) of knee 11/23/2013  . Chronic cough 01/23/2013  . Hypercholesterolemia 11/05/2011   Gwendolyn Grant, PT, DPT, ATC 04/25/21 2:04 PM  [facility] [address] [place], [place], [address] Phone: [phone]   Fax:  [phone]  Name: PEARSON REASONS MRN: [id number] Date of Birth: Jun 21, 1951

## 2021-04-27 ENCOUNTER — Ambulatory Visit: Payer: Medicare Other | Admitting: Physical Therapy

## 2021-04-27 ENCOUNTER — Other Ambulatory Visit: Payer: Self-pay

## 2021-04-27 ENCOUNTER — Ambulatory Visit: Payer: Medicare Other

## 2021-04-27 DIAGNOSIS — M79671 Pain in right foot: Secondary | ICD-10-CM | POA: Diagnosis not present

## 2021-04-27 DIAGNOSIS — Z9889 Other specified postprocedural states: Secondary | ICD-10-CM | POA: Diagnosis not present

## 2021-04-27 DIAGNOSIS — R262 Difficulty in walking, not elsewhere classified: Secondary | ICD-10-CM | POA: Diagnosis not present

## 2021-04-27 DIAGNOSIS — M25674 Stiffness of right foot, not elsewhere classified: Secondary | ICD-10-CM | POA: Diagnosis not present

## 2021-04-27 NOTE — Therapy (Signed)
[place], [place], [address] Phone: [phone]   Fax:  [phone]  Physical Therapy Treatment  Patient Details  Name: Diane Fox MRN: [id number] Date of Birth: 02-Nov-1951 Referring Provider (PT): Corky Sing, [place]   Encounter Date: 04/27/2021   PT End of Session - 04/27/21 1100    Visit Number 6    Number of Visits 13    Date for PT Re-Evaluation 06/03/21    Authorization Type MCR    Progress Note Due on Visit 10    PT Start Time 1100    PT Stop Time 1145    PT Time Calculation (min) 45 min    Activity Tolerance Patient tolerated treatment well    Behavior During Therapy [facility] for tasks assessed/performed           Past Medical History:  Diagnosis Date  . Arthritis    bilateral knees and hands  . Asthma    allergy related, no attacks  . Atypical chest pain 02/2014   april stress test=normal  . 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 Chronic kidney disease    GFR 58, mild  . Edema    maxide prn  . Environmental allergies   . GERD (gastroesophageal reflux disease)   . H/O measles   . H/O mumps   . Hypercholesterolemia   . PONV (postoperative nausea and vomiting)     Past Surgical History:  Procedure Laterality Date  . BREAST BIOPSY Right 09/2002   focal atypical lobular hyperplasia  . BREAST SURGERY Right 10/03   focal atypia hyperplasia  . DILATION AND CURETTAGE OF UTERUS     x2  . HYSTEROSCOPY WITH D & C  07/2007   polyp removed-benign  . LIPOMA EXCISION Right 01/15/2019   Procedure: EXCISION RIGHT BACK LIPOMA;  Surgeon: Wallace Going, DO;  Location: [place];  Service: Plastics;  Laterality: Right;  . PATELLA-FEMORAL ARTHROPLASTY Left 11/23/2013   Procedure: LEFT KNEE PATELLA-FEMORAL ARTHROPLASTY;  Surgeon: Gearlean Alf, MD;  [facility];  Service: Orthopedics;  Laterality: Left;  . RIGHT OOPHORECTOMY Right 11/91    tubal ligation with complications  . TOTAL KNEE ARTHROPLASTY Left 07/05/2014   Procedure: LEFT TOTAL KNEE ARTHROPLASTY;  Surgeon: Gearlean Alf, MD;  [facility];  Service: Orthopedics;  Laterality: Left;  . TOTAL KNEE ARTHROPLASTY Right 11/11/2017   Procedure: RIGHT TOTAL KNEE ARTHROPLASTY;  Surgeon: Gaynelle Arabian, MD;  [facility];  Service: Orthopedics;  Laterality: Right;  with abductor block  . TUBAL LIGATION  1991  . WISDOM TOOTH EXTRACTION  early 20's    There were no vitals filed for this visit.   Subjective Assessment - 04/27/21 1104    Subjective Patient reports she is doing well today without reports of pain.    Patient Stated Goals I want to be able to increase the pace of my walk and to not have negative impact on my other joints    Currently in Pain? No/denies    Pain Onset --              [facility] PT Assessment - 04/27/21 0001      Observation/Other Assessments   Focus on Therapeutic Outcomes (FOTO)  73% function                         [facility] Adult PT Treatment/Exercise - 04/27/21 0001  Self-Care   Other Self-Care Comments  see patient education      Neuro Re-ed    Neuro Re-ed Details  SLS on airex 3 x 15 sec, tandem on airex 3 x 30 sec, lateral step up on airex 2 x 20      Knee/Hip Exercises: Aerobic   Nustep L5 x 5 min LE/UE      Knee/Hip Exercises: Machines for Strengthening   Total Gym Leg Press 2 x 10; 40 lbs      Knee/Hip Exercises: Supine   Bridges with Clamshell 10 reps   x2 with blue band     Knee/Hip Exercises: Sidelying   Clams 2 x 10 blue band      Ankle Exercises: Standing   Rocker Board Other (comment)   2 x 10 A/P                 PT Education - 04/27/21 1118    Education Details Updated HEP. FOTO score.    Person(s) Educated Patient    Methods Explanation;Verbal cues;Handout;Tactile cues    Comprehension Verbalized understanding;Returned demonstration;Verbal cues required            PT  Short Term Goals - 04/25/21 1332      PT SHORT TERM GOAL #1   Title Patient will be independent with initial HEP.    Baseline patient independent with current HEP    Time 2    Period Weeks    Status Achieved    Target Date 04/19/21      PT SHORT TERM GOAL #2   Title Therapist will review FOTO and anticipated functional progress.    Baseline reviewed on 4/27    Time 2    Period Weeks    Status Achieved    Target Date 04/19/21      PT SHORT TERM GOAL #3   Title Patient will demonstrate at least 10 degrees of Rt ankle dorsiflexion to improve overall gait mechanics.    Baseline see flowsheet    Time 4    Period Weeks    Status Achieved    Target Date 05/03/21      PT SHORT TERM GOAL #4   Title Patient will demonstrate at least 20 degrees of passive great toe extension to reduce stress about the foot when donning/doffing her shoes.    Baseline see flowsheet    Time 4    Period Weeks    Status On-going    Target Date 05/03/21             PT Long Term Goals - 04/27/21 1116      PT LONG TERM GOAL #1   Title Patient will walk at least 1400 ft during 6MWT to signify improvements in her overall pace of walking.    Baseline 1088 ft    Time 8    Period Weeks    Status Unable to assess      PT LONG TERM GOAL #2   Title Patient will demonstrate 5/5 hip strength to improve stability about the chain for prolonged walking activity.    Baseline see flowsheet    Time 8    Period Weeks    Status Unable to assess      PT LONG TERM GOAL #3   Title Patient will score at least 70% on FOTO to signify clinically meaningful improvement in functional abilities.    Baseline 73%    Time 8    Period Weeks    Status  Achieved      PT LONG TERM GOAL #4   Title Patient will be able to maintain SLS 30 sec on right to promote improved balance and ankle control    Baseline 18 sec    Time 8    Period Weeks    Status On-going                 Plan - 04/27/21 1105    Clinical  Impression Statement Patient tolerated session well today without reports of pain. Session focused on BLE strengthening and balance training. She demonstrates good postural control during tandem balance on unstable surface, though has difficulty maintaining control during SLS on unstable surface requiring frequent use of reaching strategy to maintain balance. She reported muscle fatigue at end of session. Her FOTO outcome score has much improved compared to baseline having met this long term functional goal.    PT Treatment/Interventions ADLs/Self Care Home Management;Aquatic Therapy;Cryotherapy;Moist Heat;Gait training;Stair training;Therapeutic activities;Therapeutic exercise;Balance training;Neuromuscular re-education;Patient/family education;Manual techniques;Passive range of motion;Dry needling;Taping;Vasopneumatic Device    PT Next Visit Plan Review HEP and progress PRN, manual for ankle and toe mobility, scar tissue mobilization, foot intrinsic and ankle strengthening, hip strengthening, balance training    PT Home Exercise Plan Access Code: [email or web address]    Consulted and Agree with Plan of Care Patient           Patient will benefit from skilled therapeutic intervention in order to improve the following deficits and impairments:  Abnormal gait,Decreased range of motion,Difficulty walking,Decreased activity tolerance,Pain,Impaired flexibility,Decreased strength  Visit Diagnosis: S/P foot surgery  Pain in right foot  Difficulty in walking, not elsewhere classified  Stiffness of right foot, not elsewhere classified     Problem List Patient Active Problem List   Diagnosis Date Noted  . Lipoma of back 12/23/2018  . Chronic kidney disease 09/26/2018  . OA (osteoarthritis) of knee 11/23/2013  . Chronic cough 01/23/2013  . Hypercholesterolemia 11/05/2011   Gwendolyn Grant, PT, DPT, ATC 04/27/21 12:01 PM  [facility] [address] [place], [place], [address] Phone: [phone]   Fax:  [phone]  Name: GERMAINE RIPP MRN: [id number] Date of Birth: 12-20-1950

## 2021-05-01 ENCOUNTER — Other Ambulatory Visit: Payer: Self-pay

## 2021-05-01 ENCOUNTER — Encounter (HOSPITAL_BASED_OUTPATIENT_CLINIC_OR_DEPARTMENT_OTHER): Payer: Self-pay | Admitting: Obstetrics & Gynecology

## 2021-05-01 ENCOUNTER — Ambulatory Visit (INDEPENDENT_AMBULATORY_CARE_PROVIDER_SITE_OTHER): Payer: Medicare Other | Admitting: Obstetrics & Gynecology

## 2021-05-01 VITALS — BP 123/70 | HR 86 | Ht 62.5 in | Wt 180.0 lb

## 2021-05-01 DIAGNOSIS — Z9079 Acquired absence of other genital organ(s): Secondary | ICD-10-CM | POA: Diagnosis not present

## 2021-05-01 DIAGNOSIS — Z90721 Acquired absence of ovaries, unilateral: Secondary | ICD-10-CM

## 2021-05-01 DIAGNOSIS — D692 Other nonthrombocytopenic purpura: Secondary | ICD-10-CM | POA: Diagnosis not present

## 2021-05-01 DIAGNOSIS — Z780 Asymptomatic menopausal state: Secondary | ICD-10-CM | POA: Diagnosis not present

## 2021-05-01 DIAGNOSIS — D2262 Melanocytic nevi of left upper limb, including shoulder: Secondary | ICD-10-CM | POA: Diagnosis not present

## 2021-05-01 DIAGNOSIS — N6091 Unspecified benign mammary dysplasia of right breast: Secondary | ICD-10-CM

## 2021-05-01 DIAGNOSIS — D2272 Melanocytic nevi of left lower limb, including hip: Secondary | ICD-10-CM | POA: Diagnosis not present

## 2021-05-01 DIAGNOSIS — L821 Other seborrheic keratosis: Secondary | ICD-10-CM | POA: Diagnosis not present

## 2021-05-01 DIAGNOSIS — Z01411 Encounter for gynecological examination (general) (routine) with abnormal findings: Secondary | ICD-10-CM

## 2021-05-01 DIAGNOSIS — L814 Other melanin hyperpigmentation: Secondary | ICD-10-CM | POA: Diagnosis not present

## 2021-05-01 DIAGNOSIS — D225 Melanocytic nevi of trunk: Secondary | ICD-10-CM | POA: Diagnosis not present

## 2021-05-01 DIAGNOSIS — D1801 Hemangioma of skin and subcutaneous tissue: Secondary | ICD-10-CM | POA: Diagnosis not present

## 2021-05-01 DIAGNOSIS — D485 Neoplasm of uncertain behavior of skin: Secondary | ICD-10-CM | POA: Diagnosis not present

## 2021-05-01 DIAGNOSIS — D235 Other benign neoplasm of skin of trunk: Secondary | ICD-10-CM | POA: Diagnosis not present

## 2021-05-01 DIAGNOSIS — D2271 Melanocytic nevi of right lower limb, including hip: Secondary | ICD-10-CM | POA: Diagnosis not present

## 2021-05-01 DIAGNOSIS — D2261 Melanocytic nevi of right upper limb, including shoulder: Secondary | ICD-10-CM | POA: Diagnosis not present

## 2021-05-01 NOTE — Progress Notes (Signed)
70 y.o. [id number] Married White or Caucasian female here for breast and pelvic exam.  H/o atypical lobular hyperplasia.  She was followed with increased surveillance with MRI with Dr. Annamaria Boots with a study protocol.  Abbreviated breast MRI discussed today.  Tyrer Cusick model for breast cancer risk performed today with 25% lifetime risk.    Denies vaginal bleeding.  Had episode earlier this year of possible dysuria.  Drank some cranberry juice.  A few days later, had actual blood in urine.  Urine testing showed possible UTI.  Was placed on antibiotics.  Culture was ultimately negative.  Repeat culture with PCP was positive.  Treated with antibiotics.  Repeat culture was negative.  CT abd/pelvis was negative except for 2.5cm liver hemangioma noted.  This was 04/07/2021.  She's had no symptoms since.      Patient's last menstrual period was 10/27/2012.          Sexually active: No.  H/O STD:  no  Health Maintenance: PCP:  Dr. Mannie Stabile.  Last wellness appt was 03/2021.  Did blood work at that appt:  yet Vaccines are up to date:  yes Colonoscopy:  2012, due MMG:  06/2020 BMD:  06/2020 Last pap smear:  06/2020.   H/o abnormal pap smear:  no   reports that she has never smoked. She has never used smokeless tobacco. She reports current alcohol use of about 1.0 standard drink of alcohol per week. She reports that she does not use drugs.  Past Medical History:  Diagnosis Date  . Arthritis    bilateral knees and hands  . Asthma    allergy related, no attacks  . Atypical chest pain 02/2014   april stress test=normal  . 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 Chronic kidney disease    GFR 58, mild  . Edema    maxide prn  . Environmental allergies   . GERD (gastroesophageal reflux disease)   . H/O measles   . H/O mumps   . Hypercholesterolemia   . PONV (postoperative nausea and vomiting)     Past Surgical History:  Procedure Laterality Date  . BREAST BIOPSY Right 09/2002    focal atypical lobular hyperplasia  . BREAST SURGERY Right 10/03   focal atypia hyperplasia  . DILATION AND CURETTAGE OF UTERUS     x2  . FOOT SURGERY Right    Bunion  . HYSTEROSCOPY WITH D & C  07/2007   polyp removed-benign  . LIPOMA EXCISION Right 01/15/2019   Procedure: EXCISION RIGHT BACK LIPOMA;  Surgeon: Wallace Going, DO;  Location: [place];  Service: Plastics;  Laterality: Right;  . PATELLA-FEMORAL ARTHROPLASTY Left 11/23/2013   Procedure: LEFT KNEE PATELLA-FEMORAL ARTHROPLASTY;  Surgeon: Gearlean Alf, MD;  [facility];  Service: Orthopedics;  Laterality: Left;  . RIGHT OOPHORECTOMY Right 11/91   tubal ligation with complications  . TOTAL KNEE ARTHROPLASTY Left 07/05/2014   Procedure: LEFT TOTAL KNEE ARTHROPLASTY;  Surgeon: Gearlean Alf, MD;  [facility];  Service: Orthopedics;  Laterality: Left;  . TOTAL KNEE ARTHROPLASTY Right 11/11/2017   Procedure: RIGHT TOTAL KNEE ARTHROPLASTY;  Surgeon: Gaynelle Arabian, MD;  [facility];  Service: Orthopedics;  Laterality: Right;  with abductor block  . TUBAL LIGATION  1991  . WISDOM TOOTH EXTRACTION  early 20's    Current Outpatient Medications  Medication Sig Dispense Refill  . Calcium Carbonate (CALCIUM 600 PO) Take by mouth.    . chlorpheniramine (CHLOR-TRIMETON)  4 MG tablet Take 4 mg by mouth daily as needed for allergies.     . cholecalciferol (VITAMIN D) 1000 units tablet Take 1,000 Units by mouth daily.    . Coenzyme Q10 (CO Q 10 PO) Take 200 mg by mouth daily.    . fexofenadine (ALLEGRA) 180 MG tablet Take 180 mg by mouth daily.    Marland Kitchen losartan (COZAAR) 25 MG tablet     . Melatonin 1 MG CAPS daily as needed.    . Multiple Vitamins-Minerals (CENTRUM SILVER PO) Take by mouth.    Marland Kitchen omeprazole (PRILOSEC) 20 MG capsule Take 20 mg by mouth daily.    . rosuvastatin (CRESTOR) 10 MG tablet TAKE 1 TABLET BY MOUTH  DAILY 90 tablet 3  . triamcinolone (NASACORT) 55 MCG/ACT AERO nasal inhaler Place  2 sprays into the nose daily.    Marland Kitchen triamterene-hydrochlorothiazide (MAXZIDE) 75-50 MG per tablet Take 1 tablet by mouth every morning.    . vitamin C (ASCORBIC ACID) 500 MG tablet Take 500 mg by mouth daily.    . vitamin E 400 UNIT capsule Take 400 Units by mouth daily.     No current facility-administered medications for this visit.    Family History  Problem Relation Age of Onset  . Heart disease Father   . Heart attack Father   . Hypertension Father   . Heart disease Mother   . Hypertension Mother   . Asthma Brother   . Cancer Maternal Grandmother        lymphoma  . Cancer Maternal Grandfather        prostate cancer    Review of Systems  All other systems reviewed and are negative.   Exam:   BP 123/70   Pulse 86   Ht 5' 2.5" (1.588 m)   Wt 180 lb (81.6 kg)   LMP 10/27/2012   BMI 32.40 kg/m   Height: 5' 2.5" (158.8 cm)  General appearance: alert, cooperative and appears stated age Breasts: normal appearance, no masses or tenderness Abdomen: soft, non-tender; bowel sounds normal; no masses,  no organomegaly Lymph nodes: Cervical, supraclavicular, and axillary nodes normal.  No abnormal inguinal nodes palpated Neurologic: Grossly normal  Pelvic: External genitalia:  no lesions              Urethra:  normal appearing urethra with no masses, tenderness or lesions              Bartholins and Skenes: normal                 Vagina: normal appearing vagina with atrophic changes and no discharge, no lesions              Cervix: no lesions              Pap taken: No. Bimanual Exam:  Uterus:  normal size, contour, position, consistency, mobility, non-tender              Adnexa: normal adnexa and no mass, fullness, tenderness               Rectovaginal: Confirms               Anus:  normal sphincter tone, no lesions  Chaperone, Octaviano Batty, CMA, was present for exam.  Assessment/Plan: 1. Encntr for gyn exam (general) (routine) w abnormal findings - pap neg 2021 - MMG  06/2020 - BMD 06/2020 - Colonoscopy due this year.  Pt aware and planning on scheduling with Dr. Cristina Gong -  lab work done with Dr. Mannie Stabile - vaccines updated  2. Postmenopausal - no HRT  3. Atypical lobular hyperplasia (ALH) of right breast - Typer Cusick model with >20% lifetime risk.  Declines breast MRI at this time.  4. History of salpingoophorectomy  Total time with pt:  25 mintues

## 2021-05-02 ENCOUNTER — Ambulatory Visit: Payer: Medicare Other

## 2021-05-02 DIAGNOSIS — R262 Difficulty in walking, not elsewhere classified: Secondary | ICD-10-CM

## 2021-05-02 DIAGNOSIS — M25674 Stiffness of right foot, not elsewhere classified: Secondary | ICD-10-CM

## 2021-05-02 DIAGNOSIS — M79671 Pain in right foot: Secondary | ICD-10-CM

## 2021-05-02 DIAGNOSIS — Z9889 Other specified postprocedural states: Secondary | ICD-10-CM

## 2021-05-02 NOTE — Therapy (Signed)
[place], [place], [address] Phone: [phone]   Fax:  [phone]  Physical Therapy Treatment  Patient Details  Name: Diane Fox MRN: [id number] Date of Birth: 05/24/1951 Referring Provider (PT): Corky Sing, [place]   Encounter Date: 05/02/2021   PT End of Session - 05/02/21 1234    Visit Number 7    Number of Visits 13    Date for PT Re-Evaluation 06/03/21    Authorization Type MCR    Progress Note Due on Visit 10    PT Start Time 1232    PT Stop Time 1312    PT Time Calculation (min) 40 min    Activity Tolerance Patient tolerated treatment well    Behavior During Therapy [facility] for tasks assessed/performed           Past Medical History:  Diagnosis Date  . Arthritis    bilateral knees and hands  . Asthma    allergy related, no attacks  . Atypical chest pain 02/2014   april stress test=normal  . 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 Chronic kidney disease    GFR 58, mild  . Edema    maxide prn  . Environmental allergies   . GERD (gastroesophageal reflux disease)   . H/O measles   . H/O mumps   . Hypercholesterolemia   . PONV (postoperative nausea and vomiting)     Past Surgical History:  Procedure Laterality Date  . BREAST BIOPSY Right 09/2002   focal atypical lobular hyperplasia  . BREAST SURGERY Right 10/03   focal atypia hyperplasia  . DILATION AND CURETTAGE OF UTERUS     x2  . FOOT SURGERY Right    Bunion  . HYSTEROSCOPY WITH D & C  07/2007   polyp removed-benign  . LIPOMA EXCISION Right 01/15/2019   Procedure: EXCISION RIGHT BACK LIPOMA;  Surgeon: Wallace Going, DO;  Location: [place];  Service: Plastics;  Laterality: Right;  . PATELLA-FEMORAL ARTHROPLASTY Left 11/23/2013   Procedure: LEFT KNEE PATELLA-FEMORAL ARTHROPLASTY;  Surgeon: Gearlean Alf, MD;  [facility];  Service: Orthopedics;  Laterality: Left;  .  RIGHT OOPHORECTOMY Right 11/91   tubal ligation with complications  . TOTAL KNEE ARTHROPLASTY Left 07/05/2014   Procedure: LEFT TOTAL KNEE ARTHROPLASTY;  Surgeon: Gearlean Alf, MD;  [facility];  Service: Orthopedics;  Laterality: Left;  . TOTAL KNEE ARTHROPLASTY Right 11/11/2017   Procedure: RIGHT TOTAL KNEE ARTHROPLASTY;  Surgeon: Gaynelle Arabian, MD;  [facility];  Service: Orthopedics;  Laterality: Right;  with abductor block  . TUBAL LIGATION  1991  . WISDOM TOOTH EXTRACTION  early 20's    There were no vitals filed for this visit.   Subjective Assessment - 05/02/21 1233    Subjective "I took some tylenol. I may have overstretched my toe."    Patient Stated Goals I want to be able to increase the pace of my walk and to not have negative impact on my other joints    Currently in Pain? No/denies              [facility] PT Assessment - 05/02/21 0001      6 minute walk test results    Aerobic Endurance Distance Walked 1227                         [facility] Adult PT Treatment/Exercise - 05/02/21 0001  Ambulation/Gait   Ambulation Distance (Feet) 1227 Feet    Gait Comments see 6 MWT      Self-Care   Other Self-Care Comments  see patient education      Knee/Hip Exercises: Standing   Forward Step Up 10 reps    Forward Step Up Limitations x2 bilateral 1 round 4 inch, 1 round 6 inch      Knee/Hip Exercises: Seated   Sit to Sand 3 sets;5 reps      Knee/Hip Exercises: Sidelying   Hip ABduction 15 reps    Hip ABduction Limitations x2; 2 lbs      Knee/Hip Exercises: Prone   Straight Leg Raises 10 reps    Straight Leg Raises Limitations x2      Ankle Exercises: Standing   Other Standing Ankle Exercises tandem walks 2 x 15 ft                  PT Education - 05/02/21 1314    Education Details Complete 2 x 15 clamshells and hip abduction as part of HEP.    Person(s) Educated Patient    Methods Explanation    Comprehension Verbalized  understanding            PT Short Term Goals - 04/25/21 1332      PT SHORT TERM GOAL #1   Title Patient will be independent with initial HEP.    Baseline patient independent with current HEP    Time 2    Period Weeks    Status Achieved    Target Date 04/19/21      PT SHORT TERM GOAL #2   Title Therapist will review FOTO and anticipated functional progress.    Baseline reviewed on 4/27    Time 2    Period Weeks    Status Achieved    Target Date 04/19/21      PT SHORT TERM GOAL #3   Title Patient will demonstrate at least 10 degrees of Rt ankle dorsiflexion to improve overall gait mechanics.    Baseline see flowsheet    Time 4    Period Weeks    Status Achieved    Target Date 05/03/21      PT SHORT TERM GOAL #4   Title Patient will demonstrate at least 20 degrees of passive great toe extension to reduce stress about the foot when donning/doffing her shoes.    Baseline see flowsheet    Time 4    Period Weeks    Status On-going    Target Date 05/03/21             PT Long Term Goals - 04/27/21 1116      PT LONG TERM GOAL #1   Title Patient will walk at least 1400 ft during 6MWT to signify improvements in her overall pace of walking.    Baseline 1088 ft    Time 8    Period Weeks    Status Unable to assess      PT LONG TERM GOAL #2   Title Patient will demonstrate 5/5 hip strength to improve stability about the chain for prolonged walking activity.    Baseline see flowsheet    Time 8    Period Weeks    Status Unable to assess      PT LONG TERM GOAL #3   Title Patient will score at least 70% on FOTO to signify clinically meaningful improvement in functional abilities.    Baseline 73%    Time  8    Period Weeks    Status Achieved      PT LONG TERM GOAL #4   Title Patient will be able to maintain SLS 30 sec on right to promote improved balance and ankle control    Baseline 18 sec    Time 8    Period Weeks    Status On-going                 Plan  - 05/02/21 1248    Clinical Impression Statement Patient's 6 minute walk test has improved compared to initial evaluation with patient being able to walk 1227 ft today. She reported hip soreness towards the end of the 6 minutes, though no foot pain. Her SL dynamic balance is improving, though patient has increased sway and reports instability when completing forward step up on 6 inch compared to 4 inch relying more frequently on UE support to maintain balance. She reported fatigue at end of session, though no pain.    PT Treatment/Interventions ADLs/Self Care Home Management;Aquatic Therapy;Cryotherapy;Moist Heat;Gait training;Stair training;Therapeutic activities;Therapeutic exercise;Balance training;Neuromuscular re-education;Patient/family education;Manual techniques;Passive range of motion;Dry needling;Taping;Vasopneumatic Device    PT Next Visit Plan Review HEP and progress PRN, manual for ankle and toe mobility, scar tissue mobilization, foot intrinsic and ankle strengthening, hip strengthening, balance training    PT Home Exercise Plan Access Code: [email or web address]    Consulted and Agree with Plan of Care Patient           Patient will benefit from skilled therapeutic intervention in order to improve the following deficits and impairments:  Abnormal gait,Decreased range of motion,Difficulty walking,Decreased activity tolerance,Pain,Impaired flexibility,Decreased strength  Visit Diagnosis: S/P foot surgery  Pain in right foot  Difficulty in walking, not elsewhere classified  Stiffness of right foot, not elsewhere classified     Problem List Patient Active Problem List   Diagnosis Date Noted  . History of salpingoophorectomy 05/01/2021  . Atypical lobular hyperplasia [facility]) of right breast 05/01/2021  . Postmenopausal 05/01/2021  . Lipoma of back 12/23/2018  . Chronic kidney disease 09/26/2018  . OA (osteoarthritis) of knee 11/23/2013  . Chronic cough 01/23/2013  . Hypercholesterolemia  11/05/2011   Gwendolyn Grant, PT, DPT, ATC 05/02/21 1:32 PM  [facility] [address] [place], [place], [address] Phone: [phone]   Fax:  [phone]  Name: Diane Fox MRN: [id number] Date of Birth: July 28, 1951

## 2021-05-04 ENCOUNTER — Ambulatory Visit: Payer: Medicare Other

## 2021-05-04 ENCOUNTER — Other Ambulatory Visit: Payer: Self-pay

## 2021-05-04 DIAGNOSIS — M79671 Pain in right foot: Secondary | ICD-10-CM | POA: Diagnosis not present

## 2021-05-04 DIAGNOSIS — M25674 Stiffness of right foot, not elsewhere classified: Secondary | ICD-10-CM

## 2021-05-04 DIAGNOSIS — Z9889 Other specified postprocedural states: Secondary | ICD-10-CM | POA: Diagnosis not present

## 2021-05-04 DIAGNOSIS — R262 Difficulty in walking, not elsewhere classified: Secondary | ICD-10-CM | POA: Diagnosis not present

## 2021-05-04 NOTE — Therapy (Signed)
[place], [place], [address] Phone: [phone]   Fax:  [phone]  Physical Therapy Treatment  Patient Details  Name: Diane Fox MRN: [id number] Date of Birth: [birth year] Referring Provider (PT): Corky Sing, [place]   Encounter Date: 05/04/2021   PT End of Session - 05/04/21 0929    Visit Number 8    Number of Visits 13    Date for PT Re-Evaluation 06/03/21    Authorization Type MCR    Progress Note Due on Visit 10    PT Start Time 0930    PT Stop Time 1011    PT Time Calculation (min) 41 min    Activity Tolerance Patient tolerated treatment well    Behavior During Therapy [facility] for tasks assessed/performed           Past Medical History:  Diagnosis Date  . Arthritis    bilateral knees and hands  . Asthma    allergy related, no attacks  . Atypical chest pain 02/2014   april stress test=normal  . 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 Chronic kidney disease    GFR 58, mild  . Edema    maxide prn  . Environmental allergies   . GERD (gastroesophageal reflux disease)   . H/O measles   . H/O mumps   . Hypercholesterolemia   . PONV (postoperative nausea and vomiting)     Past Surgical History:  Procedure Laterality Date  . BREAST BIOPSY Right 09/2002   focal atypical lobular hyperplasia  . BREAST SURGERY Right 10/03   focal atypia hyperplasia  . DILATION AND CURETTAGE OF UTERUS     x2  . FOOT SURGERY Right    Bunion  . HYSTEROSCOPY WITH D & C  07/2007   polyp removed-benign  . LIPOMA EXCISION Right 01/15/2019   Procedure: EXCISION RIGHT BACK LIPOMA;  Surgeon: Wallace Going, DO;  Location: [place];  Service: Plastics;  Laterality: Right;  . PATELLA-FEMORAL ARTHROPLASTY Left 11/23/2013   Procedure: LEFT KNEE PATELLA-FEMORAL ARTHROPLASTY;  Surgeon: Gearlean Alf, MD;  [facility];  Service: Orthopedics;  Laterality: Left;  .  RIGHT OOPHORECTOMY Right 11/91   tubal ligation with complications  . TOTAL KNEE ARTHROPLASTY Left 07/05/2014   Procedure: LEFT TOTAL KNEE ARTHROPLASTY;  Surgeon: Gearlean Alf, MD;  [facility];  Service: Orthopedics;  Laterality: Left;  . TOTAL KNEE ARTHROPLASTY Right 11/11/2017   Procedure: RIGHT TOTAL KNEE ARTHROPLASTY;  Surgeon: Gaynelle Arabian, MD;  [facility];  Service: Orthopedics;  Laterality: Right;  with abductor block  . TUBAL LIGATION  1991  . WISDOM TOOTH EXTRACTION  early 20's    There were no vitals filed for this visit.   Subjective Assessment - 05/04/21 0932    Subjective Patient reports she is doing well without reports of pain. She was sore after last session that lasted the rest of the day.    Patient Stated Goals I want to be able to increase the pace of my walk and to not have negative impact on my other joints    Currently in Pain? No/denies                             [facility] Adult PT Treatment/Exercise - 05/04/21 0001      Self-Care   Other Self-Care Comments  see patient education  Neuro Re-ed    Neuro Re-ed Details  tandem on airex 3 x 30 sec; SLS 3 x 30 sec each, marching on airex 2 x 20; romberg eyes closed on foam 3 x 30 sec      Knee/Hip Exercises: Stretches   Press photographer 60 seconds    Gastroc Stretch Limitations slant board    Soleus Stretch 60 seconds    Soleus Stretch Limitations slantboard      Knee/Hip Exercises: Supine   Bridges 15 reps    Bridges Limitations x2    Straight Leg Raises Limitations 2 x 12; 1# bilateral      Knee/Hip Exercises: Sidelying   Clams 2x  10 black band      Ankle Exercises: Standing   Heel Raises 15 reps    Heel Raises Limitations x2 on airex                  PT Education - 05/04/21 9937    Education Details Updated HEP.    Person(s) Educated Patient    Methods Explanation;Verbal cues;Handout;Demonstration    Comprehension Verbalized understanding;Returned  demonstration            PT Short Term Goals - 04/25/21 1332      PT SHORT TERM GOAL #1   Title Patient will be independent with initial HEP.    Baseline patient independent with current HEP    Time 2    Period Weeks    Status Achieved    Target Date 04/19/21      PT SHORT TERM GOAL #2   Title Therapist will review FOTO and anticipated functional progress.    Baseline reviewed on 4/27    Time 2    Period Weeks    Status Achieved    Target Date 04/19/21      PT SHORT TERM GOAL #3   Title Patient will demonstrate at least 10 degrees of Rt ankle dorsiflexion to improve overall gait mechanics.    Baseline see flowsheet    Time 4    Period Weeks    Status Achieved    Target Date 05/03/21      PT SHORT TERM GOAL #4   Title Patient will demonstrate at least 20 degrees of passive great toe extension to reduce stress about the foot when donning/doffing her shoes.    Baseline see flowsheet    Time 4    Period Weeks    Status On-going    Target Date 05/03/21             PT Long Term Goals - 04/27/21 1116      PT LONG TERM GOAL #1   Title Patient will walk at least 1400 ft during 6MWT to signify improvements in her overall pace of walking.    Baseline 1088 ft    Time 8    Period Weeks    Status Unable to assess      PT LONG TERM GOAL #2   Title Patient will demonstrate 5/5 hip strength to improve stability about the chain for prolonged walking activity.    Baseline see flowsheet    Time 8    Period Weeks    Status Unable to assess      PT LONG TERM GOAL #3   Title Patient will score at least 70% on FOTO to signify clinically meaningful improvement in functional abilities.    Baseline 73%    Time 8    Period Weeks    Status  Achieved      PT LONG TERM GOAL #4   Title Patient will be able to maintain SLS 30 sec on right to promote improved balance and ankle control    Baseline 18 sec    Time 8    Period Weeks    Status On-going                 Plan  - 05/04/21 0933    Clinical Impression Statement Patient tolerated session well today focusing on balance training. She is demonstrating improved postural control with narrow stance balance activity, though remains challenged with SL activity requiring intermittent implementation of reaching strategy during SLS on the Rt. She reported fatigue in the Rt foot following static balance training, though no pain. Her tolerance to standing activity is improving as she was able to complete over half the session in standing prior to requiring seated rest break.    PT Treatment/Interventions ADLs/Self Care Home Management;Aquatic Therapy;Cryotherapy;Moist Heat;Gait training;Stair training;Therapeutic activities;Therapeutic exercise;Balance training;Neuromuscular re-education;Patient/family education;Manual techniques;Passive range of motion;Dry needling;Taping;Vasopneumatic Device    PT Next Visit Plan Review HEP and progress PRN, manual for ankle and toe mobility, scar tissue mobilization, foot intrinsic and ankle strengthening, hip strengthening, balance training    PT Home Exercise Plan Access Code: [email or web address]    Consulted and Agree with Plan of Care Patient           Patient will benefit from skilled therapeutic intervention in order to improve the following deficits and impairments:  Abnormal gait,Decreased range of motion,Difficulty walking,Decreased activity tolerance,Pain,Impaired flexibility,Decreased strength  Visit Diagnosis: S/P foot surgery  Pain in right foot  Difficulty in walking, not elsewhere classified  Stiffness of right foot, not elsewhere classified     Problem List Patient Active Problem List   Diagnosis Date Noted  . History of salpingoophorectomy 05/01/2021  . Atypical lobular hyperplasia [facility]) of right breast 05/01/2021  . Postmenopausal 05/01/2021  . Lipoma of back 12/23/2018  . Chronic kidney disease 09/26/2018  . OA (osteoarthritis) of knee 11/23/2013  . Chronic  cough 01/23/2013  . Hypercholesterolemia 11/05/2011   Gwendolyn Grant, PT, DPT, ATC 05/04/21 10:13 AM  [facility] [address] [place], [place], [address] Phone: [phone]   Fax:  [phone]  Name: Diane Fox MRN: [id number] Date of Birth: August 03, 1951

## 2021-05-09 ENCOUNTER — Ambulatory Visit: Payer: Medicare Other

## 2021-05-09 ENCOUNTER — Other Ambulatory Visit: Payer: Self-pay

## 2021-05-09 DIAGNOSIS — M79671 Pain in right foot: Secondary | ICD-10-CM

## 2021-05-09 DIAGNOSIS — R262 Difficulty in walking, not elsewhere classified: Secondary | ICD-10-CM | POA: Diagnosis not present

## 2021-05-09 DIAGNOSIS — M25674 Stiffness of right foot, not elsewhere classified: Secondary | ICD-10-CM | POA: Diagnosis not present

## 2021-05-09 DIAGNOSIS — Z9889 Other specified postprocedural states: Secondary | ICD-10-CM

## 2021-05-09 NOTE — Therapy (Signed)
[place], [place], [address] Phone: [phone]   Fax:  [phone]  Physical Therapy Treatment/Progress Note  Progress Note Reporting Period 04/05/21 to 05/09/21  See note below for Objective Data and Assessment of Progress/Goals.       Patient Details  Name: JOHNANNA Fox MRN: [id number] Date of Birth: [birth year] Referring Provider (PT): Corky Sing, [place]   Encounter Date: 05/09/2021   PT End of Session - 05/09/21 1016    Visit Number 9    Number of Visits 13    Date for PT Re-Evaluation 06/03/21    Authorization Type MCR    Progress Note Due on Visit 19    PT Start Time 1016    PT Stop Time 1059    PT Time Calculation (min) 43 min    Activity Tolerance Patient tolerated treatment well    Behavior During Therapy [facility] for tasks assessed/performed           Past Medical History:  Diagnosis Date  . Arthritis    bilateral knees and hands  . Asthma    allergy related, no attacks  . Atypical chest pain 02/2014   april stress test=normal  . 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 Chronic kidney disease    GFR 58, mild  . Edema    maxide prn  . Environmental allergies   . GERD (gastroesophageal reflux disease)   . H/O measles   . H/O mumps   . Hypercholesterolemia   . PONV (postoperative nausea and vomiting)     Past Surgical History:  Procedure Laterality Date  . BREAST BIOPSY Right 09/2002   focal atypical lobular hyperplasia  . BREAST SURGERY Right 10/03   focal atypia hyperplasia  . DILATION AND CURETTAGE OF UTERUS     x2  . FOOT SURGERY Right    Bunion  . HYSTEROSCOPY WITH D & C  07/2007   polyp removed-benign  . LIPOMA EXCISION Right 01/15/2019   Procedure: EXCISION RIGHT BACK LIPOMA;  Surgeon: Wallace Going, DO;  Location: [place];  Service: Plastics;  Laterality: Right;  . PATELLA-FEMORAL ARTHROPLASTY Left 11/23/2013    Procedure: LEFT KNEE PATELLA-FEMORAL ARTHROPLASTY;  Surgeon: Gearlean Alf, MD;  Location[facility];  Service: Orthopedics;  Laterality: Left;  . RIGHT OOPHORECTOMY Right 11/91   tubal ligation with complications  . TOTAL KNEE ARTHROPLASTY Left 07/05/2014   Procedure: LEFT TOTAL KNEE ARTHROPLASTY;  Surgeon: Gearlean Alf, MD;  Location: [facility];  Service: Orthopedics;  Laterality: Left;  . TOTAL KNEE ARTHROPLASTY Right 11/11/2017   Procedure: RIGHT TOTAL KNEE ARTHROPLASTY;  Surgeon: Gaynelle Arabian, MD;  Location[facility];  Service: Orthopedics;  Laterality: Right;  with abductor block  . TUBAL LIGATION  1991  . WISDOM TOOTH EXTRACTION  early 20's    There were no vitals filed for this visit.   Subjective Assessment - 05/09/21 1027    Subjective Patient reports she is doing well without reports of pain. Patient reports subjective overall improvement of 65%. She feels that she needs continued focus on endurance and to improve toe mobility.    How long can you stand comfortably? about 30 minutes    How long can you walk comfortably? 35 minutes    Patient Stated Goals I want to be able to increase the pace of my walk and to not have negative impact on my other joints    Currently in Pain?  No/denies              [facility] PT Assessment - 05/09/21 0001      AROM   Right Ankle Dorsiflexion 10      PROM   Overall PROM Comments great toe passive extension 20 degrees      Strength   Right Hip Flexion 5/5    Right Hip Extension 4+/5    Right Hip ABduction 4/5    Left Hip Flexion 5/5    Left Hip Extension 4+/5    Left Hip ABduction 4/5      6 minute walk test results    Aerobic Endurance Distance Walked 1275                         [facility] Adult PT Treatment/Exercise - 05/09/21 0001      Self-Care   Other Self-Care Comments  see patient education      Knee/Hip Exercises: Machines for Strengthening   Total Gym Leg Press 2 x 15; 40 lbs      Knee/Hip Exercises: Standing    Abduction Limitations 2 x 15; red band bilateral    Extension Limitations 2 x 15; red band bilaterally    SLS 3 trials RLE, 9 sec, 9 sec, 18 sec      Ankle Exercises: Seated   Marble Pickup 15 marbles RLE                  PT Education - 05/09/21 1203    Education Details Education on overall functional progress.    Person(s) Educated Patient    Methods Explanation    Comprehension Verbalized understanding            PT Short Term Goals - 05/09/21 1034      PT SHORT TERM GOAL #1   Title Patient will be independent with initial HEP.    Baseline patient independent with current HEP    Time 2    Period Weeks    Status Achieved    Target Date 04/19/21      PT SHORT TERM GOAL #2   Title Therapist will review FOTO and anticipated functional progress.    Baseline reviewed on 4/27    Time 2    Period Weeks    Status Achieved    Target Date 04/19/21      PT SHORT TERM GOAL #3   Title Patient will demonstrate at least 10 degrees of Rt ankle dorsiflexion to improve overall gait mechanics.    Baseline see flowsheet    Time 4    Period Weeks    Status Achieved    Target Date 05/03/21      PT SHORT TERM GOAL #4   Title Patient will demonstrate at least 20 degrees of passive great toe extension to reduce stress about the foot when donning/doffing her shoes.    Baseline see flowsheet    Time 4    Period Weeks    Status Achieved    Target Date 05/03/21             PT Long Term Goals - 05/09/21 1029      PT LONG TERM GOAL #1   Title Patient will walk at least 1400 ft during 6MWT to signify improvements in her overall pace of walking.    Baseline 1275    Time 8    Period Weeks    Status On-going      PT LONG  TERM GOAL #2   Title Patient will demonstrate 5/5 hip strength to improve stability about the chain for prolonged walking activity.    Baseline see flowsheet    Time 8    Period Weeks    Status Partially Met      PT LONG TERM GOAL #3   Title  Patient will score at least 70% on FOTO to signify clinically meaningful improvement in functional abilities.    Baseline 73%    Time 8    Period Weeks    Status Achieved      PT LONG TERM GOAL #4   Title Patient will be able to maintain SLS 30 sec on right to promote improved balance and ankle control    Baseline 18 sec    Time 8    Period Weeks    Status On-going                 Plan - 05/09/21 1048    Clinical Impression Statement Patient is progressing well since start of care having met all short term functional goals and making good progress towards long term functional goals. Her dorsiflexion AROM and great toe extension ROM have improved compared to baseline. Her 6MWT distance has improved compared to baseline, though she continues to report bilateral hip soreness within 2 minutes of test. Her hip strength is gradually improving, though continues to have hip extensors and abductor weakness. She is progressing well with balance training, though requires continued emphasis on improving RLE static and dynamic balance. She will benefit from continuing with current POC to further progress her strength, mobility, and balance in order to return to PLOF.    PT Treatment/Interventions ADLs/Self Care Home Management;Aquatic Therapy;Cryotherapy;Moist Heat;Gait training;Stair training;Therapeutic activities;Therapeutic exercise;Balance training;Neuromuscular re-education;Patient/family education;Manual techniques;Passive range of motion;Dry needling;Taping;Vasopneumatic Device    PT Next Visit Plan Review HEP and progress PRN, manual for ankle and toe mobility, scar tissue mobilization, foot intrinsic and ankle strengthening, hip strengthening, balance training    PT Home Exercise Plan Access Code: [email or web address]    Consulted and Agree with Plan of Care Patient           Patient will benefit from skilled therapeutic intervention in order to improve the following deficits and impairments:   Abnormal gait,Decreased range of motion,Difficulty walking,Decreased activity tolerance,Pain,Impaired flexibility,Decreased strength  Visit Diagnosis: S/P foot surgery  Pain in right foot  Difficulty in walking, not elsewhere classified  Stiffness of right foot, not elsewhere classified     Problem List Patient Active Problem List   Diagnosis Date Noted  . History of salpingoophorectomy 05/01/2021  . Atypical lobular hyperplasia [facility]) of right breast 05/01/2021  . Postmenopausal 05/01/2021  . Lipoma of back 12/23/2018  . Chronic kidney disease 09/26/2018  . OA (osteoarthritis) of knee 11/23/2013  . Chronic cough 01/23/2013  . Hypercholesterolemia 11/05/2011   Gwendolyn Grant, PT, DPT, ATC 05/09/21 12:09 PM  [facility] [address] [place], [place], [address] Phone: [phone]   Fax:  [phone]  Name: Diane Fox MRN: [id number] Date of Birth: 1951/01/24

## 2021-05-10 DIAGNOSIS — M21611 Bunion of right foot: Secondary | ICD-10-CM | POA: Diagnosis not present

## 2021-05-11 ENCOUNTER — Ambulatory Visit: Payer: Medicare Other

## 2021-05-11 ENCOUNTER — Other Ambulatory Visit: Payer: Self-pay

## 2021-05-11 DIAGNOSIS — Z9889 Other specified postprocedural states: Secondary | ICD-10-CM | POA: Diagnosis not present

## 2021-05-11 DIAGNOSIS — M79671 Pain in right foot: Secondary | ICD-10-CM | POA: Diagnosis not present

## 2021-05-11 DIAGNOSIS — R262 Difficulty in walking, not elsewhere classified: Secondary | ICD-10-CM | POA: Diagnosis not present

## 2021-05-11 DIAGNOSIS — M25674 Stiffness of right foot, not elsewhere classified: Secondary | ICD-10-CM

## 2021-05-11 NOTE — Therapy (Signed)
[place], [place], [address] Phone: [phone]   Fax:  [phone]  Physical Therapy Treatment  Patient Details  Name: SHANTAYA BLUESTONE MRN: [id number] Date of Birth: 06/29/1951 Referring Provider (PT): Corky Sing, [place]   Encounter Date: 05/11/2021   PT End of Session - 05/11/21 0930    Visit Number 10    Number of Visits 13    Date for PT Re-Evaluation 06/03/21    Authorization Type MCR    Progress Note Due on Visit 19    PT Start Time 0930    PT Stop Time 1012    PT Time Calculation (min) 42 min    Activity Tolerance Patient tolerated treatment well    Behavior During Therapy [facility] for tasks assessed/performed           Past Medical History:  Diagnosis Date  . Arthritis    bilateral knees and hands  . Asthma    allergy related, no attacks  . Atypical chest pain 02/2014   april stress test=normal  . 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 Chronic kidney disease    GFR 58, mild  . Edema    maxide prn  . Environmental allergies   . GERD (gastroesophageal reflux disease)   . H/O measles   . H/O mumps   . Hypercholesterolemia   . PONV (postoperative nausea and vomiting)     Past Surgical History:  Procedure Laterality Date  . BREAST BIOPSY Right 09/2002   focal atypical lobular hyperplasia  . BREAST SURGERY Right 10/03   focal atypia hyperplasia  . DILATION AND CURETTAGE OF UTERUS     x2  . FOOT SURGERY Right    Bunion  . HYSTEROSCOPY WITH D & C  07/2007   polyp removed-benign  . LIPOMA EXCISION Right 01/15/2019   Procedure: EXCISION RIGHT BACK LIPOMA;  Surgeon: Wallace Going, DO;  Location: [place];  Service: Plastics;  Laterality: Right;  . PATELLA-FEMORAL ARTHROPLASTY Left 11/23/2013   Procedure: LEFT KNEE PATELLA-FEMORAL ARTHROPLASTY;  Surgeon: Gearlean Alf, MD;  [facility];  Service: Orthopedics;  Laterality: Left;  .  RIGHT OOPHORECTOMY Right 11/91   tubal ligation with complications  . TOTAL KNEE ARTHROPLASTY Left 07/05/2014   Procedure: LEFT TOTAL KNEE ARTHROPLASTY;  Surgeon: Gearlean Alf, MD;  [facility];  Service: Orthopedics;  Laterality: Left;  . TOTAL KNEE ARTHROPLASTY Right 11/11/2017   Procedure: RIGHT TOTAL KNEE ARTHROPLASTY;  Surgeon: Gaynelle Arabian, MD;  [facility];  Service: Orthopedics;  Laterality: Right;  with abductor block  . TUBAL LIGATION  1991  . WISDOM TOOTH EXTRACTION  early 20's    There were no vitals filed for this visit.   Subjective Assessment - 05/11/21 0930    Subjective "I hurt my back yesterday putting on my sock. I think I pulled a muscle." She reports the foot is feeling fine and had f/u with PA yesterday who is pleased with her progress.    How long can you stand comfortably? about 30 minutes    How long can you walk comfortably? 35 minutes    Patient Stated Goals I want to be able to increase the pace of my walk and to not have negative impact on my other joints    Currently in Pain? Yes    Pain Score 7     Pain Location Back    Pain Orientation Lower  Pain Descriptors / Indicators Aching    Pain Type Acute pain    Pain Onset Yesterday    Pain Frequency Intermittent              [facility] PT Assessment - 05/11/21 0001      Observation/Other Assessments   Focus on Therapeutic Outcomes (FOTO)  72% function                         [facility] Adult PT Treatment/Exercise - 05/11/21 0001      Self-Care   Other Self-Care Comments  see patient education      Neuro Re-ed    Neuro Re-ed Details  tandem walks 2 x 15 ft; SLS 3 x 30 sec bilateral      Knee/Hip Exercises: Machines for Strengthening   Total Gym Leg Press 2 x 15; 40 lbs      Knee/Hip Exercises: Standing   Other Standing Knee Exercises attempted 3 way hip, though increaed back pain      Ankle Exercises: Seated   Marble Pickup 15 marbles RLE    Other Seated Ankle  Exercises 4 way ankle 2 x 10 green band RLE      Ankle Exercises: Standing   Rocker Board --   2 x 10; A/P   Heel Raises 15 reps    Heel Raises Limitations x2    Heel Walk (Round Trip) 10 ft    Toe Walk (Round Trip) 81f                  PT Education - 05/11/21 0936    Education Details FOTO score.    Person(s) Educated Patient    Methods Explanation    Comprehension Verbalized understanding            PT Short Term Goals - 05/09/21 1034      PT SHORT TERM GOAL #1   Title Patient will be independent with initial HEP.    Baseline patient independent with current HEP    Time 2    Period Weeks    Status Achieved    Target Date 04/19/21      PT SHORT TERM GOAL #2   Title Therapist will review FOTO and anticipated functional progress.    Baseline reviewed on 4/27    Time 2    Period Weeks    Status Achieved    Target Date 04/19/21      PT SHORT TERM GOAL #3   Title Patient will demonstrate at least 10 degrees of Rt ankle dorsiflexion to improve overall gait mechanics.    Baseline see flowsheet    Time 4    Period Weeks    Status Achieved    Target Date 05/03/21      PT SHORT TERM GOAL #4   Title Patient will demonstrate at least 20 degrees of passive great toe extension to reduce stress about the foot when donning/doffing her shoes.    Baseline see flowsheet    Time 4    Period Weeks    Status Achieved    Target Date 05/03/21             PT Long Term Goals - 05/09/21 1029      PT LONG TERM GOAL #1   Title Patient will walk at least 1400 ft during 6MWT to signify improvements in her overall pace of walking.    Baseline 1275    Time 8  Period Weeks    Status On-going      PT LONG TERM GOAL #2   Title Patient will demonstrate 5/5 hip strength to improve stability about the chain for prolonged walking activity.    Baseline see flowsheet    Time 8    Period Weeks    Status Partially Met      PT LONG TERM GOAL #3   Title Patient will score  at least 70% on FOTO to signify clinically meaningful improvement in functional abilities.    Baseline 73%    Time 8    Period Weeks    Status Achieved      PT LONG TERM GOAL #4   Title Patient will be able to maintain SLS 30 sec on right to promote improved balance and ankle control    Baseline 18 sec    Time 8    Period Weeks    Status On-going                 Plan - 05/11/21 0935    Clinical Impression Statement Patient arrives with acute low back ache that occured when putting on her socks yesterday. Session focused on ankle/foot intrinsic strengthening and balance training. She tolerated ther ex well with exception of 3 way hip as this caused increased low back pain, so exercise was terminated. Her single leg balance is improving on the RLE with ability to maintain for 25 seconds during one trial today before requiring UE support.    PT Treatment/Interventions ADLs/Self Care Home Management;Aquatic Therapy;Cryotherapy;Moist Heat;Gait training;Stair training;Therapeutic activities;Therapeutic exercise;Balance training;Neuromuscular re-education;Patient/family education;Manual techniques;Passive range of motion;Dry needling;Taping;Vasopneumatic Device    PT Next Visit Plan Review HEP and progress PRN, manual for ankle and toe mobility, scar tissue mobilization, foot intrinsic and ankle strengthening, hip strengthening, balance training    PT Home Exercise Plan Access Code: [email or web address]    Consulted and Agree with Plan of Care Patient           Patient will benefit from skilled therapeutic intervention in order to improve the following deficits and impairments:  Abnormal gait,Decreased range of motion,Difficulty walking,Decreased activity tolerance,Pain,Impaired flexibility,Decreased strength  Visit Diagnosis: S/P foot surgery  Pain in right foot  Difficulty in walking, not elsewhere classified  Stiffness of right foot, not elsewhere classified     Problem List Patient  Active Problem List   Diagnosis Date Noted  . History of salpingoophorectomy 05/01/2021  . Atypical lobular hyperplasia [facility]) of right breast 05/01/2021  . Postmenopausal 05/01/2021  . Lipoma of back 12/23/2018  . Chronic kidney disease 09/26/2018  . OA (osteoarthritis) of knee 11/23/2013  . Chronic cough 01/23/2013  . Hypercholesterolemia 11/05/2011   Gwendolyn Grant, PT, DPT, ATC 05/11/21 10:13 AM  [facility] [address] [place], [place], [address] Phone: [phone]   Fax:  [phone]  Name: HORTENSE CANTRALL MRN: [id number] Date of Birth: 1951/11/21

## 2021-05-16 ENCOUNTER — Ambulatory Visit: Payer: Medicare Other

## 2021-05-16 ENCOUNTER — Other Ambulatory Visit: Payer: Self-pay

## 2021-05-16 ENCOUNTER — Ambulatory Visit (HOSPITAL_BASED_OUTPATIENT_CLINIC_OR_DEPARTMENT_OTHER): Payer: Medicare Other | Admitting: Obstetrics & Gynecology

## 2021-05-16 DIAGNOSIS — R262 Difficulty in walking, not elsewhere classified: Secondary | ICD-10-CM | POA: Diagnosis not present

## 2021-05-16 DIAGNOSIS — M79671 Pain in right foot: Secondary | ICD-10-CM

## 2021-05-16 DIAGNOSIS — Z9889 Other specified postprocedural states: Secondary | ICD-10-CM | POA: Diagnosis not present

## 2021-05-16 DIAGNOSIS — M25674 Stiffness of right foot, not elsewhere classified: Secondary | ICD-10-CM

## 2021-05-16 NOTE — Therapy (Signed)
[place], [place], [address] Phone: [phone]   Fax:  [phone]  Physical Therapy Treatment  Patient Details  Name: ALEXI DORMINEY MRN: [id number] Date of Birth: May 11, 1951 Referring Provider (PT): Corky Sing, [place]   Encounter Date: 05/16/2021   PT End of Session - 05/16/21 1018    Visit Number 11    Number of Visits 13    Date for PT Re-Evaluation 06/03/21    Authorization Type MCR    Progress Note Due on Visit 19    PT Start Time 1018    PT Stop Time 1059    PT Time Calculation (min) 41 min    Activity Tolerance Patient tolerated treatment well    Behavior During Therapy [facility] for tasks assessed/performed           Past Medical History:  Diagnosis Date  . Arthritis    bilateral knees and hands  . Asthma    allergy related, no attacks  . Atypical chest pain 02/2014   april stress test=normal  . 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 Chronic kidney disease    GFR 58, mild  . Edema    maxide prn  . Environmental allergies   . GERD (gastroesophageal reflux disease)   . H/O measles   . H/O mumps   . Hypercholesterolemia   . PONV (postoperative nausea and vomiting)     Past Surgical History:  Procedure Laterality Date  . BREAST BIOPSY Right 09/2002   focal atypical lobular hyperplasia  . BREAST SURGERY Right 10/03   focal atypia hyperplasia  . DILATION AND CURETTAGE OF UTERUS     x2  . FOOT SURGERY Right    Bunion  . HYSTEROSCOPY WITH D & C  07/2007   polyp removed-benign  . LIPOMA EXCISION Right 01/15/2019   Procedure: EXCISION RIGHT BACK LIPOMA;  Surgeon: Wallace Going, DO;  Location: [place];  Service: Plastics;  Laterality: Right;  . PATELLA-FEMORAL ARTHROPLASTY Left 11/23/2013   Procedure: LEFT KNEE PATELLA-FEMORAL ARTHROPLASTY;  Surgeon: Gearlean Alf, MD;  Location: [facility];  Service: Orthopedics;  Laterality: Left;  .  RIGHT OOPHORECTOMY Right 11/91   tubal ligation with complications  . TOTAL KNEE ARTHROPLASTY Left 07/05/2014   Procedure: LEFT TOTAL KNEE ARTHROPLASTY;  Surgeon: Gearlean Alf, MD;  Location: [facility];  Service: Orthopedics;  Laterality: Left;  . TOTAL KNEE ARTHROPLASTY Right 11/11/2017   Procedure: RIGHT TOTAL KNEE ARTHROPLASTY;  Surgeon: Gaynelle Arabian, MD;  Location: [facility];  Service: Orthopedics;  Laterality: Right;  with abductor block  . TUBAL LIGATION  1991  . WISDOM TOOTH EXTRACTION  early 20's    There were no vitals filed for this visit.   Subjective Assessment - 05/16/21 1020    Subjective "My back is still tender. The only exercise I couldn't do was the bridge."    How long can you stand comfortably? about 30 minutes    How long can you walk comfortably? 35 minutes    Patient Stated Goals I want to be able to increase the pace of my walk and to not have negative impact on my other joints    Currently in Pain? No/denies              [facility] PT Assessment - 05/16/21 0001      6 minute walk test results    Aerobic Endurance Distance Walked 1320  [place] Adult PT Treatment/Exercise - 05/16/21 0001      Self-Care   Other Self-Care Comments  see patient education      Knee/Hip Exercises: Standing   Functional Squat 10 reps    Functional Squat Limitations x2    Other Standing Knee Exercises marching on airex 2 x 20      Knee/Hip Exercises: Supine   Straight Leg Raises Limitations 2 x 12; 2# bilateral      Knee/Hip Exercises: Sidelying   Hip ABduction 15 reps    Hip ABduction Limitations x2; 2 lbs      Ankle Exercises: Seated   Marble Pickup 15 marbles RLE      Ankle Exercises: Stretches   Slant Board Stretch 60 seconds      Ankle Exercises: Standing   Heel Raises 10 reps    Heel Raises Limitations x2; SL                  PT Education - 05/16/21 1050    Education Details Updated HEP.    Person(s) Educated  Patient    Methods Explanation;Demonstration;Verbal cues;Handout    Comprehension Verbalized understanding;Returned demonstration;Verbal cues required            PT Short Term Goals - 05/09/21 1034      PT SHORT TERM GOAL #1   Title Patient will be independent with initial HEP.    Baseline patient independent with current HEP    Time 2    Period Weeks    Status Achieved    Target Date 04/19/21      PT SHORT TERM GOAL #2   Title Therapist will review FOTO and anticipated functional progress.    Baseline reviewed on 4/27    Time 2    Period Weeks    Status Achieved    Target Date 04/19/21      PT SHORT TERM GOAL #3   Title Patient will demonstrate at least 10 degrees of Rt ankle dorsiflexion to improve overall gait mechanics.    Baseline see flowsheet    Time 4    Period Weeks    Status Achieved    Target Date 05/03/21      PT SHORT TERM GOAL #4   Title Patient will demonstrate at least 20 degrees of passive great toe extension to reduce stress about the foot when donning/doffing her shoes.    Baseline see flowsheet    Time 4    Period Weeks    Status Achieved    Target Date 05/03/21             PT Long Term Goals - 05/09/21 1029      PT LONG TERM GOAL #1   Title Patient will walk at least 1400 ft during 6MWT to signify improvements in her overall pace of walking.    Baseline 1275    Time 8    Period Weeks    Status On-going      PT LONG TERM GOAL #2   Title Patient will demonstrate 5/5 hip strength to improve stability about the chain for prolonged walking activity.    Baseline see flowsheet    Time 8    Period Weeks    Status Partially Met      PT LONG TERM GOAL #3   Title Patient will score at least 70% on FOTO to signify clinically meaningful improvement in functional abilities.    Baseline 73%    Time 8    Period  Weeks    Status Achieved      PT LONG TERM GOAL #4   Title Patient will be able to maintain SLS 30 sec on right to promote improved  balance and ankle control    Baseline 18 sec    Time 8    Period Weeks    Status On-going                 Plan - 05/16/21 1031    Clinical Impression Statement Patient's 6MWT distance continues to improve, nearing this long term functional goal. Her digit AROM is improving as she is able to better perform marble pickups with digits 2-5 on the RLE. She was able to complete partial range of SL calf raise on the RLE without reports of pain.    PT Treatment/Interventions ADLs/Self Care Home Management;Aquatic Therapy;Cryotherapy;Moist Heat;Gait training;Stair training;Therapeutic activities;Therapeutic exercise;Balance training;Neuromuscular re-education;Patient/family education;Manual techniques;Passive range of motion;Dry needling;Taping;Vasopneumatic Device    PT Next Visit Plan Review HEP and progress PRN, manual for ankle and toe mobility, scar tissue mobilization, foot intrinsic and ankle strengthening, hip strengthening, balance training    PT Home Exercise Plan Access Code: [email or web address]    Consulted and Agree with Plan of Care Patient           Patient will benefit from skilled therapeutic intervention in order to improve the following deficits and impairments:  Abnormal gait,Decreased range of motion,Difficulty walking,Decreased activity tolerance,Pain,Impaired flexibility,Decreased strength  Visit Diagnosis: S/P foot surgery  Pain in right foot  Difficulty in walking, not elsewhere classified  Stiffness of right foot, not elsewhere classified     Problem List Patient Active Problem List   Diagnosis Date Noted  . History of salpingoophorectomy 05/01/2021  . Atypical lobular hyperplasia [facility]) of right breast 05/01/2021  . Postmenopausal 05/01/2021  . Lipoma of back 12/23/2018  . Chronic kidney disease 09/26/2018  . OA (osteoarthritis) of knee 11/23/2013  . Chronic cough 01/23/2013  . Hypercholesterolemia 11/05/2011   Gwendolyn Grant, PT, DPT, ATC 05/16/21 11:07  AM  [place] [facility] [address] [place], [place], [address] Phone: [phone]   Fax:  [phone]  Name: KIANTE PETROVICH MRN: [id number] Date of Birth: 09/25/1951

## 2021-05-22 ENCOUNTER — Other Ambulatory Visit: Payer: Self-pay | Admitting: Family Medicine

## 2021-05-22 DIAGNOSIS — Z1231 Encounter for screening mammogram for malignant neoplasm of breast: Secondary | ICD-10-CM

## 2021-05-23 ENCOUNTER — Ambulatory Visit: Payer: Medicare Other

## 2021-05-24 ENCOUNTER — Ambulatory Visit: Payer: Medicare Other | Attending: Student

## 2021-05-24 ENCOUNTER — Other Ambulatory Visit: Payer: Self-pay

## 2021-05-24 DIAGNOSIS — M79671 Pain in right foot: Secondary | ICD-10-CM | POA: Insufficient documentation

## 2021-05-24 DIAGNOSIS — M25674 Stiffness of right foot, not elsewhere classified: Secondary | ICD-10-CM

## 2021-05-24 DIAGNOSIS — Z9889 Other specified postprocedural states: Secondary | ICD-10-CM

## 2021-05-24 DIAGNOSIS — R262 Difficulty in walking, not elsewhere classified: Secondary | ICD-10-CM | POA: Diagnosis not present

## 2021-05-24 NOTE — Therapy (Signed)
[place], [place], [address] Phone: [phone]   Fax:  [phone]  Physical Therapy Treatment  Patient Details  Name: Diane Fox MRN: [id number] Date of Birth: Jul 20, 1951 Referring Provider (PT): Corky Sing, [place]   Encounter Date: 05/24/2021   PT End of Session - 05/24/21 1150    Visit Number 12    Number of Visits 13    Date for PT Re-Evaluation 06/03/21    Authorization Type MCR    Progress Note Due on Visit 71    PT Start Time 1146    PT Stop Time 1229    PT Time Calculation (min) 43 min    Activity Tolerance Patient tolerated treatment well    Behavior During Therapy [facility] for tasks assessed/performed           Past Medical History:  Diagnosis Date  . Arthritis    bilateral knees and hands  . Asthma    allergy related, no attacks  . Atypical chest pain 02/2014   april stress test=normal  . 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 Chronic kidney disease    GFR 58, mild  . Edema    maxide prn  . Environmental allergies   . GERD (gastroesophageal reflux disease)   . H/O measles   . H/O mumps   . Hypercholesterolemia   . PONV (postoperative nausea and vomiting)     Past Surgical History:  Procedure Laterality Date  . BREAST BIOPSY Right 09/2002   focal atypical lobular hyperplasia  . BREAST SURGERY Right 10/03   focal atypia hyperplasia  . DILATION AND CURETTAGE OF UTERUS     x2  . FOOT SURGERY Right    Bunion  . HYSTEROSCOPY WITH D & C  07/2007   polyp removed-benign  . LIPOMA EXCISION Right 01/15/2019   Procedure: EXCISION RIGHT BACK LIPOMA;  Surgeon: Wallace Going, DO;  Location: [place];  Service: Plastics;  Laterality: Right;  . PATELLA-FEMORAL ARTHROPLASTY Left 11/23/2013   Procedure: LEFT KNEE PATELLA-FEMORAL ARTHROPLASTY;  Surgeon: Gearlean Alf, MD;  [facility];  Service: Orthopedics;  Laterality: Left;  .  RIGHT OOPHORECTOMY Right 11/91   tubal ligation with complications  . TOTAL KNEE ARTHROPLASTY Left 07/05/2014   Procedure: LEFT TOTAL KNEE ARTHROPLASTY;  Surgeon: Gearlean Alf, MD;  [facility];  Service: Orthopedics;  Laterality: Left;  . TOTAL KNEE ARTHROPLASTY Right 11/11/2017   Procedure: RIGHT TOTAL KNEE ARTHROPLASTY;  Surgeon: Gaynelle Arabian, MD;  Location[facility];  Service: Orthopedics;  Laterality: Right;  with abductor block  . TUBAL LIGATION  1991  . WISDOM TOOTH EXTRACTION  early 20's    There were no vitals filed for this visit.   Subjective Assessment - 05/24/21 1148    Subjective "It's ok. I still know that it's there."    How long can you stand comfortably? about 30 minutes    How long can you walk comfortably? 35 minutes    Patient Stated Goals I want to be able to increase the pace of my walk and to not have negative impact on my other joints    Currently in Pain? No/denies              [facility] PT Assessment - 05/24/21 0001      6 minute walk test results    Aerobic Endurance Distance Walked 1326  [place] Adult PT Treatment/Exercise - 05/24/21 0001      Neuro Re-ed    Neuro Re-ed Details  tandem walks fwd/bwd 2 x 20 ft each; SLS 30 sec bilateral, tandem eyes closed (caused dizziness), tandem on airex 2 x 30 sec each      Knee/Hip Exercises: Standing   Forward Step Up 10 reps    Forward Step Up Limitations x2; bilateral 2 inch step and airex    Functional Squat 10 reps    Functional Squat Limitations x2; on airex    Other Standing Knee Exercises lateral band walks 2 x 15 ft green band at shins                    PT Short Term Goals - 05/09/21 1034      PT SHORT TERM GOAL #1   Title Patient will be independent with initial HEP.    Baseline patient independent with current HEP    Time 2    Period Weeks    Status Achieved    Target Date 04/19/21      PT SHORT TERM GOAL #2   Title Therapist will  review FOTO and anticipated functional progress.    Baseline reviewed on 4/27    Time 2    Period Weeks    Status Achieved    Target Date 04/19/21      PT SHORT TERM GOAL #3   Title Patient will demonstrate at least 10 degrees of Rt ankle dorsiflexion to improve overall gait mechanics.    Baseline see flowsheet    Time 4    Period Weeks    Status Achieved    Target Date 05/03/21      PT SHORT TERM GOAL #4   Title Patient will demonstrate at least 20 degrees of passive great toe extension to reduce stress about the foot when donning/doffing her shoes.    Baseline see flowsheet    Time 4    Period Weeks    Status Achieved    Target Date 05/03/21             PT Long Term Goals - 05/09/21 1029      PT LONG TERM GOAL #1   Title Patient will walk at least 1400 ft during 6MWT to signify improvements in her overall pace of walking.    Baseline 1275    Time 8    Period Weeks    Status On-going      PT LONG TERM GOAL #2   Title Patient will demonstrate 5/5 hip strength to improve stability about the chain for prolonged walking activity.    Baseline see flowsheet    Time 8    Period Weeks    Status Partially Met      PT LONG TERM GOAL #3   Title Patient will score at least 70% on FOTO to signify clinically meaningful improvement in functional abilities.    Baseline 73%    Time 8    Period Weeks    Status Achieved      PT LONG TERM GOAL #4   Title Patient will be able to maintain SLS 30 sec on right to promote improved balance and ankle control    Baseline 18 sec    Time 8    Period Weeks    Status On-going                 Plan - 05/24/21 1206    Clinical Impression  Statement Patient tolerated session well today without reports of pain focusing on balance training and CKC LE strengthening. She reported dizziness when attempting tandem balance with eyes closed, though was able to perform tandem on unstable surface and SLS without reports of dizziness. Occasional  need for UE support during proprioceptive exercises, though overall her balance is improving. Her 6 MWT distance continues to gradually improve with patient reporting soreness in bilateral hips towards end of test.    PT Treatment/Interventions ADLs/Self Care Home Management;Aquatic Therapy;Cryotherapy;Moist Heat;Gait training;Stair training;Therapeutic activities;Therapeutic exercise;Balance training;Neuromuscular re-education;Patient/family education;Manual techniques;Passive range of motion;Dry needling;Taping;Vasopneumatic Device    PT Next Visit Plan Review HEP and progress PRN, hip strengthening, dynamic balance training    PT Home Exercise Plan Access Code: [email or web address]    Consulted and Agree with Plan of Care Patient           Patient will benefit from skilled therapeutic intervention in order to improve the following deficits and impairments:  Abnormal gait,Decreased range of motion,Difficulty walking,Decreased activity tolerance,Pain,Impaired flexibility,Decreased strength  Visit Diagnosis: S/P foot surgery  Pain in right foot  Difficulty in walking, not elsewhere classified  Stiffness of right foot, not elsewhere classified     Problem List Patient Active Problem List   Diagnosis Date Noted  . History of salpingoophorectomy 05/01/2021  . Atypical lobular hyperplasia [facility]) of right breast 05/01/2021  . Postmenopausal 05/01/2021  . Lipoma of back 12/23/2018  . Chronic kidney disease 09/26/2018  . OA (osteoarthritis) of knee 11/23/2013  . Chronic cough 01/23/2013  . Hypercholesterolemia 11/05/2011   Gwendolyn Grant, PT, DPT, ATC 05/24/21 1:34 PM  [facility] [address] [place], [place], [address] Phone: [phone]   Fax:  [phone]  Name: CHAMPAGNE PALETTA MRN: [id number] Date of Birth: 09-29-1951

## 2021-05-30 ENCOUNTER — Encounter: Payer: Self-pay | Admitting: Physical Therapy

## 2021-05-30 ENCOUNTER — Other Ambulatory Visit: Payer: Self-pay

## 2021-05-30 ENCOUNTER — Ambulatory Visit: Payer: Medicare Other | Admitting: Physical Therapy

## 2021-05-30 DIAGNOSIS — R262 Difficulty in walking, not elsewhere classified: Secondary | ICD-10-CM | POA: Diagnosis not present

## 2021-05-30 DIAGNOSIS — M79671 Pain in right foot: Secondary | ICD-10-CM | POA: Diagnosis not present

## 2021-05-30 DIAGNOSIS — M25674 Stiffness of right foot, not elsewhere classified: Secondary | ICD-10-CM | POA: Diagnosis not present

## 2021-05-30 DIAGNOSIS — Z9889 Other specified postprocedural states: Secondary | ICD-10-CM

## 2021-05-30 NOTE — Patient Instructions (Signed)
Access Code: [email or web address] URL: [email or web address] Date: 05/30/2021 Prepared by: Hilda Blades  Exercises Long Sitting Soleus Stretch on Bolster with Strap - 1 x daily - 7 x weekly - 3 reps - 30 hold Seated Self Great Toe Stretch - 1 x daily - 7 x weekly - 30 hold - 3 reps Towel Scrunches - 1 x daily - 7 x weekly - 1 sets Clamshell with Resistance - 1 x daily - 7 x weekly - 2 sets - 10 reps Sidelying Hip Abduction with Resistance at Thighs - 1 x daily - 7 x weekly - 2 sets - 10 reps Supine Active Straight Leg Raise - 1 x daily - 7 x weekly - 2 sets - 10 reps Bridge with Hip Abduction and Resistance - 1 x daily - 7 x weekly - 2 sets - 10 reps Standing Gastroc Stretch at Counter - 1 x daily - 7 x weekly - 3 reps - 30 hold Standing Hip Flexion March - 1 x daily - 7 x weekly - 2 sets - 10 reps Tandem Stance - 1 x daily - 7 x weekly - 3 sets - 30 sec hold Tandem Walking - 1 x daily - 7 x weekly - 2 sets - 10 reps Single Leg Stance - 1 x daily - 7 x weekly - 3 sets - 30 sec hold Single Leg Heel Raise with Counter Support - 1 x daily - 7 x weekly - 2 sets - 10 reps Standing Hip Extension with Counter Support - 1 x daily - 7 x weekly - 2 sets - 10 reps Squat with Counter Support - 1 x daily - 7 x weekly - 2 sets - 10 reps

## 2021-05-30 NOTE — Therapy (Signed)
[place], [place], [address] Phone: [phone]   Fax:  [phone]  Physical Therapy Treatment / ERO  Patient Details  Name: JOSSALIN CHERVENAK MRN: [id number] Date of Birth: 1951/09/05 Referring Provider (PT): Corky Sing, [place]   Encounter Date: 05/30/2021   PT End of Session - 05/30/21 1006     Visit Number 13    Number of Visits 14    Date for PT Re-Evaluation 06/06/21    Authorization Type MCR    Progress Note Due on Visit 19    PT Start Time 1000    PT Stop Time 7494    PT Time Calculation (min) 43 min    Activity Tolerance Patient tolerated treatment well    Behavior During Therapy [facility] for tasks assessed/performed             Past Medical History:  Diagnosis Date   Arthritis    bilateral knees and hands   Asthma    allergy related, no attacks   Atypical chest pain 02/2014   april stress test=normal   Atypical lobular hyperplasia of breast 09/2002   right   Chronic kidney disease    GFR 58, mild   Edema    maxide prn   Environmental allergies    GERD (gastroesophageal reflux disease)    H/O measles    H/O mumps    Hypercholesterolemia    PONV (postoperative nausea and vomiting)     Past Surgical History:  Procedure Laterality Date   BREAST BIOPSY Right 09/2002   focal atypical lobular hyperplasia   BREAST SURGERY Right 10/03   focal atypia hyperplasia   DILATION AND CURETTAGE OF UTERUS     x2   FOOT SURGERY Right    Bunion   HYSTEROSCOPY WITH D & C  07/2007   polyp removed-benign   LIPOMA EXCISION Right 01/15/2019   Procedure: EXCISION RIGHT BACK LIPOMA;  Surgeon: Wallace Going, DO;  Location: [place];  Service: Plastics;  Laterality: Right;   PATELLA-FEMORAL ARTHROPLASTY Left 11/23/2013   Procedure: LEFT KNEE PATELLA-FEMORAL ARTHROPLASTY;  Surgeon: Gearlean Alf, MD;  Location: [facility];  Service: Orthopedics;  Laterality: Left;   RIGHT  OOPHORECTOMY Right 11/91   tubal ligation with complications   TOTAL KNEE ARTHROPLASTY Left 07/05/2014   Procedure: LEFT TOTAL KNEE ARTHROPLASTY;  Surgeon: Gearlean Alf, MD;  Location: [facility];  Service: Orthopedics;  Laterality: Left;   TOTAL KNEE ARTHROPLASTY Right 11/11/2017   Procedure: RIGHT TOTAL KNEE ARTHROPLASTY;  Surgeon: Gaynelle Arabian, MD;  Location: [facility];  Service: Orthopedics;  Laterality: Right;  with abductor block   TUBAL LIGATION  1991   WISDOM TOOTH EXTRACTION  early 20's    There were no vitals filed for this visit.   Subjective Assessment - 05/30/21 1004     Subjective Patient reports her foot is doing ok, states she knows it will take time until she forgets about it with activity. States her shoulder is bothering her today.    Patient Stated Goals I want to be able to increase the pace of my walk and to not have negative impact on my other joints    Currently in Pain? No/denies                [facility] PT Assessment - 05/30/21 0001       Assessment   Medical Diagnosis Bunion of right foot    Referring Provider (PT) Mechele Claude  Ladean Raya, PA-C    Onset Date/Surgical Date 12/20/20      Precautions   Precautions None      Restrictions   Weight Bearing Restrictions No      Prior Function   Level of Independence Independent      Observation/Other Assessments   Focus on Therapeutic Outcomes (FOTO)  previously achieved at 73% on 04/27/21      Single Leg Stance   Comments Left: 30+ sec, Right: 30+ sec      Strength   Right Hip Flexion 5/5    Right Hip ABduction 4+/5    Left Hip Flexion 5/5    Left Hip ABduction 4+/5      6 minute walk test results    Aerobic Endurance Distance Walked 1412                           [facility] Adult PT Treatment/Exercise - 05/30/21 0001       Self-Care   Self-Care Other Self-Care Comments    Other Self-Care Comments  Exam findings, updated POC to extend for 1 visit to progress hip strength and finalize  HEP      Neuro Re-ed    Neuro Re-ed Details  SLS 3 x 30 sec each      Knee/Hip Exercises: Standing   Heel Raises 2 sets;10 reps    Heel Raises Limitations 1 set DL, 1 set SL    Hip Abduction 2 sets;10 reps    Abduction Limitations green band - patient reported clicking in left hip    Hip Extension 2 sets;10 reps    Extension Limitations green band      Knee/Hip Exercises: Seated   Sit to Sand 2 sets;10 reps      Knee/Hip Exercises: Sidelying   Hip ABduction 2 sets;10 reps    Hip ABduction Limitations yellow band                    PT Education - 05/30/21 1005     Education Details Exam findings, POC udpate for 1 more visit, HEP update to add band to sideling hip abduction    Person(s) Educated Patient    Methods Explanation;Demonstration;Verbal cues    Comprehension Verbalized understanding;Need further instruction;Returned demonstration;Verbal cues required              PT Short Term Goals - 05/09/21 1034       PT SHORT TERM GOAL #1   Title Patient will be independent with initial HEP.    Baseline patient independent with current HEP    Time 2    Period Weeks    Status Achieved    Target Date 04/19/21      PT SHORT TERM GOAL #2   Title Therapist will review FOTO and anticipated functional progress.    Baseline reviewed on 4/27    Time 2    Period Weeks    Status Achieved    Target Date 04/19/21      PT SHORT TERM GOAL #3   Title Patient will demonstrate at least 10 degrees of Rt ankle dorsiflexion to improve overall gait mechanics.    Baseline see flowsheet    Time 4    Period Weeks    Status Achieved    Target Date 05/03/21      PT SHORT TERM GOAL #4   Title Patient will demonstrate at least 20 degrees of passive great toe extension to reduce stress  about the foot when donning/doffing her shoes.    Baseline see flowsheet    Time 4    Period Weeks    Status Achieved    Target Date 05/03/21               PT Long Term Goals -  05/30/21 1018       PT LONG TERM GOAL #1   Title Patient will walk at least 1400 ft during 6MWT to signify improvements in her overall pace of walking.    Baseline 1412 ft - 05/30/21    Time 8    Period Weeks    Status Achieved      PT LONG TERM GOAL #2   Title Patient will demonstrate 5/5 hip strength to improve stability about the chain for prolonged walking activity.    Baseline 4+/5 MMT hip abduction    Time 1    Period Weeks    Status On-going    Target Date 06/06/21      PT LONG TERM GOAL #3   Title Patient will score at least 70% on FOTO to signify clinically meaningful improvement in functional abilities.    Baseline 73% on 04/27/21    Time 8    Period Weeks    Status Achieved      PT LONG TERM GOAL #4   Title Patient will be able to maintain SLS 30 sec on right to promote improved balance and ankle control    Baseline patient exhibits 30+ seconds bilaterally - 05/30/21    Time 8    Period Weeks    Status Achieved                   Plan - 05/30/21 1007     Clinical Impression Statement Patient tolerated therapy well with no adverse effects. Patient is progressing well with her strengthening, walking, and balance. She has achieved all established LTGs except she continues to exhibit a hip strength deficit. Her HEP was progressed to add resistance to hip abduction at home to progress. She would benefit from one more visit to progress hip strength and finalize HEP to maintain her progress from PT.    PT Frequency 1x / week    PT Duration Other (comment)   1 week   PT Treatment/Interventions ADLs/Self Care Home Management;Aquatic Therapy;Cryotherapy;Moist Heat;Gait training;Stair training;Therapeutic activities;Therapeutic exercise;Balance training;Neuromuscular re-education;Patient/family education;Manual techniques;Passive range of motion;Dry needling;Taping;Vasopneumatic Device    PT Next Visit Plan Review HEP and progress PRN, hip strengthening, dynamic balance  training    PT Home Exercise Plan Access Code: [email or web address]    Consulted and Agree with Plan of Care Patient             Patient will benefit from skilled therapeutic intervention in order to improve the following deficits and impairments:  Abnormal gait, Decreased range of motion, Difficulty walking, Decreased activity tolerance, Pain, Impaired flexibility, Decreased strength  Visit Diagnosis: S/P foot surgery  Pain in right foot  Difficulty in walking, not elsewhere classified  Stiffness of right foot, not elsewhere classified     Problem List Patient Active Problem List   Diagnosis Date Noted   History of salpingoophorectomy 05/01/2021   Atypical lobular hyperplasia ([place]) of right breast 05/01/2021   Postmenopausal 05/01/2021   Lipoma of back 12/23/2018   Chronic kidney disease 09/26/2018   OA (osteoarthritis) of knee 11/23/2013   Chronic cough 01/23/2013   Hypercholesterolemia 11/05/2011    Hilda Blades, PT, DPT, LAT, ATC 05/30/21  10:50  AM Phone: [phone] Fax: [place] [facility] [address] [place], [place], [address] Phone: [phone]   Fax:  [phone]  Name: PAISLEY GRAJEDA MRN: [id number] Date of Birth: [birth year]

## 2021-05-31 DIAGNOSIS — Z1211 Encounter for screening for malignant neoplasm of colon: Secondary | ICD-10-CM | POA: Diagnosis not present

## 2021-05-31 DIAGNOSIS — K219 Gastro-esophageal reflux disease without esophagitis: Secondary | ICD-10-CM | POA: Diagnosis not present

## 2021-06-06 ENCOUNTER — Other Ambulatory Visit: Payer: Self-pay

## 2021-06-06 ENCOUNTER — Ambulatory Visit: Payer: Medicare Other

## 2021-06-06 DIAGNOSIS — R262 Difficulty in walking, not elsewhere classified: Secondary | ICD-10-CM

## 2021-06-06 DIAGNOSIS — M25674 Stiffness of right foot, not elsewhere classified: Secondary | ICD-10-CM

## 2021-06-06 DIAGNOSIS — Z9889 Other specified postprocedural states: Secondary | ICD-10-CM

## 2021-06-06 DIAGNOSIS — M79671 Pain in right foot: Secondary | ICD-10-CM

## 2021-06-06 NOTE — Therapy (Signed)
[place], [place], [address] Phone: [phone]   Fax:  [phone]  Physical Therapy Treatment  Patient Details  Name: Diane Fox MRN: [id number] Date of Birth: [birth year] Referring Provider (PT): Corky Sing, [place]   Encounter Date: 06/06/2021   PT End of Session - 06/06/21 1016     Visit Number 14    Number of Visits 14    Date for PT Re-Evaluation 06/06/21    Authorization Type MCR    Progress Note Due on Visit 19    PT Start Time 1016    PT Stop Time 1100    PT Time Calculation (min) 44 min    Activity Tolerance Patient tolerated treatment well    Behavior During Therapy [facility] for tasks assessed/performed             Past Medical History:  Diagnosis Date   Arthritis    bilateral knees and hands   Asthma    allergy related, no attacks   Atypical chest pain 02/2014   april stress test=normal   Atypical lobular hyperplasia of breast 09/2002   right   Chronic kidney disease    GFR 58, mild   Edema    maxide prn   Environmental allergies    GERD (gastroesophageal reflux disease)    H/O measles    H/O mumps    Hypercholesterolemia    PONV (postoperative nausea and vomiting)     Past Surgical History:  Procedure Laterality Date   BREAST BIOPSY Right 09/2002   focal atypical lobular hyperplasia   BREAST SURGERY Right 10/03   focal atypia hyperplasia   DILATION AND CURETTAGE OF UTERUS     x2   FOOT SURGERY Right    Bunion   HYSTEROSCOPY WITH D & C  07/2007   polyp removed-benign   LIPOMA EXCISION Right 01/15/2019   Procedure: EXCISION RIGHT BACK LIPOMA;  Surgeon: Wallace Going, DO;  Location: [place];  Service: Plastics;  Laterality: Right;   PATELLA-FEMORAL ARTHROPLASTY Left 11/23/2013   Procedure: LEFT KNEE PATELLA-FEMORAL ARTHROPLASTY;  Surgeon: Gearlean Alf, MD;  Location: [facility];  Service: Orthopedics;  Laterality: Left;   RIGHT  OOPHORECTOMY Right 11/91   tubal ligation with complications   TOTAL KNEE ARTHROPLASTY Left 07/05/2014   Procedure: LEFT TOTAL KNEE ARTHROPLASTY;  Surgeon: Gearlean Alf, MD;  Location: [facility];  Service: Orthopedics;  Laterality: Left;   TOTAL KNEE ARTHROPLASTY Right 11/11/2017   Procedure: RIGHT TOTAL KNEE ARTHROPLASTY;  Surgeon: Gaynelle Arabian, MD;  Location[facility];  Service: Orthopedics;  Laterality: Right;  with abductor block   TUBAL LIGATION  1991   WISDOM TOOTH EXTRACTION  early 20's    There were no vitals filed for this visit.   Subjective Assessment - 06/06/21 1019     Subjective "Other than my back I am fine. It is fine when I'm walking." Patient reports no issues with her foot.    Patient Stated Goals I want to be able to increase the pace of my walk and to not have negative impact on my other joints    Currently in Pain? Yes    Pain Score 2     Pain Location Back    Pain Orientation Lower    Pain Descriptors / Indicators Tingling    Pain Type Acute pain    Pain Onset 1 to 4 weeks ago    Pain Frequency Intermittent  [facility] PT Assessment - 06/06/21 0001       Observation/Other Assessments   Focus on Therapeutic Outcomes (FOTO)  82%      Single Leg Stance   Comments Left: 30+ sec, Right: 30+ sec      Strength   Right Hip Flexion 5/5    Right Hip Extension 4+/5    Right Hip ABduction 4+/5    Left Hip Flexion 5/5    Left Hip Extension 4+/5    Left Hip ABduction 4+/5                           [facility] Adult PT Treatment/Exercise - 06/06/21 0001       Self-Care   Other Self-Care Comments  see patient education      Knee/Hip Exercises: Standing   Other Standing Knee Exercises deadlift with resistance band 2 x 10      Knee/Hip Exercises: Sidelying   Other Sidelying Knee/Hip Exercises hip flexion/extension taps 2 x 10      Knee/Hip Exercises: Prone   Straight Leg Raises 10 reps    Straight Leg Raises Limitations x2       Ankle Exercises: Standing   SLS 2 x 30 sec each    Heel Raises 10 reps    Heel Raises Limitations x2; SL                    PT Education - 06/06/21 1050     Education Details D/C education, updated HEP. recommendation to f/u with PCP for ongoing LBP.    Person(s) Educated Patient    Methods Explanation;Demonstration;Verbal cues;Handout    Comprehension Verbalized understanding;Returned demonstration;Verbal cues required              PT Short Term Goals - 05/09/21 1034       PT SHORT TERM GOAL #1   Title Patient will be independent with initial HEP.    Baseline patient independent with current HEP    Time 2    Period Weeks    Status Achieved    Target Date 04/19/21      PT SHORT TERM GOAL #2   Title Therapist will review FOTO and anticipated functional progress.    Baseline reviewed on 4/27    Time 2    Period Weeks    Status Achieved    Target Date 04/19/21      PT SHORT TERM GOAL #3   Title Patient will demonstrate at least 10 degrees of Rt ankle dorsiflexion to improve overall gait mechanics.    Baseline see flowsheet    Time 4    Period Weeks    Status Achieved    Target Date 05/03/21      PT SHORT TERM GOAL #4   Title Patient will demonstrate at least 20 degrees of passive great toe extension to reduce stress about the foot when donning/doffing her shoes.    Baseline see flowsheet    Time 4    Period Weeks    Status Achieved    Target Date 05/03/21               PT Long Term Goals - 06/06/21 1051       PT LONG TERM GOAL #1   Title Patient will walk at least 1400 ft during 6MWT to signify improvements in her overall pace of walking.    Baseline 1412 ft - 05/30/21    Time 8  Period Weeks    Status Achieved      PT LONG TERM GOAL #2   Title Patient will demonstrate 5/5 hip strength to improve stability about the chain for prolonged walking activity.    Baseline 4+/5 MMT hip extension and abduction    Time 1    Period Weeks     Status Partially Met      PT LONG TERM GOAL #3   Title Patient will score at least 70% on FOTO to signify clinically meaningful improvement in functional abilities.    Baseline 82%    Time 8    Period Weeks    Status Achieved      PT LONG TERM GOAL #4   Title Patient will be able to maintain SLS 30 sec on right to promote improved balance and ankle control    Baseline patient exhibits 30+ seconds bilaterally - 05/30/21    Time 8    Period Weeks    Status Achieved                   Plan - 06/06/21 1025     Clinical Impression Statement Patient has progressed well since the start of care having met all established functional goals except long term goal regarding hip strength as she has mild weakness remaining in hip abductors and extensors. Her overall foot/ankle mobility and strenght have much improved since start of care and she has improved tolerance to walking and standing activity. She reports no isues with ADLs and walking as part of her exercise routine as it relates to her foot, though continues to report low back pain that began about a month ago, so was encouraged to f/u with her PCP regarding this continued pain. She demonstrates independence with advanced home program and is appropriate for discharge at this time.    PT Frequency --    PT Duration --    PT Treatment/Interventions ADLs/Self Care Home Management;Aquatic Therapy;Cryotherapy;Moist Heat;Gait training;Stair training;Therapeutic activities;Therapeutic exercise;Balance training;Neuromuscular re-education;Patient/family education;Manual techniques;Passive range of motion;Dry needling;Taping;Vasopneumatic Device    PT Next Visit Plan --    PT Home Exercise Plan Access Code: [email or web address]    Recommended Other Services f/u with PCP regarding LBP    Consulted and Agree with Plan of Care Patient             Patient will benefit from skilled therapeutic intervention in order to improve the following deficits and  impairments:  Abnormal gait, Decreased range of motion, Difficulty walking, Decreased activity tolerance, Pain, Impaired flexibility, Decreased strength  Visit Diagnosis: S/P foot surgery  Pain in right foot  Difficulty in walking, not elsewhere classified  Stiffness of right foot, not elsewhere classified     Problem List Patient Active Problem List   Diagnosis Date Noted   History of salpingoophorectomy 05/01/2021   Atypical lobular hyperplasia ([place]) of right breast 05/01/2021   Postmenopausal 05/01/2021   Lipoma of back 12/23/2018   Chronic kidney disease 09/26/2018   OA (osteoarthritis) of knee 11/23/2013   Chronic cough 01/23/2013   Hypercholesterolemia 11/05/2011   PHYSICAL THERAPY DISCHARGE SUMMARY  Visits from Start of Care: 14  Current functional level related to goals / functional outcomes: See above   Remaining deficits: Hip weakness; unrelated LBP    Education / Equipment: See above    Patient agrees to discharge. Patient goals were partially met. Patient is being discharged due to being pleased with the current functional level. Gwendolyn Grant, PT, DPT, ATC 06/06/21 1:48 PM  [place], [place], [address] Phone: [phone]   Fax:  [phone]  Name: Diane Fox MRN: [id number] Date of Birth: Jan 20, 1951

## 2021-06-13 DIAGNOSIS — L988 Other specified disorders of the skin and subcutaneous tissue: Secondary | ICD-10-CM | POA: Diagnosis not present

## 2021-06-13 DIAGNOSIS — D485 Neoplasm of uncertain behavior of skin: Secondary | ICD-10-CM | POA: Diagnosis not present

## 2021-07-03 DIAGNOSIS — D120 Benign neoplasm of cecum: Secondary | ICD-10-CM | POA: Diagnosis not present

## 2021-07-03 DIAGNOSIS — K635 Polyp of colon: Secondary | ICD-10-CM | POA: Diagnosis not present

## 2021-07-03 DIAGNOSIS — D128 Benign neoplasm of rectum: Secondary | ICD-10-CM | POA: Diagnosis not present

## 2021-07-03 DIAGNOSIS — Z1211 Encounter for screening for malignant neoplasm of colon: Secondary | ICD-10-CM | POA: Diagnosis not present

## 2021-07-07 DIAGNOSIS — K635 Polyp of colon: Secondary | ICD-10-CM | POA: Diagnosis not present

## 2021-07-07 DIAGNOSIS — D128 Benign neoplasm of rectum: Secondary | ICD-10-CM | POA: Diagnosis not present

## 2021-07-17 ENCOUNTER — Ambulatory Visit: Payer: Medicare Other

## 2021-07-24 DIAGNOSIS — Z23 Encounter for immunization: Secondary | ICD-10-CM | POA: Diagnosis not present

## 2021-08-14 DIAGNOSIS — L820 Inflamed seborrheic keratosis: Secondary | ICD-10-CM | POA: Diagnosis not present

## 2021-08-28 ENCOUNTER — Ambulatory Visit
Admission: RE | Admit: 2021-08-28 | Discharge: 2021-08-28 | Disposition: A | Discharge status: Home / Self Care | Payer: Medicare Other | Source: Ambulatory Visit | Attending: Family Medicine | Admitting: Family Medicine

## 2021-08-28 ENCOUNTER — Other Ambulatory Visit: Payer: Self-pay

## 2021-08-28 DIAGNOSIS — Z1231 Encounter for screening mammogram for malignant neoplasm of breast: Secondary | ICD-10-CM

## 2021-09-13 DIAGNOSIS — Z23 Encounter for immunization: Secondary | ICD-10-CM | POA: Diagnosis not present

## 2021-10-04 DIAGNOSIS — E782 Mixed hyperlipidemia: Secondary | ICD-10-CM | POA: Diagnosis not present

## 2021-10-04 DIAGNOSIS — I10 Essential (primary) hypertension: Secondary | ICD-10-CM | POA: Diagnosis not present

## 2021-10-04 DIAGNOSIS — I700 Atherosclerosis of aorta: Secondary | ICD-10-CM | POA: Diagnosis not present

## 2021-10-04 DIAGNOSIS — R7303 Prediabetes: Secondary | ICD-10-CM | POA: Diagnosis not present

## 2021-10-09 DIAGNOSIS — H43811 Vitreous degeneration, right eye: Secondary | ICD-10-CM | POA: Diagnosis not present

## 2021-10-09 DIAGNOSIS — H2513 Age-related nuclear cataract, bilateral: Secondary | ICD-10-CM | POA: Diagnosis not present

## 2021-10-09 DIAGNOSIS — H11153 Pinguecula, bilateral: Secondary | ICD-10-CM | POA: Diagnosis not present

## 2021-10-09 DIAGNOSIS — H5213 Myopia, bilateral: Secondary | ICD-10-CM | POA: Diagnosis not present

## 2021-10-09 DIAGNOSIS — H04123 Dry eye syndrome of bilateral lacrimal glands: Secondary | ICD-10-CM | POA: Diagnosis not present

## 2021-10-26 DIAGNOSIS — E782 Mixed hyperlipidemia: Secondary | ICD-10-CM | POA: Diagnosis not present

## 2021-10-26 DIAGNOSIS — E785 Hyperlipidemia, unspecified: Secondary | ICD-10-CM | POA: Diagnosis not present

## 2021-10-26 DIAGNOSIS — I10 Essential (primary) hypertension: Secondary | ICD-10-CM | POA: Diagnosis not present

## 2021-10-26 DIAGNOSIS — K219 Gastro-esophageal reflux disease without esophagitis: Secondary | ICD-10-CM | POA: Diagnosis not present

## 2021-10-26 DIAGNOSIS — N189 Chronic kidney disease, unspecified: Secondary | ICD-10-CM | POA: Diagnosis not present

## 2021-10-26 DIAGNOSIS — M8580 Other specified disorders of bone density and structure, unspecified site: Secondary | ICD-10-CM | POA: Diagnosis not present

## 2021-11-30 NOTE — Progress Notes (Signed)
Diane Fox Date of Birth: [birth year] Medical Record #[id number]  History of Present Illness: Seen today for followup hypercholesterolemia and coronary calcification. She has a history of hypercholesterolemia and is on Crestor therapy.  She had a normal stress Echo in April 2015. 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   In 2019 she complained of DOE. She has a chronic cough with extensive pulmonary evaluation before. Stress Echo in June 2019 was normal. Prior CT chest in 2016 was normal except for some coronary calcification.   On follow up today she is staying  active. She walks daily. She does report over the past month that she has an ache in her heart. No real pain. May last most of the day. Not really associated with anything. Has had some recent stressors.  Current Outpatient Medications on File Prior to Visit  Medication Sig Dispense Refill   aspirin EC 81 MG tablet Take 81 mg by mouth daily.     Calcium Carbonate (CALCIUM 600 PO) Take by mouth.     chlorpheniramine (CHLOR-TRIMETON) 4 MG tablet Take 4 mg by mouth daily as needed for allergies.      cholecalciferol (VITAMIN D) 1000 units tablet Take 1,000 Units by mouth daily.     Coenzyme Q10 (CO Q 10 PO) Take 200 mg by mouth daily.     fexofenadine (ALLEGRA) 180 MG tablet Take 180 mg by mouth daily.     losartan (COZAAR) 25 MG tablet      Melatonin 1 MG CAPS daily as needed.     Multiple Vitamins-Minerals (CENTRUM SILVER PO) Take by mouth.     omeprazole (PRILOSEC) 20 MG capsule Take 20 mg by mouth daily.     rosuvastatin (CRESTOR) 10 MG tablet TAKE 1 TABLET BY MOUTH  DAILY 90 tablet 3   triamcinolone (NASACORT) 55 MCG/ACT AERO nasal inhaler Place 2 sprays into the nose daily.     triamterene-hydrochlorothiazide (MAXZIDE) 75-50 MG per tablet Take 1 tablet by mouth every morning.     vitamin C (ASCORBIC ACID) 500 MG tablet Take 500 mg by mouth daily.     vitamin E 400 UNIT capsule Take 400 Units by mouth daily.     No  current facility-administered medications on file prior to visit.    Allergies  Allergen Reactions   Biaxin [Clarithromycin] Hives   Penicillins Hives    Has patient had a PCN reaction causing immediate rash, facial/tongue/throat swelling, SOB or lightheadedness with hypotension: No Has patient had a PCN reaction causing severe rash involving mucus membranes or skin necrosis: No Has patient had a PCN reaction that required hospitalization: No Has patient had a PCN reaction occurring within the last 10 years: Yes If all of the above answers are "NO", then may proceed with Cephalosporin use.    Sulfa Antibiotics Hives   Zostavax [Zoster Vaccine Live] Hives    Past Medical History:  Diagnosis Date   Arthritis    bilateral knees and hands   Asthma    allergy related, no attacks   Atypical chest pain 02/2014   april stress test=normal   Atypical lobular hyperplasia of breast 09/2002   right   Chronic kidney disease    GFR 58, mild   Edema    maxide prn   Environmental allergies    GERD (gastroesophageal reflux disease)    H/O measles    H/O mumps    Hypercholesterolemia    PONV (postoperative nausea and vomiting)  Past Surgical History:  Procedure Laterality Date   BREAST BIOPSY Right 09/2002   focal atypical lobular hyperplasia   BREAST SURGERY Right 10/03   focal atypia hyperplasia   DILATION AND CURETTAGE OF UTERUS     x2   FOOT SURGERY Right    Bunion   HYSTEROSCOPY WITH D & C  07/2007   polyp removed-benign   LIPOMA EXCISION Right 01/15/2019   Procedure: EXCISION RIGHT BACK LIPOMA;  Surgeon: Wallace Going, DO;  Location: [facility];  Service: Plastics;  Laterality: Right;   PATELLA-FEMORAL ARTHROPLASTY Left 11/23/2013   Procedure: LEFT KNEE PATELLA-FEMORAL ARTHROPLASTY;  Surgeon: Gearlean Alf, MD;  Location: [facility];  Service: Orthopedics;  Laterality: Left;   RIGHT OOPHORECTOMY Right 11/91   tubal ligation with complications   TOTAL  KNEE ARTHROPLASTY Left 07/05/2014   Procedure: LEFT TOTAL KNEE ARTHROPLASTY;  Surgeon: Gearlean Alf, MD;  Location: [facility];  Service: Orthopedics;  Laterality: Left;   TOTAL KNEE ARTHROPLASTY Right 11/11/2017   Procedure: RIGHT TOTAL KNEE ARTHROPLASTY;  Surgeon: Gaynelle Arabian, MD;  Location: [facility];  Service: Orthopedics;  Laterality: Right;  with abductor block   TUBAL LIGATION  1991   WISDOM TOOTH EXTRACTION  early 20's    Social History   Tobacco Use  Smoking Status Never  Smokeless Tobacco Never    Social History   Substance and Sexual Activity  Alcohol Use Yes   Alcohol/week: 1.0 standard drink   Types: 1 Glasses of wine per week   Comment: social    Family History  Problem Relation Age of Onset   Heart disease Father    Heart attack Father    Hypertension Father    Heart disease Mother    Hypertension Mother    Asthma Brother    Cancer Maternal Grandmother        lymphoma   Cancer Maternal Grandfather        prostate cancer    Review of Systems: As noted in history of present illness. Positive for cough.  All other systems were reviewed and are negative.  Physical Exam: BP 130/70 (BP Location: Left Arm)    Pulse 60    Ht 5' 2.5" (1.588 m)    Wt 182 lb 12.8 oz (82.9 kg)    LMP 10/27/2012    SpO2 98%    BMI 32.90 kg/m  GENERAL:  Well appearing WF in NAD HEENT:  PERRL, EOMI, sclera are clear. Oropharynx is clear. NECK:  No jugular venous distention, carotid upstroke brisk and symmetric, no bruits, no thyromegaly or adenopathy LUNGS:  Clear to auscultation bilaterally CHEST:  Unremarkable HEART:  RRR,  PMI not displaced or sustained,S1 and S2 within normal limits, no S3, no S4: no clicks, no rubs, no murmurs ABD:  Soft, nontender. BS +, no masses or bruits. No hepatomegaly, no splenomegaly EXT:  2 + pulses throughout, no edema, no cyanosis no clubbing SKIN:  Warm and dry.  No rashes NEURO:  Alert and oriented x 3. Cranial nerves II through XII intact. PSYCH:   Cognitively intact   LABORATORY DATA:  Lab Results  Component Value Date   WBC 11.6 (H) 11/14/2017   HGB 12.2 11/14/2017   HCT 36.6 11/14/2017   PLT 320 11/14/2017   GLUCOSE 95 01/12/2019   CHOL 158 09/02/2018   TRIG 139 09/02/2018   HDL 59 09/02/2018   LDLCALC 71 09/02/2018   ALT 28 09/02/2018   AST 19 09/02/2018   NA 137  01/12/2019   K 3.9 01/12/2019   CL 101 01/12/2019   CREATININE 0.96 01/12/2019   BUN 19 01/12/2019   CO2 26 01/12/2019   INR 0.94 11/04/2017   Labs dated 05/01/17 reviewed: Normal  Chemistry panel and TSH. Cholesterol- 176, trig- 129, HDL- 61, LDL 89.  Dated 05/14/18: cholesterol 196, triglycerides 158, HDL 65, LDL 100. Chemistries and CBC normal. Dated 06/17/19: normal CBC and LFTs. Normal TSH Dated 08/27/19: cholesterol 165, triglycerides 153, HDL 60, LDL 75. Normal BMET Dated 03/30/20: cholesterol 161, triglycerides 168, HDL 64, LDL 69. CMET normal Dated 10/04/21: cholesterol 176, triglycerides 172, HDL 62, LDL 85. A1c 5.9%. CMET normal. CBC normal   Ecg today shows NSR with rate 60. Normal. I have personally reviewed and interpreted this study.  Stress Echo 05/28/18: Study Conclusions   - Stress ECG conclusions: There were no stress arrhythmias or   conduction abnormalities. The stress ECG was negative for   ischemia. - Staged echo: There was no echocardiographic evidence for   stress-induced ischemia.   Impressions:   - Normal hyperdynamic response to exercise. Normal stress   echocardiogram. Good exrcise capacity. Normal BP response to   exercise.  Assessment / Plan:  1. Hypercholesterolemia. On Crestor 20 mg daily. LDL 85.  Encourage continued  lifestyle modifications.    2. Atypical chest pain of recent onset.   Negative pulmonary evaluation in the past. Normal stress Echo 2019. I have recommended a coronary CTA.   3. HTN. Well controlled on current medication  4. Coronary calcification noted on CT 2016.

## 2021-12-04 ENCOUNTER — Ambulatory Visit (INDEPENDENT_AMBULATORY_CARE_PROVIDER_SITE_OTHER): Payer: Medicare Other | Admitting: Cardiology

## 2021-12-04 ENCOUNTER — Other Ambulatory Visit: Payer: Self-pay

## 2021-12-04 ENCOUNTER — Encounter: Payer: Self-pay | Admitting: Cardiology

## 2021-12-04 VITALS — BP 130/70 | HR 60 | Ht 62.5 in | Wt 182.8 lb

## 2021-12-04 DIAGNOSIS — R079 Chest pain, unspecified: Secondary | ICD-10-CM

## 2021-12-04 DIAGNOSIS — E7800 Pure hypercholesterolemia, unspecified: Secondary | ICD-10-CM

## 2021-12-04 DIAGNOSIS — I2584 Coronary atherosclerosis due to calcified coronary lesion: Secondary | ICD-10-CM

## 2021-12-04 DIAGNOSIS — I2510 Atherosclerotic heart disease of native coronary artery without angina pectoris: Secondary | ICD-10-CM | POA: Diagnosis not present

## 2021-12-04 DIAGNOSIS — R072 Precordial pain: Secondary | ICD-10-CM

## 2021-12-04 DIAGNOSIS — I10 Essential (primary) hypertension: Secondary | ICD-10-CM

## 2021-12-04 LAB — BASIC METABOLIC PANEL
BUN/Creatinine Ratio: 20 (ref 12–28)
BUN: 17 mg/dL (ref 8–27)
CO2: 26 mmol/L (ref 20–29)
Calcium: 10.4 mg/dL — ABNORMAL HIGH (ref 8.7–10.3)
Chloride: 100 mmol/L (ref 96–106)
Creatinine, Ser: 0.83 mg/dL (ref 0.57–1.00)
Glucose: 92 mg/dL (ref 70–99)
Potassium: 4.2 mmol/L (ref 3.5–5.2)
Sodium: 142 mmol/L (ref 134–144)
eGFR: 75 mL/min/{1.73_m2} (ref >59)

## 2021-12-04 MED ORDER — METOPROLOL TARTRATE 50 MG PO TABS: ORAL_TABLET | ORAL | 0 refills | Status: DC

## 2021-12-04 NOTE — Patient Instructions (Addendum)
Medication Instructions:  Continue same medications *If you need a refill on your cardiac medications before your next appointment, please call your pharmacy*   Lab Work: Bmet today   Testing/Procedures: Coronary CT   to be scheduled after approved by insurance    Follow instructions below   Follow-Up: At [facility], you and your health needs are our priority.  As part of our continuing mission to provide you with exceptional heart care, we have created designated Provider Care Teams.  These Care Teams include your primary Cardiologist (physician) and Advanced Practice Providers (APPs -  Physician Assistants and Nurse Practitioners) who all work together to provide you with the care you need, when you need it.  We recommend signing up for the patient portal called "MyChart".  Sign up information is provided on this After Visit Summary.  MyChart is used to connect with patients for Virtual Visits (Telemedicine).  Patients are able to view lab/test results, encounter notes, upcoming appointments, etc.  Non-urgent messages can be sent to your provider as well.   To learn more about what you can do with MyChart, go to [email or web address].      Your next appointment:  To Be Determined after test    The format for your next appointment: Office   Provider:  Dr.Jordan      Your cardiac CT will be scheduled at one of the below locations:   [facility] [address] [place], [place] [address] [phone]  [place] [address] [place], [place] [address] [phone]  If scheduled at [facility], please arrive at the [facility] main entrance (entrance A) of [facility] 30 minutes prior to test start time. You can use the FREE valet parking offered at the main entrance (encouraged to control the heart rate for the test) Proceed to the [facility] Radiology Department (first floor) to  check-in and test prep.  If scheduled at [facility], please arrive 15 mins early for check-in and test prep.  Please follow these instructions carefully (unless otherwise directed):   On the Night Before the Test: Be sure to Drink plenty of water. Do not consume any caffeinated/decaffeinated beverages or chocolate 12 hours prior to your test. Do not take any antihistamines 12 hours prior to your test.   On the Day of the Test: Drink plenty of water until 1 hour prior to the test. Do not eat any food 4 hours prior to the test. You may take your regular medications prior to the test.  Take metoprolol 50 mg two hours prior to test. HOLD Triamterene/Hydrochlorothiazide morning of the test. FEMALES- please wear underwire-free bra if available, avoid dresses & tight clothing       After the Test: Drink plenty of water. After receiving IV contrast, you may experience a mild flushed feeling. This is normal. On occasion, you may experience a mild rash up to 24 hours after the test. This is not dangerous. If this occurs, you can take Benadryl 25 mg and increase your fluid intake. If you experience trouble breathing, this can be serious. If it is severe call 911 IMMEDIATELY. If it is mild, please call our office.   Please allow 2-4 weeks for scheduling of routine cardiac CTs. Some insurance companies require a pre-authorization which may delay scheduling of this test.   For non-scheduling related questions, please contact the cardiac imaging nurse navigator should you have any questions/concerns: Marchia Bond,  Cardiac Imaging Nurse Navigator Gordy Clement, Cardiac Imaging Nurse Navigator [facility] Direct Office Dial: [phone]   For scheduling needs, including cancellations and rescheduling, please call [place], [phone].

## 2021-12-04 NOTE — Addendum Note (Signed)
Addended by: Kathyrn Lass on: 12/04/2021 10:57 AM   Modules accepted: Orders

## 2021-12-07 ENCOUNTER — Telehealth (HOSPITAL_COMMUNITY): Payer: Self-pay | Admitting: *Deleted

## 2021-12-07 NOTE — Telephone Encounter (Signed)
Reaching out to patient to offer assistance regarding upcoming cardiac imaging study; pt verbalizes understanding of appt date/time, parking situation and where to check in, pre-test NPO status and medications ordered, and verified current allergies; name and call back number provided for further questions should they arise  Gordy Clement RN Navigator Cardiac Imaging [facility] [phone] office [phone] cell  Patient to take 50mg  metoprolol tartrate two hours prior to cardiac CT scan. She is aware to arrive at 9:30am for her 10am scan.

## 2021-12-12 ENCOUNTER — Ambulatory Visit (HOSPITAL_COMMUNITY)
Admission: RE | Admit: 2021-12-12 | Discharge: 2021-12-12 | Disposition: A | Discharge status: Home / Self Care | Payer: Medicare Other | Source: Ambulatory Visit | Attending: Cardiology | Admitting: Cardiology

## 2021-12-12 ENCOUNTER — Encounter (HOSPITAL_COMMUNITY): Payer: Self-pay

## 2021-12-12 ENCOUNTER — Other Ambulatory Visit: Payer: Self-pay

## 2021-12-12 DIAGNOSIS — R072 Precordial pain: Secondary | ICD-10-CM | POA: Insufficient documentation

## 2021-12-12 DIAGNOSIS — R079 Chest pain, unspecified: Secondary | ICD-10-CM | POA: Diagnosis not present

## 2021-12-12 DIAGNOSIS — I10 Essential (primary) hypertension: Secondary | ICD-10-CM | POA: Diagnosis not present

## 2021-12-12 DIAGNOSIS — I2510 Atherosclerotic heart disease of native coronary artery without angina pectoris: Secondary | ICD-10-CM | POA: Diagnosis not present

## 2021-12-12 DIAGNOSIS — I2584 Coronary atherosclerosis due to calcified coronary lesion: Secondary | ICD-10-CM | POA: Diagnosis not present

## 2021-12-12 DIAGNOSIS — E7800 Pure hypercholesterolemia, unspecified: Secondary | ICD-10-CM | POA: Diagnosis not present

## 2021-12-12 MED ORDER — NITROGLYCERIN 0.4 MG SL SUBL
SUBLINGUAL_TABLET | SUBLINGUAL | Status: AC
  Administered 2021-12-12: 10:00:00 0.8 mg via SUBLINGUAL
  Filled 2021-12-12: qty 2

## 2021-12-12 MED ORDER — IOHEXOL 350 MG/ML SOLN
95.0000 mL | Freq: Once | INTRAVENOUS | Status: AC | PRN
  Administered 2021-12-12: 10:00:00 95 mL via INTRAVENOUS

## 2021-12-12 MED ORDER — NITROGLYCERIN 0.4 MG SL SUBL: 0.8000 mg | SUBLINGUAL_TABLET | Freq: Once | SUBLINGUAL | Status: AC

## 2021-12-21 ENCOUNTER — Ambulatory Visit (HOSPITAL_COMMUNITY): Payer: Medicare Other

## 2021-12-25 DIAGNOSIS — Z23 Encounter for immunization: Secondary | ICD-10-CM | POA: Diagnosis not present

## 2022-04-11 DIAGNOSIS — I700 Atherosclerosis of aorta: Secondary | ICD-10-CM | POA: Diagnosis not present

## 2022-04-11 DIAGNOSIS — R5383 Other fatigue: Secondary | ICD-10-CM | POA: Diagnosis not present

## 2022-04-11 DIAGNOSIS — Z23 Encounter for immunization: Secondary | ICD-10-CM | POA: Diagnosis not present

## 2022-04-11 DIAGNOSIS — M8580 Other specified disorders of bone density and structure, unspecified site: Secondary | ICD-10-CM | POA: Diagnosis not present

## 2022-04-11 DIAGNOSIS — I10 Essential (primary) hypertension: Secondary | ICD-10-CM | POA: Diagnosis not present

## 2022-04-11 DIAGNOSIS — Z0000 Encounter for general adult medical examination without abnormal findings: Secondary | ICD-10-CM | POA: Diagnosis not present

## 2022-04-11 DIAGNOSIS — E669 Obesity, unspecified: Secondary | ICD-10-CM | POA: Diagnosis not present

## 2022-04-11 DIAGNOSIS — E785 Hyperlipidemia, unspecified: Secondary | ICD-10-CM | POA: Diagnosis not present

## 2022-04-11 DIAGNOSIS — E559 Vitamin D deficiency, unspecified: Secondary | ICD-10-CM | POA: Diagnosis not present

## 2022-04-11 DIAGNOSIS — R7303 Prediabetes: Secondary | ICD-10-CM | POA: Diagnosis not present

## 2022-04-11 DIAGNOSIS — M130 Polyarthritis, unspecified: Secondary | ICD-10-CM | POA: Diagnosis not present

## 2022-04-12 ENCOUNTER — Other Ambulatory Visit: Payer: Self-pay | Admitting: Family Medicine

## 2022-04-12 DIAGNOSIS — M8580 Other specified disorders of bone density and structure, unspecified site: Secondary | ICD-10-CM

## 2022-04-12 DIAGNOSIS — Z1231 Encounter for screening mammogram for malignant neoplasm of breast: Secondary | ICD-10-CM

## 2022-04-13 DIAGNOSIS — M25551 Pain in right hip: Secondary | ICD-10-CM | POA: Diagnosis not present

## 2022-04-13 DIAGNOSIS — M25552 Pain in left hip: Secondary | ICD-10-CM | POA: Diagnosis not present

## 2022-04-13 DIAGNOSIS — M5136 Other intervertebral disc degeneration, lumbar region: Secondary | ICD-10-CM | POA: Diagnosis not present

## 2022-04-13 DIAGNOSIS — Z96651 Presence of right artificial knee joint: Secondary | ICD-10-CM | POA: Diagnosis not present

## 2022-04-20 ENCOUNTER — Other Ambulatory Visit: Payer: Self-pay | Admitting: Cardiology

## 2022-05-01 DIAGNOSIS — L910 Hypertrophic scar: Secondary | ICD-10-CM | POA: Diagnosis not present

## 2022-05-01 DIAGNOSIS — L814 Other melanin hyperpigmentation: Secondary | ICD-10-CM | POA: Diagnosis not present

## 2022-05-01 DIAGNOSIS — L309 Dermatitis, unspecified: Secondary | ICD-10-CM | POA: Diagnosis not present

## 2022-05-08 NOTE — Therapy (Signed)
OUTPATIENT PHYSICAL THERAPY LOWER EXTREMITY EVALUATION   Patient Name: Diane Fox MRN: [id number] DOB:12/07/1951, 71 y.o., female Today's Date: 05/10/2022   PT End of Session - 05/09/22 1531     Visit Number 1    Number of Visits 12    Date for PT Re-Evaluation 06/20/22    Authorization Type Medicare A and B    PT Start Time 1914    PT Stop Time 7829    PT Time Calculation (min) 55 min    Activity Tolerance Patient tolerated treatment well    Behavior During Therapy [facility] for tasks assessed/performed             Past Medical History:  Diagnosis Date   Arthritis    bilateral knees and hands   Asthma    allergy related, no attacks   Atypical chest pain 02/2014   april stress test=normal   Atypical lobular hyperplasia of breast 09/2002   right   Chronic kidney disease    GFR 58, mild   Edema    maxide prn   Environmental allergies    GERD (gastroesophageal reflux disease)    H/O measles    H/O mumps    Hypercholesterolemia    PONV (postoperative nausea and vomiting)    Past Surgical History:  Procedure Laterality Date   BREAST BIOPSY Right 09/2002   focal atypical lobular hyperplasia   BREAST SURGERY Right 10/03   focal atypia hyperplasia   DILATION AND CURETTAGE OF UTERUS     x2   FOOT SURGERY Right    Bunion   HYSTEROSCOPY WITH D & C  07/2007   polyp removed-benign   LIPOMA EXCISION Right 01/15/2019   Procedure: EXCISION RIGHT BACK LIPOMA;  Surgeon: Wallace Going, DO;  Location: [facility];  Service: Plastics;  Laterality: Right;   PATELLA-FEMORAL ARTHROPLASTY Left 11/23/2013   Procedure: LEFT KNEE PATELLA-FEMORAL ARTHROPLASTY;  Surgeon: Gearlean Alf, MD;  Location: [facility];  Service: Orthopedics;  Laterality: Left;   RIGHT OOPHORECTOMY Right 11/91   tubal ligation with complications   TOTAL KNEE ARTHROPLASTY Left 07/05/2014   Procedure: LEFT TOTAL KNEE ARTHROPLASTY;  Surgeon: Gearlean Alf, MD;  [facility];  Service:  Orthopedics;  Laterality: Left;   TOTAL KNEE ARTHROPLASTY Right 11/11/2017   Procedure: RIGHT TOTAL KNEE ARTHROPLASTY;  Surgeon: Gaynelle Arabian, MD;  [facility];  Service: Orthopedics;  Laterality: Right;  with abductor block   TUBAL LIGATION  1991   WISDOM TOOTH EXTRACTION  early 20's   Patient Active Problem List   Diagnosis Date Noted   History of salpingoophorectomy 05/01/2021   Atypical lobular hyperplasia ([place]) of right breast 05/01/2021   Postmenopausal 05/01/2021   Lipoma of back 12/23/2018   Chronic kidney disease 09/26/2018   OA (osteoarthritis) of knee 11/23/2013   Chronic cough 01/23/2013   Hypercholesterolemia 11/05/2011     REFERRING PROVIDER: Gaynelle Arabian, MD   REFERRING DIAG: [phone] (ICD-10-CM) - Pain in left hip   THERAPY DIAG:  Other low back pain  Pain in left hip  Muscle weakness (generalized)  Pain in right hip  Difficulty in walking, not elsewhere classified  Rationale for Evaluation and Treatment Rehabilitation  ONSET DATE: 04/13/2022 MD visit/MD order  SUBJECTIVE:   SUBJECTIVE STATEMENT: Pt saw MD on 04/13/2022 and had x rays of back and hips.  She states x rays of hips looked good and her lumbar x rays showed DDD.  Pt states MD gave her options of injections  or PT and she chose PT.  MD order for PT indicated lumbar DDD.  "Today's a pretty good day, but there are days when I can barely walk".  Pt has pain after standing up from the toilet.  Pt reports having increased pain with gardening.  Pt has disturbed sleep if she rolls over.  Pt has increased pain with ambulation.  Pt states she has good days and bad days.  She occasionally has pain with stairs.     PERTINENT HISTORY: Lumbar DDD, L TKA 2015, R TKA 2018, R foot surgery 12/20/2020 bunionectomy/foot surgery, chronic kidney disease   PAIN:  Are you having pain? Yes NPRS:  Current:  2/10, Worst:  7-8/10, Best:  0/10 Location: central and bilat sides of lumbar. Post hips and  occasionally lateral.  Pt denies any ant hip and groin pain.     PRECAUTIONS: Other: lumbar DDD, bilat TKA  WEIGHT BEARING RESTRICTIONS No  FALLS:  Has patient fallen in last 6 months? No    OCCUPATION: Pt is retired.   PLOF: Independent; Pt was able to perform her daily activities and mobility with less pain.  PATIENT GOALS to be pain free   OBJECTIVE:   DIAGNOSTIC FINDINGS: Pt had x rays at MD office though PT unable to see findings.  Pt reports she was informed that her hip looked good, but x rays showed lumbar DDD.  PATIENT SURVEYS:  FOTO 49 with a goal of 64 at visit #12.  COGNITION:  Overall cognitive status: Within functional limits for tasks assessed      PALPATION: Pt had no tenderness in L lateral Hip   LUMBAR AROM: Flex:  80% Ext:  WNL SB:  R:  WFL, L:  WFL  with pain bilat Rot:  WFL bilat  Pt has tightness in bilat HS.   LOWER EXTREMITY ROM:  Active ROM Right eval Left eval  Hip flexion    Hip extension    Hip abduction [facility]  Hip adduction    Hip internal rotation    Hip external rotation    Knee flexion    Knee extension [facility]  Ankle dorsiflexion    Ankle plantarflexion    Ankle inversion    Ankle eversion     (Blank rows = not tested)  LOWER EXTREMITY MMT:  MMT Right eval Left eval  Hip flexion 5/5 5/5  Hip extension    Hip abduction 4+/5 4/5  Hip adduction    Hip internal rotation    Hip external rotation 5/5 5/5  Knee flexion 5/5 5/5  Knee extension 5/5 5/5  Ankle dorsiflexion    Ankle plantarflexion    Ankle inversion    Ankle eversion     (Blank rows = not tested)  LOWER EXTREMITY SPECIAL TESTS:  Supine SLR test:  negative bilat FABER's test: negative bilat    GAIT: Assistive device utilized: None Level of assistance: Complete Independence Comments: increased stance time on L LE, decreased toe off on R LE, decreased foot clearance on R LE    TODAY'S TREATMENT: Educated pt in correct performance and  palpation of TrA contraction.  Pt performed TrA contractions.  Pt performed supine clams with GTB 2x10 with TrA, supine HS stretch with strap 2x20-30 sec.  Pt received a HEP handout and was educated in correct form and appropriate frequency.  Pt instructed she should not have pain with HEP.   See below for pt education.  PATIENT EDUCATION:  Education details: HEP, POC, dx, objective  findings, and rationale of exercises.  Person educated: Patient Education method: Explanation, Demonstration, Tactile cues, Verbal cues, and Handouts Education comprehension: verbalized understanding, returned demonstration, verbal cues required, tactile cues required, and needs further education   HOME EXERCISE PROGRAM: Access Code: [email or web address] URL: [email or web address] Date: 05/09/2022 Prepared by: Ronny Flurry  Exercises - Supine Transversus Abdominis Bracing - Hands on Stomach  - 2 x daily - 7 x weekly - 2 sets - 10 reps - Hooklying Clamshell with Resistance  - 1 x daily - 4 x weekly - 2 sets - 10 reps - Supine Hamstring Stretch with Strap  - 2 x daily - 7 x weekly - 2 reps - 20-30 seconds hold  ASSESSMENT:  CLINICAL IMPRESSION: Patient is a 71 y.o. female with a dx of L hip pain with MD script stating she has lumbar DDD.  Pt presents with LBP, bilat hip pain, hip abductor weakness, difficulty in walking, and tightness in bilat HS.  Pt has increased pain with functional mobility skills including ambulation, transfers, and occasionally stairs.  Pt has disturbed sleep if she rolls over. She reports having increased pain with gardening.  Pt should benefit from skilled PT services to address impairments and to improve overall function.        OBJECTIVE IMPAIRMENTS Abnormal gait, decreased activity tolerance, decreased endurance, decreased mobility, difficulty walking, decreased ROM, decreased strength, impaired flexibility, and pain.   ACTIVITY LIMITATIONS stairs, transfers, bed mobility, and  locomotion level  PARTICIPATION LIMITATIONS: yard work  PERSONAL FACTORS 1-2 comorbidities: bilat TKA and R foot surgery/bunionectomy  are also affecting patient's functional outcome.   REHAB POTENTIAL: Good  CLINICAL DECISION MAKING: Stable/uncomplicated  EVALUATION COMPLEXITY: Low   GOALS:   SHORT TERM GOALS: Target date: 05/30/2022  Pt will be independent and compliant with HEP for improved pain, strength, and function.  Baseline: Goal status: INITIAL  2.  Pt will report at least a 25% improvement in pain and sx's overall.  Baseline:  Goal status: INITIAL  3.  Pt will report she is able to sleep at least 4/7 nights per week without pain waking her up. Baseline:  Goal status: INITIAL Target date:  06/06/2022   LONG TERM GOALS: Target date: 06/20/2022  Pt will report no pain after performing toilet transfers. Baseline:  Goal status: INITIAL  2.  Pt will demo improved bilat hip abd strength to 5/5 MMT for improved tolerance with functional mobility.  Baseline:  Goal status: INITIAL  3.  Pt will report she is able to perform her normal community ambulation without significant pain or difficulty.  Baseline:  Goal status: INITIAL  4.  Pt will be able to perform gardening without significant pain.  Baseline:  Goal status: INITIAL    PLAN: PT FREQUENCY: 2x/week  PT DURATION: 6 weeks  PLANNED INTERVENTIONS: Therapeutic exercises, Therapeutic activity, Neuromuscular re-education, Balance training, Gait training, Patient/Family education, Joint mobilization, Stair training, Aquatic Therapy, Dry Needling, Electrical stimulation, Spinal mobilization, Cryotherapy, Moist heat, Taping, Ultrasound, Manual therapy, and Re-evaluation  PLAN FOR NEXT SESSION: Review and perform HEP.  Progress core and LE strengthening.  HS stretching.  Assess hip flexor mobility.   Selinda Michaels III PT, DPT 05/10/22 4:37 PM.

## 2022-05-09 ENCOUNTER — Ambulatory Visit (HOSPITAL_BASED_OUTPATIENT_CLINIC_OR_DEPARTMENT_OTHER): Payer: Medicare Other | Attending: Orthopedic Surgery | Admitting: Physical Therapy

## 2022-05-09 ENCOUNTER — Encounter (HOSPITAL_BASED_OUTPATIENT_CLINIC_OR_DEPARTMENT_OTHER): Payer: Self-pay | Admitting: Physical Therapy

## 2022-05-09 DIAGNOSIS — M25552 Pain in left hip: Secondary | ICD-10-CM | POA: Diagnosis not present

## 2022-05-09 DIAGNOSIS — M6281 Muscle weakness (generalized): Secondary | ICD-10-CM | POA: Insufficient documentation

## 2022-05-09 DIAGNOSIS — M5459 Other low back pain: Secondary | ICD-10-CM | POA: Insufficient documentation

## 2022-05-09 DIAGNOSIS — M25551 Pain in right hip: Secondary | ICD-10-CM | POA: Insufficient documentation

## 2022-05-09 DIAGNOSIS — R262 Difficulty in walking, not elsewhere classified: Secondary | ICD-10-CM | POA: Insufficient documentation

## 2022-05-30 IMAGING — CT CT HEART MORP W/ CTA COR W/ SCORE W/ CA W/CM &/OR W/O CM
4 of 7 series · 8 of 20 positions shown, 9 images · IV contrast (APPLIED)
Comparison: Chest CT 09/16/2015.
COMPARISON: Chest CT 09/16/2015.

Addendum:
EXAM:
OVER-READ INTERPRETATION  CT CHEST

The following report is an over-read performed by radiologist Dr.
Quirijn Amazigh [REDACTED] on 12/12/2021. This
over-read does not include interpretation of cardiac or coronary
anatomy or pathology. The coronary calcium score/coronary CTA
interpretation by the cardiologist is attached.
CLINICAL DATA: 70F with coronary calcification, hyperlipidemia, and
chest pain.
Cardiac/Coronary  CT
TECHNIQUE: The patient was scanned on a [employer] Force scanner.

[Series 6: best diast · axial · 0.39mm/px · z∈[+1246,+1279]mm · 2 of 248 slices shown, 3 images]
[im 83/248  vessel]
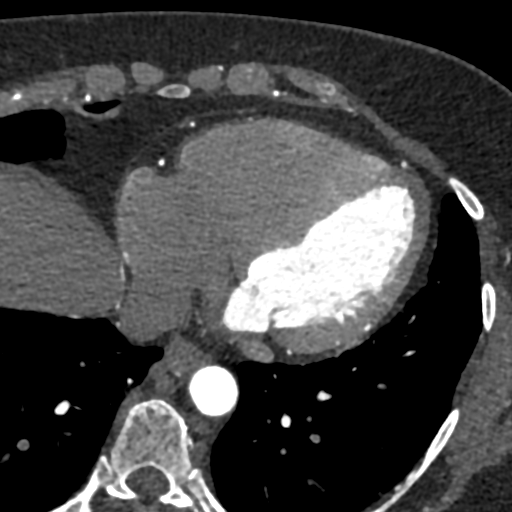
[im 83/248  lung]
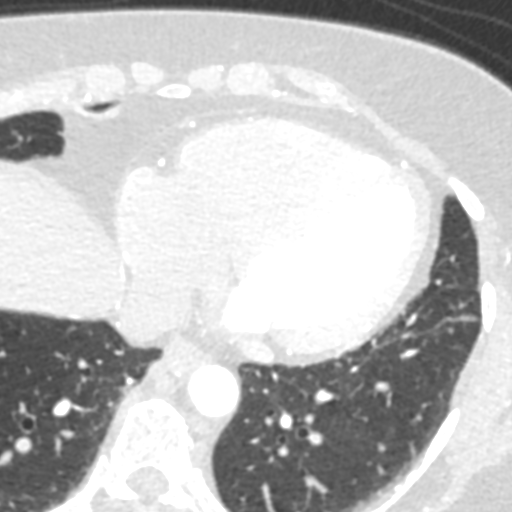
[im 165/248  vessel]
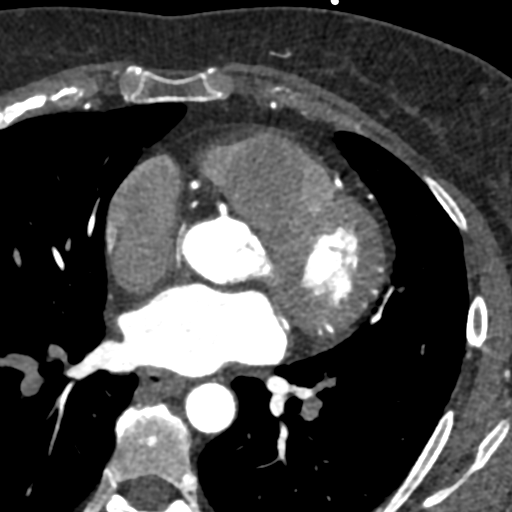

[Series 7: best syst · axial · 0.39mm/px · z∈[+1246,+1279]mm · 2 of 248 slices shown]
[im 83/248  vessel]
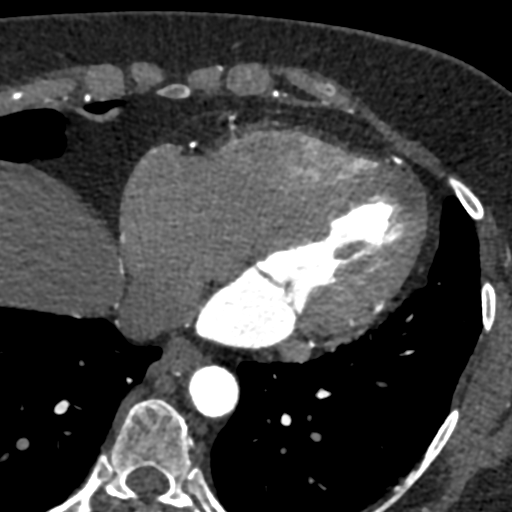
[im 165/248  vessel]
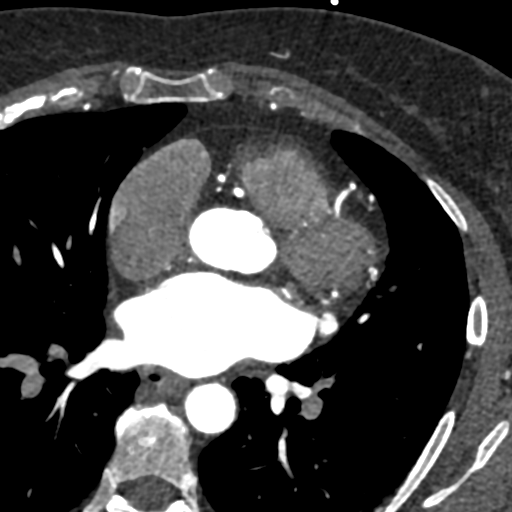

[Series 8: ts diast sharp · axial · 0.39mm/px · z∈[+1246,+1279]mm · 2 of 248 slices shown]
[im 83/248  lung]
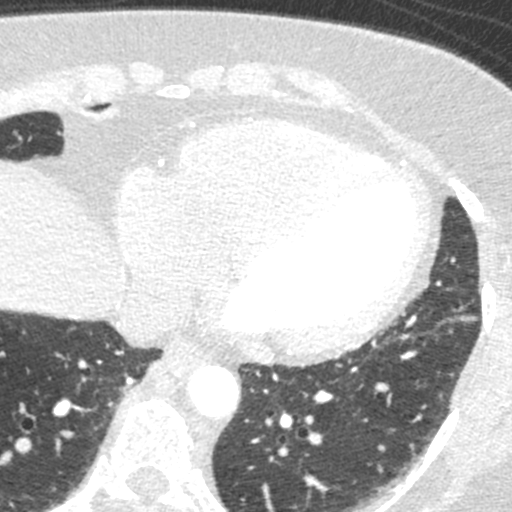
[im 165/248  lung]
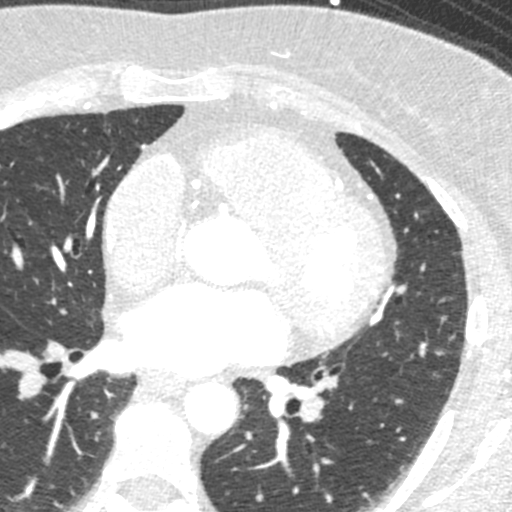

[Series 9: ts syst sharp · axial · 0.39mm/px · z∈[+1246,+1279]mm · 2 of 248 slices shown]
[im 83/248  lung]
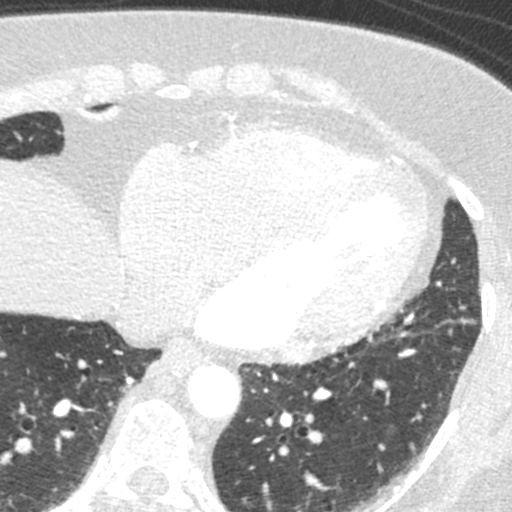
[im 165/248  lung]
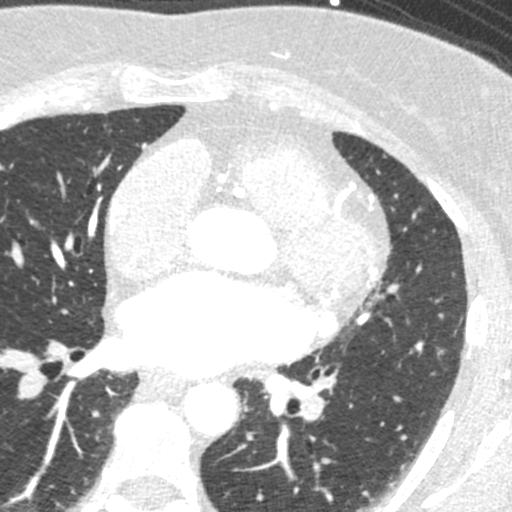

[8 of 20 positions shown; findings below may reference images not displayed]

FINDINGS: Atherosclerotic calcifications in the thoracic aorta. Within the
visualized portions of the thorax there are no suspicious appearing
pulmonary nodules or masses, there is no acute consolidative
airspace disease, no pleural effusions, no pneumothorax and no
lymphadenopathy. Visualized portions of the upper abdomen are
unremarkable. There are no aggressive appearing lytic or blastic
lesions noted in the visualized portions of the skeleton.
IMPRESSION: 1.  Aortic Atherosclerosis (NJCR0-EXF.F).
FINDINGS: A 120 kV prospective scan was triggered in the descending thoracic
aorta at 111 HU's. Axial non-contrast 3 mm slices were carried out
through the heart. The data set was analyzed on a dedicated work
station and scored using the Agatson method. Gantry rotation speed
was 250 msecs and collimation was .6 mm. No beta blockade and 0.8 mg
of sl NTG was given. The 3D data set was reconstructed in 5%
intervals of the 67-82 % of the R-R cycle. Diastolic phases were
analyzed on a dedicated work station using MPR, MIP and VRT modes.
The patient received 80 cc of contrast.

Aorta: Normal size. Mild calcification of the aortic root and
descending aorta. No dissection.

Aortic Valve:  Trileaflet.  No calcifications.

Coronary Arteries:  Normal coronary origin.  Right dominance.

RCA is a large dominant artery that gives rise to PDA and PLVB.
There is no plaque.

Left main is a large artery that gives rise to LAD and LCX arteries.
There is minimal (<25%) calcified plaque.

LAD is a large vessel that has minimal (<25%) calcified plaque
proximally. D1 and D2 have no plaque.

LCX is a non-dominant artery that gives rise to two OM branches.
There is no plaque.

Coronary Calcium Score:

Left main: 0

Left anterior descending artery:

Left circumflex artery: 0

Right coronary artery: 0

Total:

Percentile: 64th

Other findings:

Normal pulmonary vein drainage into the left atrium.

Normal let atrial appendage without a thrombus.

Normal size of the pulmonary artery.
IMPRESSION: 1. Coronary calcium score of 64.4. This was 64th percentile for
age-, race-, and sex-matched controls.

2. Normal coronary origin with right dominance.

3.  Minimal (<25%) LM and LAD calcified plaque.  CAD-RADS 1.

4.  Minimal calcification of the aorta.

*** End of Addendum ***
EXAM:
OVER-READ INTERPRETATION  CT CHEST

The following report is an over-read performed by radiologist Dr.
Quirijn Amazigh [REDACTED] on 12/12/2021. This
over-read does not include interpretation of cardiac or coronary
anatomy or pathology. The coronary calcium score/coronary CTA
interpretation by the cardiologist is attached.
FINDINGS: Atherosclerotic calcifications in the thoracic aorta. Within the
visualized portions of the thorax there are no suspicious appearing
pulmonary nodules or masses, there is no acute consolidative
airspace disease, no pleural effusions, no pneumothorax and no
lymphadenopathy. Visualized portions of the upper abdomen are
unremarkable. There are no aggressive appearing lytic or blastic
lesions noted in the visualized portions of the skeleton.
IMPRESSION: 1.  Aortic Atherosclerosis (NJCR0-EXF.F).

## 2022-06-03 NOTE — Therapy (Signed)
OUTPATIENT PHYSICAL THERAPY TREATMENT NOTE   Patient Name: Diane Fox MRN: [id number] DOB:06/09/1951, 71 y.o., female Today's Date: 06/05/2022    END OF SESSION:   PT End of Session - 06/04/22 1009     Visit Number 2    Number of Visits 12    Date for PT Re-Evaluation 06/20/22    Authorization Type Medicare A and B    PT Start Time 6160    PT Stop Time 1016    PT Time Calculation (min) 35 min    Activity Tolerance Patient tolerated treatment well    Behavior During Therapy [facility] for tasks assessed/performed             Past Medical History:  Diagnosis Date   Arthritis    bilateral knees and hands   Asthma    allergy related, no attacks   Atypical chest pain 02/2014   april stress test=normal   Atypical lobular hyperplasia of breast 09/2002   right   Chronic kidney disease    GFR 58, mild   Edema    maxide prn   Environmental allergies    GERD (gastroesophageal reflux disease)    H/O measles    H/O mumps    Hypercholesterolemia    PONV (postoperative nausea and vomiting)    Past Surgical History:  Procedure Laterality Date   BREAST BIOPSY Right 09/2002   focal atypical lobular hyperplasia   BREAST SURGERY Right 10/03   focal atypia hyperplasia   DILATION AND CURETTAGE OF UTERUS     x2   FOOT SURGERY Right    Bunion   HYSTEROSCOPY WITH D & C  07/2007   polyp removed-benign   LIPOMA EXCISION Right 01/15/2019   Procedure: EXCISION RIGHT BACK LIPOMA;  Surgeon: Wallace Going, DO;  Location: [facility];  Service: Plastics;  Laterality: Right;   PATELLA-FEMORAL ARTHROPLASTY Left 11/23/2013   Procedure: LEFT KNEE PATELLA-FEMORAL ARTHROPLASTY;  Surgeon: Gearlean Alf, MD;  Location: [facility];  Service: Orthopedics;  Laterality: Left;   RIGHT OOPHORECTOMY Right 11/91   tubal ligation with complications   TOTAL KNEE ARTHROPLASTY Left 07/05/2014   Procedure: LEFT TOTAL KNEE ARTHROPLASTY;  Surgeon: Gearlean Alf, MD;  Location: [facility];   Service: Orthopedics;  Laterality: Left;   TOTAL KNEE ARTHROPLASTY Right 11/11/2017   Procedure: RIGHT TOTAL KNEE ARTHROPLASTY;  Surgeon: Gaynelle Arabian, MD;  Location: [facility];  Service: Orthopedics;  Laterality: Right;  with abductor block   TUBAL LIGATION  1991   WISDOM TOOTH EXTRACTION  early 20's   Patient Active Problem List   Diagnosis Date Noted   History of salpingoophorectomy 05/01/2021   Atypical lobular hyperplasia ([place]) of right breast 05/01/2021   Postmenopausal 05/01/2021   Lipoma of back 12/23/2018   Chronic kidney disease 09/26/2018   OA (osteoarthritis) of knee 11/23/2013   Chronic cough 01/23/2013   Hypercholesterolemia 11/05/2011   REFERRING PROVIDER: Gaynelle Arabian, MD    REFERRING DIAG: [phone] (ICD-10-CM) - Pain in left hip    THERAPY DIAG:  Other low back pain   Pain in left hip   Muscle weakness (generalized)   Pain in right hip   Difficulty in walking, not elsewhere classified   Rationale for Evaluation and Treatment Rehabilitation   ONSET DATE: 04/13/2022 MD visit/MD order   SUBJECTIVE:    SUBJECTIVE STATEMENT: Pt saw MD on 04/13/2022 and had x rays of back and hips.  She states x rays of hips looked good and her lumbar  x rays showed DDD.  Pt states MD gave her options of injections or PT and she chose PT.  MD order for PT indicated lumbar DDD.   Pt states she has been out of the country and went on a river cruise.  Pt states she did a lot of walking and her back didn't bother her at all.  Pt reports she did have to take some breaks.  Pt denies any hip or lumbar pain currently.  Pt denies any adverse effects after prior Rx.  Pt reports she hasn't been doing her HEP over the past 2 weeks.   Pt has pain after standing up from the toilet.  Pt reports having increased pain with gardening.  Pt has disturbed sleep if she rolls over.  Pt has increased pain with ambulation.   PERTINENT HISTORY: Lumbar DDD, L TKA 2015, R TKA 2018, R foot surgery 12/20/2020  bunionectomy/foot surgery, chronic kidney disease     PAIN:  Are you having pain? Yes NPRS:  Current:  2/10, Worst:  7-8/10, Best:  0/10 Location: central and bilat sides of lumbar. Post hips and occasionally lateral.  Pt denies any ant hip and groin pain.      PRECAUTIONS: Other: lumbar DDD, bilat TKA    OCCUPATION: Pt is retired.    PLOF: Independent; Pt was able to perform her daily activities and mobility with less pain.   PATIENT GOALS to be pain free     OBJECTIVE:    DIAGNOSTIC FINDINGS: Pt had x rays at MD office though PT unable to see findings.  Pt reports she was informed that her hip looked good, but x rays showed lumbar DDD.      TODAY'S TREATMENT: Therapeutic Exercise: Reviewed pain level, response to prior Rx, and HEP compliance. Reviewed and updated HEP.  Educated pt in correct performance and palpation of TrA contraction.  Pt performed TrA contractions without holds and with 5 sec holds Pt performed: supine clams with GTB 2x10 with TrA Supine marching with TrA 2x10 reps Supine PPT 2x10 reps supine HS stretch with strap 2x20-30 sec.   S/L Hip abduction 2x10 reps bilat   Pt received a HEP handout and was educated in correct form and appropriate frequency.  Pt instructed she should not have pain with HEP.   See below for pt education.   PATIENT EDUCATION:  Education details: HEP, POC, dx, and rationale of exercises.  Person educated: Patient Education method: Explanation, Demonstration, Tactile cues, Verbal cues, and Handouts Education comprehension: verbalized understanding, returned demonstration, verbal cues required, tactile cues required, and needs further education     HOME EXERCISE PROGRAM: Access Code: [email or web address] URL: [email or web address] Date: 05/09/2022 Prepared by: Ronny Flurry   Exercises - Supine Transversus Abdominis Bracing - Hands on Stomach  - 2 x daily - 7 x weekly - 2 sets - 10 reps - Hooklying Clamshell with  Resistance  - 1 x daily - 4 x weekly - 2 sets - 10 reps - Supine Hamstring Stretch with Strap  - 2 x daily - 7 x weekly - 2 reps - 20-30 seconds hold  Updated HEP: - Supine Posterior Pelvic Tilt  - 2 x daily - 7 x weekly - 2 sets - 10 reps - Supine March  - 1-2 x daily - 7 x weekly - 2 sets - 10 reps   ASSESSMENT:   CLINICAL IMPRESSION: Pt has been out of the country on a river cruise.  She did a lot of walking and had  some hip pain though no back pain.  PT reviewed HEP and educated pt in correct form including TrA contraction.  Gently progressed core exercises and pt performed PPT well.  Pt has hip abductor weakness and had more difficulty with S/L hip abduction on R than L.  Pt responded well to Rx reporting no increased pain after Rx.  Pt should benefit from skilled PT services to address impairments and goals and to improve overall function.            OBJECTIVE IMPAIRMENTS Abnormal gait, decreased activity tolerance, decreased endurance, decreased mobility, difficulty walking, decreased ROM, decreased strength, impaired flexibility, and pain.    ACTIVITY LIMITATIONS stairs, transfers, bed mobility, and locomotion level   PARTICIPATION LIMITATIONS: yard work   PERSONAL FACTORS 1-2 comorbidities: bilat TKA and R foot surgery/bunionectomy  are also affecting patient's functional outcome.    REHAB POTENTIAL: Good   CLINICAL DECISION MAKING: Stable/uncomplicated   EVALUATION COMPLEXITY: Low     GOALS:     SHORT TERM GOALS: Target date: 05/30/2022   Pt will be independent and compliant with HEP for improved pain, strength, and function.  Baseline: Goal status: INITIAL   2.  Pt will report at least a 25% improvement in pain and sx's overall.  Baseline:  Goal status: INITIAL   3.  Pt will report she is able to sleep at least 4/7 nights per week without pain waking her up. Baseline:  Goal status: INITIAL Target date:  06/06/2022     LONG TERM GOALS: Target date: 06/20/2022    Pt will report no pain after performing toilet transfers. Baseline:  Goal status: INITIAL   2.  Pt will demo improved bilat hip abd strength to 5/5 MMT for improved tolerance with functional mobility.  Baseline:  Goal status: INITIAL   3.  Pt will report she is able to perform her normal community ambulation without significant pain or difficulty.  Baseline:  Goal status: INITIAL   4.  Pt will be able to perform gardening without significant pain.  Baseline:  Goal status: INITIAL       PLAN: PT FREQUENCY: 2x/week   PT DURATION: 6 weeks   PLANNED INTERVENTIONS: Therapeutic exercises, Therapeutic activity, Neuromuscular re-education, Balance training, Gait training, Patient/Family education, Joint mobilization, Stair training, Aquatic Therapy, Dry Needling, Electrical stimulation, Spinal mobilization, Cryotherapy, Moist heat, Taping, Ultrasound, Manual therapy, and Re-evaluation   PLAN FOR NEXT SESSION: Review and perform HEP.  Progress core and LE strengthening.  HS stretching.  Assess hip flexor mobility   Selinda Michaels III PT, DPT 06/05/22 11:38 AM

## 2022-06-04 ENCOUNTER — Ambulatory Visit (HOSPITAL_BASED_OUTPATIENT_CLINIC_OR_DEPARTMENT_OTHER): Payer: Medicare Other | Attending: Orthopedic Surgery | Admitting: Physical Therapy

## 2022-06-04 DIAGNOSIS — M5459 Other low back pain: Secondary | ICD-10-CM | POA: Insufficient documentation

## 2022-06-04 DIAGNOSIS — M25552 Pain in left hip: Secondary | ICD-10-CM | POA: Diagnosis not present

## 2022-06-04 DIAGNOSIS — M6281 Muscle weakness (generalized): Secondary | ICD-10-CM | POA: Insufficient documentation

## 2022-06-04 DIAGNOSIS — M25551 Pain in right hip: Secondary | ICD-10-CM | POA: Diagnosis not present

## 2022-06-04 DIAGNOSIS — R262 Difficulty in walking, not elsewhere classified: Secondary | ICD-10-CM | POA: Diagnosis not present

## 2022-06-05 ENCOUNTER — Encounter (HOSPITAL_BASED_OUTPATIENT_CLINIC_OR_DEPARTMENT_OTHER): Payer: Self-pay | Admitting: Physical Therapy

## 2022-06-06 ENCOUNTER — Encounter (HOSPITAL_BASED_OUTPATIENT_CLINIC_OR_DEPARTMENT_OTHER): Payer: Self-pay | Admitting: Physical Therapy

## 2022-06-06 ENCOUNTER — Ambulatory Visit (HOSPITAL_BASED_OUTPATIENT_CLINIC_OR_DEPARTMENT_OTHER): Payer: Medicare Other | Admitting: Physical Therapy

## 2022-06-06 DIAGNOSIS — M25551 Pain in right hip: Secondary | ICD-10-CM

## 2022-06-06 DIAGNOSIS — M5459 Other low back pain: Secondary | ICD-10-CM

## 2022-06-06 DIAGNOSIS — R262 Difficulty in walking, not elsewhere classified: Secondary | ICD-10-CM

## 2022-06-06 DIAGNOSIS — M6281 Muscle weakness (generalized): Secondary | ICD-10-CM

## 2022-06-06 DIAGNOSIS — M25552 Pain in left hip: Secondary | ICD-10-CM

## 2022-06-06 NOTE — Therapy (Addendum)
OUTPATIENT PHYSICAL THERAPY TREATMENT NOTE   Patient Name: Diane Fox MRN: [id number] DOB:15-Oct-1951, 71 y.o., female Today's Date: 06/07/2022    END OF SESSION:   PT End of Session - 06/06/22 0937     Visit Number 3    Number of Visits 12    Date for PT Re-Evaluation 06/20/22    Authorization Type Medicare A and B    PT Start Time 0935    PT Stop Time 3329    PT Time Calculation (min) 39 min    Activity Tolerance Patient tolerated treatment well    Behavior During Therapy [facility] for tasks assessed/performed             Past Medical History:  Diagnosis Date   Arthritis    bilateral knees and hands   Asthma    allergy related, no attacks   Atypical chest pain 02/2014   april stress test=normal   Atypical lobular hyperplasia of breast 09/2002   right   Chronic kidney disease    GFR 58, mild   Edema    maxide prn   Environmental allergies    GERD (gastroesophageal reflux disease)    H/O measles    H/O mumps    Hypercholesterolemia    PONV (postoperative nausea and vomiting)    Past Surgical History:  Procedure Laterality Date   BREAST BIOPSY Right 09/2002   focal atypical lobular hyperplasia   BREAST SURGERY Right 10/03   focal atypia hyperplasia   DILATION AND CURETTAGE OF UTERUS     x2   FOOT SURGERY Right    Bunion   HYSTEROSCOPY WITH D & C  07/2007   polyp removed-benign   LIPOMA EXCISION Right 01/15/2019   Procedure: EXCISION RIGHT BACK LIPOMA;  Surgeon: Wallace Going, DO;  Location: [facility];  Service: Plastics;  Laterality: Right;   PATELLA-FEMORAL ARTHROPLASTY Left 11/23/2013   Procedure: LEFT KNEE PATELLA-FEMORAL ARTHROPLASTY;  Surgeon: Gearlean Alf, MD;  Location: [facility];  Service: Orthopedics;  Laterality: Left;   RIGHT OOPHORECTOMY Right 11/91   tubal ligation with complications   TOTAL KNEE ARTHROPLASTY Left 07/05/2014   Procedure: LEFT TOTAL KNEE ARTHROPLASTY;  Surgeon: Gearlean Alf, MD;  Location: [facility];   Service: Orthopedics;  Laterality: Left;   TOTAL KNEE ARTHROPLASTY Right 11/11/2017   Procedure: RIGHT TOTAL KNEE ARTHROPLASTY;  Surgeon: Gaynelle Arabian, MD;  Location: [facility];  Service: Orthopedics;  Laterality: Right;  with abductor block   TUBAL LIGATION  1991   WISDOM TOOTH EXTRACTION  early 20's   Patient Active Problem List   Diagnosis Date Noted   History of salpingoophorectomy 05/01/2021   Atypical lobular hyperplasia ([place]) of right breast 05/01/2021   Postmenopausal 05/01/2021   Lipoma of back 12/23/2018   Chronic kidney disease 09/26/2018   OA (osteoarthritis) of knee 11/23/2013   Chronic cough 01/23/2013   Hypercholesterolemia 11/05/2011   REFERRING PROVIDER: Gaynelle Arabian, MD    REFERRING DIAG: [phone] (ICD-10-CM) - Pain in left hip    THERAPY DIAG:  Other low back pain   Pain in left hip   Muscle weakness (generalized)   Pain in right hip   Difficulty in walking, not elsewhere classified   Rationale for Evaluation and Treatment Rehabilitation   ONSET DATE: 04/13/2022 MD visit/MD order   SUBJECTIVE:    SUBJECTIVE STATEMENT: Pt saw MD on 04/13/2022 and had x rays of back and hips.  She states x rays of hips looked good and her lumbar  x rays showed DDD.  Pt states MD gave her options of injections or PT and she chose PT.  MD order for PT indicated lumbar DDD.    Pt has pain after standing up from the toilet which is worse in the AM.  Pt reports she feels better after taking a hot shower.  Pt reports having increased pain with gardening.  Pt has disturbed sleep if she rolls over.  Pt has increased pain with ambulation.  Pt denies any adverse effects after prior Rx.  Pt states she did a 30 min walk after prior Rx and had some bilat hip pain.  Pt reports she has been performing her HEP since prior Rx.  Pt states she is going to look for a new mattress today.      PERTINENT HISTORY: Lumbar DDD, L TKA 2015, R TKA 2018, R foot surgery 12/20/2020 bunionectomy/foot  surgery, chronic kidney disease     PAIN:  Are you having pain? Yes NPRS:  Current: 0/10, Worst:  7-8/10, Best:  0/10 Location: central and bilat sides of lumbar. Post hips and occasionally lateral.  Pt denies any ant hip and groin pain.      PRECAUTIONS: Other: lumbar DDD, bilat TKA    OCCUPATION: Pt is retired.    PLOF: Independent; Pt was able to perform her daily activities and mobility with less pain.   PATIENT GOALS to be pain free     OBJECTIVE:    DIAGNOSTIC FINDINGS: Pt had x rays at MD office though PT unable to see findings.  Pt reports she was informed that her hip looked good, but x rays showed lumbar DDD.      TODAY'S TREATMENT: Therapeutic Exercise: Reviewed pain level, response to prior Rx, and HEP compliance. Reviewed and updated HEP.  Educated pt in correct performance and palpation of TrA contraction.  Pt performed TrA contractions without holds and with 5 sec holds Pt performed: supine clams with GTB 2x10 with TrA Supine marching with TrA 2x10 reps Supine alt UE/LE aprox 15 reps Supine PPT 2x10 reps supine manual HS stretch with strap 2x30 sec.   S/L Hip abduction 2x10 reps bilat  S/L clams x10 and x5 reps bilat Seated HS stretch 2x20-30 sec Hooklying piriformis stretch 2x30 sec bilat  Thomas Test:  negative bilat  Pt received a HEP handout and was educated in correct form and appropriate frequency.  Pt instructed she should not have pain with HEP.   See below for pt education.   PATIENT EDUCATION:  Education details: HEP, POC, dx, and rationale of exercises.  Person educated: Patient Education method: Explanation, Demonstration, Tactile cues, Verbal cues, and Handouts Education comprehension: verbalized understanding, returned demonstration, verbal cues required, tactile cues required, and needs further education     HOME EXERCISE PROGRAM: Access Code: [email or web address] URL: [email or web address] Date: 05/09/2022 Prepared by: Ronny Flurry   Exercises - Supine Transversus Abdominis Bracing - Hands on Stomach  - 2 x daily - 7 x weekly - 2 sets - 10 reps - Hooklying Clamshell with Resistance  - 1 x daily - 4 x weekly - 2 sets - 10 reps - Supine Hamstring Stretch with Strap  - 2 x daily - 7 x weekly - 2 reps - 20-30 seconds hold - Supine Posterior Pelvic Tilt  - 2 x daily - 7 x weekly - 2 sets - 10 reps - Supine March  - 1-2 x daily - 7 x weekly - 2 sets - 10 reps  Updated HEP: -  Sidelying Hip Abduction  - 1 x daily - 5 x weekly - 2 sets - 10 reps   ASSESSMENT:   CLINICAL IMPRESSION: PT reviewed HEP and updated HEP.  PT educated pt in correct form of exercises including TrA contraction.  Pt performed exercises well with cuing for form.  PT assessed hip flexor tightness and Thomas test was negative bilat.  She has tightness in bilat HS.  Pt responded well to Rx reporting no increased pain after Rx.  Pt should benefit from skilled PT services to address impairments and goals and to improve overall function.            OBJECTIVE IMPAIRMENTS Abnormal gait, decreased activity tolerance, decreased endurance, decreased mobility, difficulty walking, decreased ROM, decreased strength, impaired flexibility, and pain.    ACTIVITY LIMITATIONS stairs, transfers, bed mobility, and locomotion level   PARTICIPATION LIMITATIONS: yard work   PERSONAL FACTORS 1-2 comorbidities: bilat TKA and R foot surgery/bunionectomy  are also affecting patient's functional outcome.    REHAB POTENTIAL: Good   CLINICAL DECISION MAKING: Stable/uncomplicated   EVALUATION COMPLEXITY: Low     GOALS:     SHORT TERM GOALS: Target date: 05/30/2022   Pt will be independent and compliant with HEP for improved pain, strength, and function.  Baseline: Goal status: INITIAL   2.  Pt will report at least a 25% improvement in pain and sx's overall.  Baseline:  Goal status: INITIAL   3.  Pt will report she is able to sleep at least 4/7 nights per  week without pain waking her up. Baseline:  Goal status: INITIAL Target date:  06/06/2022     LONG TERM GOALS: Target date: 06/20/2022   Pt will report no pain after performing toilet transfers. Baseline:  Goal status: INITIAL   2.  Pt will demo improved bilat hip abd strength to 5/5 MMT for improved tolerance with functional mobility.  Baseline:  Goal status: INITIAL   3.  Pt will report she is able to perform her normal community ambulation without significant pain or difficulty.  Baseline:  Goal status: INITIAL   4.  Pt will be able to perform gardening without significant pain.  Baseline:  Goal status: INITIAL       PLAN: PT FREQUENCY: 2x/week   PT DURATION: 6 weeks   PLANNED INTERVENTIONS: Therapeutic exercises, Therapeutic activity, Neuromuscular re-education, Balance training, Gait training, Patient/Family education, Joint mobilization, Stair training, Aquatic Therapy, Dry Needling, Electrical stimulation, Spinal mobilization, Cryotherapy, Moist heat, Taping, Ultrasound, Manual therapy, and Re-evaluation   PLAN FOR NEXT SESSION: Review and perform HEP.  Progress core and LE strengthening.  HS stretching.  Assess tenderness and soft tissue tightness in hip and lumbar next visit.    Selinda Michaels III PT, DPT 06/07/22 6:42 AM

## 2022-06-11 ENCOUNTER — Encounter (HOSPITAL_BASED_OUTPATIENT_CLINIC_OR_DEPARTMENT_OTHER): Payer: Self-pay | Admitting: Physical Therapy

## 2022-06-11 ENCOUNTER — Ambulatory Visit (HOSPITAL_BASED_OUTPATIENT_CLINIC_OR_DEPARTMENT_OTHER): Payer: Medicare Other | Admitting: Physical Therapy

## 2022-06-11 DIAGNOSIS — R262 Difficulty in walking, not elsewhere classified: Secondary | ICD-10-CM

## 2022-06-11 DIAGNOSIS — M25552 Pain in left hip: Secondary | ICD-10-CM

## 2022-06-11 DIAGNOSIS — M5459 Other low back pain: Secondary | ICD-10-CM

## 2022-06-11 DIAGNOSIS — M25551 Pain in right hip: Secondary | ICD-10-CM

## 2022-06-11 DIAGNOSIS — M6281 Muscle weakness (generalized): Secondary | ICD-10-CM | POA: Diagnosis not present

## 2022-06-13 ENCOUNTER — Ambulatory Visit (HOSPITAL_BASED_OUTPATIENT_CLINIC_OR_DEPARTMENT_OTHER): Payer: Medicare Other | Admitting: Physical Therapy

## 2022-06-13 ENCOUNTER — Encounter (HOSPITAL_BASED_OUTPATIENT_CLINIC_OR_DEPARTMENT_OTHER): Payer: Self-pay | Admitting: Physical Therapy

## 2022-06-13 DIAGNOSIS — M6281 Muscle weakness (generalized): Secondary | ICD-10-CM

## 2022-06-13 DIAGNOSIS — M25552 Pain in left hip: Secondary | ICD-10-CM | POA: Diagnosis not present

## 2022-06-13 DIAGNOSIS — R262 Difficulty in walking, not elsewhere classified: Secondary | ICD-10-CM | POA: Diagnosis not present

## 2022-06-13 DIAGNOSIS — M25551 Pain in right hip: Secondary | ICD-10-CM

## 2022-06-13 DIAGNOSIS — M5459 Other low back pain: Secondary | ICD-10-CM

## 2022-06-13 NOTE — Therapy (Incomplete)
OUTPATIENT PHYSICAL THERAPY TREATMENT NOTE   Patient Name: Diane Fox MRN: [id number] DOB:07/28/1951, 71 y.o., female Today's Date: 06/14/2022    END OF SESSION:   PT End of Session - 06/13/22 0942     Visit Number 5    Number of Visits 12    Date for PT Re-Evaluation 06/20/22    Authorization Type Medicare A and B    PT Start Time 0936    PT Stop Time 1018    PT Time Calculation (min) 42 min    Activity Tolerance Patient tolerated treatment well    Behavior During Therapy [facility] for tasks assessed/performed               Past Medical History:  Diagnosis Date   Arthritis    bilateral knees and hands   Asthma    allergy related, no attacks   Atypical chest pain 02/2014   april stress test=normal   Atypical lobular hyperplasia of breast 09/2002   right   Chronic kidney disease    GFR 58, mild   Edema    maxide prn   Environmental allergies    GERD (gastroesophageal reflux disease)    H/O measles    H/O mumps    Hypercholesterolemia    PONV (postoperative nausea and vomiting)    Past Surgical History:  Procedure Laterality Date   BREAST BIOPSY Right 09/2002   focal atypical lobular hyperplasia   BREAST SURGERY Right 10/03   focal atypia hyperplasia   DILATION AND CURETTAGE OF UTERUS     x2   FOOT SURGERY Right    Bunion   HYSTEROSCOPY WITH D & C  07/2007   polyp removed-benign   LIPOMA EXCISION Right 01/15/2019   Procedure: EXCISION RIGHT BACK LIPOMA;  Surgeon: Wallace Going, DO;  Location: [place];  Service: Plastics;  Laterality: Right;   PATELLA-FEMORAL ARTHROPLASTY Left 11/23/2013   Procedure: LEFT KNEE PATELLA-FEMORAL ARTHROPLASTY;  Surgeon: Gearlean Alf, MD;  Location: [facility];  Service: Orthopedics;  Laterality: Left;   RIGHT OOPHORECTOMY Right 11/91   tubal ligation with complications   TOTAL KNEE ARTHROPLASTY Left 07/05/2014   Procedure: LEFT TOTAL KNEE ARTHROPLASTY;  Surgeon: Gearlean Alf, MD;  Location: [facility];   Service: Orthopedics;  Laterality: Left;   TOTAL KNEE ARTHROPLASTY Right 11/11/2017   Procedure: RIGHT TOTAL KNEE ARTHROPLASTY;  Surgeon: Gaynelle Arabian, MD;  Location: [facility];  Service: Orthopedics;  Laterality: Right;  with abductor block   TUBAL LIGATION  1991   WISDOM TOOTH EXTRACTION  early 20's   Patient Active Problem List   Diagnosis Date Noted   History of salpingoophorectomy 05/01/2021   Atypical lobular hyperplasia ([place]) of right breast 05/01/2021   Postmenopausal 05/01/2021   Lipoma of back 12/23/2018   Chronic kidney disease 09/26/2018   OA (osteoarthritis) of knee 11/23/2013   Chronic cough 01/23/2013   Hypercholesterolemia 11/05/2011   REFERRING PROVIDER: Gaynelle Arabian, MD    REFERRING DIAG: [phone] (ICD-10-CM) - Pain in left hip    THERAPY DIAG:  Other low back pain   Pain in left hip   Muscle weakness (generalized)   Pain in right hip   Difficulty in walking, not elsewhere classified   Rationale for Evaluation and Treatment Rehabilitation   ONSET DATE: 04/13/2022 MD visit/MD order   SUBJECTIVE:    SUBJECTIVE STATEMENT: Pt saw MD on 04/13/2022 and had x rays of back and hips.  She states x rays of hips looked good and  her lumbar x rays showed DDD.  Pt states MD gave her options of injections or PT and she chose PT.  MD order for PT indicated lumbar DDD.  Pt states she has increased pain in standing after standing up from the toilet.  This doesn't happen all the time, but mainly in the AM.  Pt reports she could tell she worked out last visit and had minimal pain after Rx though not bad.  Pt went kayaking this weekend without increased pain.  Pt reports compliance with HEP.  Pt states she had increased pain after prior Rx though states it may also have been due to carrying heavy things at [facility].  Pt states it was a little rough this AM, though feeling better now.  Pt states her pain is not as frequent as it used to be.      PERTINENT HISTORY: Lumbar DDD,  L TKA 2015, R TKA 2018, R foot surgery 12/20/2020 bunionectomy/foot surgery, chronic kidney disease     PAIN:  Are you having pain? Not at rest currently NPRS:  Current: 0/10, Worst:  7-8/10, Best:  0/10 Location: central and bilat sides of lumbar. Post hips and occasionally lateral.  Pt denies any ant hip and groin pain.      PRECAUTIONS: Other: lumbar DDD, bilat TKA    OCCUPATION: Pt is retired.    PLOF: Independent; Pt was able to perform her daily activities and mobility with less pain.   PATIENT GOALS to be pain free     OBJECTIVE:    DIAGNOSTIC FINDINGS: Pt had x rays at MD office though PT unable to see findings.  Pt reports she was informed that her hip looked good, but x rays showed lumbar DDD.      TODAY'S TREATMENT: Therapeutic Exercise: Reviewed pain level, response to prior Rx, and HEP compliance. Pt performed: supine SLR x10 with TrA Supine alt UE/LE aprox  2x10 reps Supine PPT 2x10 reps Supine alt LE ext with PPT 2x10 reps supine manual HS stretch 2x30 sec.   S/L Hip abduction 2x10 reps bilat  S/L clams 2x10 reps bilat Seated HS stretch 2x20-30 sec Hooklying piriformis stretch 2x30 sec bilat  Manual Therapy:   Pt received STM to L sided lumbar paraspinals in prone and to L glute in R S/L'ing with pillow b/wn knees to improve soft tissue tightness, myofascial restrictions, and pain.    PATIENT EDUCATION:  Education details: HEP, POC, dx, and rationale of exercises.  Person educated: Patient Education method: Explanation, Demonstration, Tactile cues, Verbal cues, and Handouts Education comprehension: verbalized understanding, returned demonstration, verbal cues required, tactile cues required, and needs further education     HOME EXERCISE PROGRAM: Access Code: [email or web address] URL: [email or web address] Date: 05/09/2022 Prepared by: Ronny Flurry   Exercises - Supine Transversus Abdominis Bracing - Hands on Stomach  - 2 x daily - 7 x weekly - 2  sets - 10 reps - Hooklying Clamshell with Resistance  - 1 x daily - 4 x weekly - 2 sets - 10 reps - Supine Hamstring Stretch with Strap  - 2 x daily - 7 x weekly - 2 reps - 20-30 seconds hold - Supine Posterior Pelvic Tilt  - 2 x daily - 7 x weekly - 2 sets - 10 reps - Supine March  - 1-2 x daily - 7 x weekly - 2 sets - 10 reps - Sidelying Hip Abduction  - 1 x daily - 5 x weekly - 2 sets - 10 reps  Updated  HEP:  Supine Piriformis Stretch with Foot on Ground  - 2 x daily - 7 x weekly - 2-3 reps - 20 -30 seconds hold     ASSESSMENT:   CLINICAL IMPRESSION: Pt is progressing well with sx's and strength.  Pt reports her pain is not as frequent as it was.  Pt performed exercises well with cuing for correct form and positioning.  Pt has tenderness, tightness, and trigger points in L glute.   Pt responded well to Rx reporting no pain after Rx.  Pt should benefit from cont skilled PT services to address impairments and goals and to improve overall function.         OBJECTIVE IMPAIRMENTS Abnormal gait, decreased activity tolerance, decreased endurance, decreased mobility, difficulty walking, decreased ROM, decreased strength, impaired flexibility, and pain.    ACTIVITY LIMITATIONS stairs, transfers, bed mobility, and locomotion level   PARTICIPATION LIMITATIONS: yard work   PERSONAL FACTORS 1-2 comorbidities: bilat TKA and R foot surgery/bunionectomy  are also affecting patient's functional outcome.    REHAB POTENTIAL: Good   CLINICAL DECISION MAKING: Stable/uncomplicated   EVALUATION COMPLEXITY: Low     GOALS:     SHORT TERM GOALS: Target date: 05/30/2022   Pt will be independent and compliant with HEP for improved pain, strength, and function.  Baseline: Goal status: INITIAL   2.  Pt will report at least a 25% improvement in pain and sx's overall.  Baseline:  Goal status: INITIAL   3.  Pt will report she is able to sleep at least 4/7 nights per week without pain waking her  up. Baseline:  Goal status: INITIAL Target date:  06/06/2022     LONG TERM GOALS: Target date: 06/20/2022   Pt will report no pain after performing toilet transfers. Baseline:  Goal status: INITIAL   2.  Pt will demo improved bilat hip abd strength to 5/5 MMT for improved tolerance with functional mobility.  Baseline:  Goal status: INITIAL   3.  Pt will report she is able to perform her normal community ambulation without significant pain or difficulty.  Baseline:  Goal status: INITIAL   4.  Pt will be able to perform gardening without significant pain.  Baseline:  Goal status: INITIAL       PLAN: PT FREQUENCY: 2x/week   PT DURATION: 6 weeks   PLANNED INTERVENTIONS: Therapeutic exercises, Therapeutic activity, Neuromuscular re-education, Balance training, Gait training, Patient/Family education, Joint mobilization, Stair training, Aquatic Therapy, Dry Needling, Electrical stimulation, Spinal mobilization, Cryotherapy, Moist heat, Taping, Ultrasound, Manual therapy, and Re-evaluation   PLAN FOR NEXT SESSION: Review and perform HEP.  Progress core and LE strengthening.  HS stretching.  Cont with STW to lumbar paraspinals and hip.  Selinda Michaels III PT, DPT 06/14/22 11:40 AM

## 2022-06-18 ENCOUNTER — Encounter (HOSPITAL_BASED_OUTPATIENT_CLINIC_OR_DEPARTMENT_OTHER): Payer: Medicare Other | Admitting: Physical Therapy

## 2022-06-20 ENCOUNTER — Ambulatory Visit (HOSPITAL_BASED_OUTPATIENT_CLINIC_OR_DEPARTMENT_OTHER): Payer: Medicare Other | Attending: Orthopedic Surgery | Admitting: Physical Therapy

## 2022-06-20 ENCOUNTER — Encounter (HOSPITAL_BASED_OUTPATIENT_CLINIC_OR_DEPARTMENT_OTHER): Payer: Self-pay | Admitting: Physical Therapy

## 2022-06-20 DIAGNOSIS — M6281 Muscle weakness (generalized): Secondary | ICD-10-CM | POA: Diagnosis not present

## 2022-06-20 DIAGNOSIS — M25551 Pain in right hip: Secondary | ICD-10-CM | POA: Diagnosis not present

## 2022-06-20 DIAGNOSIS — M25552 Pain in left hip: Secondary | ICD-10-CM | POA: Insufficient documentation

## 2022-06-20 DIAGNOSIS — M5459 Other low back pain: Secondary | ICD-10-CM | POA: Insufficient documentation

## 2022-06-20 DIAGNOSIS — R262 Difficulty in walking, not elsewhere classified: Secondary | ICD-10-CM | POA: Insufficient documentation

## 2022-06-20 NOTE — Therapy (Signed)
OUTPATIENT PHYSICAL THERAPY TREATMENT NOTE   Patient Name: Diane Fox MRN: [id number] DOB:1951-12-14, 71 y.o., female Today's Date: 06/20/2022    END OF SESSION:   PT End of Session - 06/20/22 1005     Visit Number 6    Number of Visits 12    Date for PT Re-Evaluation 06/20/22    Authorization Type Medicare A and B    PT Start Time 0865    PT Stop Time 1018    PT Time Calculation (min) 44 min    Activity Tolerance Patient tolerated treatment well    Behavior During Therapy [facility] for tasks assessed/performed                Past Medical History:  Diagnosis Date   Arthritis    bilateral knees and hands   Asthma    allergy related, no attacks   Atypical chest pain 02/2014   april stress test=normal   Atypical lobular hyperplasia of breast 09/2002   right   Chronic kidney disease    GFR 58, mild   Edema    maxide prn   Environmental allergies    GERD (gastroesophageal reflux disease)    H/O measles    H/O mumps    Hypercholesterolemia    PONV (postoperative nausea and vomiting)    Past Surgical History:  Procedure Laterality Date   BREAST BIOPSY Right 09/2002   focal atypical lobular hyperplasia   BREAST SURGERY Right 10/03   focal atypia hyperplasia   DILATION AND CURETTAGE OF UTERUS     x2   FOOT SURGERY Right    Bunion   HYSTEROSCOPY WITH D & C  07/2007   polyp removed-benign   LIPOMA EXCISION Right 01/15/2019   Procedure: EXCISION RIGHT BACK LIPOMA;  Surgeon: Wallace Going, DO;  Location: [place];  Service: Plastics;  Laterality: Right;   PATELLA-FEMORAL ARTHROPLASTY Left 11/23/2013   Procedure: LEFT KNEE PATELLA-FEMORAL ARTHROPLASTY;  Surgeon: Gearlean Alf, MD;  Location: [facility];  Service: Orthopedics;  Laterality: Left;   RIGHT OOPHORECTOMY Right 11/91   tubal ligation with complications   TOTAL KNEE ARTHROPLASTY Left 07/05/2014   Procedure: LEFT TOTAL KNEE ARTHROPLASTY;  Surgeon: Gearlean Alf, MD;  [facility];  Service: Orthopedics;  Laterality: Left;   TOTAL KNEE ARTHROPLASTY Right 11/11/2017   Procedure: RIGHT TOTAL KNEE ARTHROPLASTY;  Surgeon: Gaynelle Arabian, MD;  Location: [facility];  Service: Orthopedics;  Laterality: Right;  with abductor block   TUBAL LIGATION  1991   WISDOM TOOTH EXTRACTION  early 20's   Patient Active Problem List   Diagnosis Date Noted   History of salpingoophorectomy 05/01/2021   Atypical lobular hyperplasia ([place]) of right breast 05/01/2021   Postmenopausal 05/01/2021   Lipoma of back 12/23/2018   Chronic kidney disease 09/26/2018   OA (osteoarthritis) of knee 11/23/2013   Chronic cough 01/23/2013   Hypercholesterolemia 11/05/2011   REFERRING PROVIDER: Gaynelle Arabian, MD    REFERRING DIAG: [phone] (ICD-10-CM) - Pain in left hip    THERAPY DIAG:  Other low back pain   Pain in left hip   Muscle weakness (generalized)   Pain in right hip   Difficulty in walking, not elsewhere classified   Rationale for Evaluation and Treatment Rehabilitation   ONSET DATE: 04/13/2022 MD visit/MD order   SUBJECTIVE:    SUBJECTIVE STATEMENT: Pt saw MD on 04/13/2022 and had x rays of back and hips.  She states x rays of hips looked good  and her lumbar x rays showed DDD.  Pt states MD gave her options of injections or PT and she chose PT.  MD order for PT indicated lumbar DDD.  Pt states "I feel better".  Pt states her pain is not as frequent as it used to be.  Pt reports it's tough in the AM and has discomfort 1st thing in AM.  Pt reports she was sore after prior Rx.  Pt reports she had increased pain with lifting and carrying her grandchild.  Pt has increased pain with vacuuming.  Pt reports 65% improvement in pain and sx's overall.  Pt reports she is able to sleep 7/10 or 4/7 nights/wk without pain waking her up.          PERTINENT HISTORY: Lumbar DDD, L TKA 2015, R TKA 2018, R foot surgery 12/20/2020 bunionectomy/foot surgery, chronic kidney disease     PAIN:  Are you  having pain? Not at rest currently NPRS:  Current: 0/10, Worst:  7-8/10, Best:  0/10 Location: central and bilat sides of lumbar. Post hips and occasionally lateral.  Pt denies any ant hip and groin pain.      PRECAUTIONS: Other: lumbar DDD, bilat TKA    OCCUPATION: Pt is retired.    PLOF: Independent; Pt was able to perform her daily activities and mobility with less pain.   PATIENT GOALS to be pain free     OBJECTIVE:    DIAGNOSTIC FINDINGS: Pt had x rays at MD office though PT unable to see findings.  Pt reports she was informed that her hip looked good, but x rays showed lumbar DDD.      TODAY'S TREATMENT: Therapeutic Exercise: Reviewed pain level, response to prior Rx, and HEP compliance. Pt performed: supine SLR 2x10 with TrA Supine alt UE/LE aprox  2x10 reps Supine alt LE ext with PPT 2x10 reps S/L Hip abduction 2x10 reps bilat  S/L clams 2x10 reps bilat Hooklying piriformis stretch 2x30 sec bilat  Manual Therapy:   Pt received STM to bilat sided lumbar paraspinals in prone and to L glute in R S/L'ing with pillow b/wn knees to improve soft tissue tightness, myofascial restrictions, and pain.  PT also rolled L glute with roller in S/L'ing. PT educated and demonstrated using a tennis ball on wall for self tissue mobilization to L glute.  Pt also performed with ball on wall in the clinic.      PATIENT EDUCATION:  Education details: HEP, POC, dx, and rationale of exercises.  Person educated: Patient Education method: Explanation, Demonstration, Tactile cues, Verbal cues, and Handouts Education comprehension: verbalized understanding, returned demonstration, verbal cues required, tactile cues required, and needs further education     HOME EXERCISE PROGRAM: Access Code: [email or web address] URL: [email or web address] Date: 05/09/2022 Prepared by: Ronny Flurry   Exercises - Supine Transversus Abdominis Bracing - Hands on Stomach  - 2 x daily - 7 x weekly - 2 sets  - 10 reps - Hooklying Clamshell with Resistance  - 1 x daily - 4 x weekly - 2 sets - 10 reps - Supine Hamstring Stretch with Strap  - 2 x daily - 7 x weekly - 2 reps - 20-30 seconds hold - Supine Posterior Pelvic Tilt  - 2 x daily - 7 x weekly - 2 sets - 10 reps - Supine March  - 1-2 x daily - 7 x weekly - 2 sets - 10 reps - Sidelying Hip Abduction  - 1 x daily - 5 x weekly - 2 sets -  10 reps - Supine Piriformis Stretch with Foot on Ground  - 2 x daily - 7 x weekly - 2-3 reps - 20 -30 seconds hold     ASSESSMENT:   CLINICAL IMPRESSION: Pt is progressing well with pain and sx's as evidenced by subjective reports.  Pt has soft tightness in lumbar paraspinals and L glute.  She has trigger points in L glute and tenderness with palpation/STW to trigger points.  She is improving with hip and core strength and performed exercises well with cuing for correct form and positioning.  Pt has met all STG's.  Pt responded well to Rx having no c/o's after Rx.  Pt should benefit from cont skilled PT services to address impairments and goals and to improve overall function.           OBJECTIVE IMPAIRMENTS Abnormal gait, decreased activity tolerance, decreased endurance, decreased mobility, difficulty walking, decreased ROM, decreased strength, impaired flexibility, and pain.    ACTIVITY LIMITATIONS stairs, transfers, bed mobility, and locomotion level   PARTICIPATION LIMITATIONS: yard work   PERSONAL FACTORS 1-2 comorbidities: bilat TKA and R foot surgery/bunionectomy  are also affecting patient's functional outcome.    REHAB POTENTIAL: Good   CLINICAL DECISION MAKING: Stable/uncomplicated   EVALUATION COMPLEXITY: Low     GOALS:     SHORT TERM GOALS: Target date: 05/30/2022   Pt will be independent and compliant with HEP for improved pain, strength, and function.  Baseline: Goal status: GOAL MET    2.  Pt will report at least a 25% improvement in pain and sx's overall.  Baseline:  Goal  status: GOAL MET    3.  Pt will report she is able to sleep at least 4/7 nights per week without pain waking her up. Baseline:  Goal status: GOAL MET Target date:  06/06/2022     LONG TERM GOALS: Target date: 06/20/2022   Pt will report no pain after performing toilet transfers. Baseline:  Goal status: INITIAL   2.  Pt will demo improved bilat hip abd strength to 5/5 MMT for improved tolerance with functional mobility.  Baseline:  Goal status: INITIAL   3.  Pt will report she is able to perform her normal community ambulation without significant pain or difficulty.  Baseline:  Goal status: INITIAL   4.  Pt will be able to perform gardening without significant pain.  Baseline:  Goal status: INITIAL       PLAN: PT FREQUENCY: 2x/week   PT DURATION: 6 weeks   PLANNED INTERVENTIONS: Therapeutic exercises, Therapeutic activity, Neuromuscular re-education, Balance training, Gait training, Patient/Family education, Joint mobilization, Stair training, Aquatic Therapy, Dry Needling, Electrical stimulation, Spinal mobilization, Cryotherapy, Moist heat, Taping, Ultrasound, Manual therapy, and Re-evaluation   PLAN FOR NEXT SESSION: PN next visit.  Progress core and LE strengthening.  HS and piriformis flexilbility.  Cont with STW to lumbar paraspinals and hip.  Review self tissue mobilization to glute with ball on wall.   Selinda Michaels III PT, DPT 06/20/22 6:59 PM

## 2022-06-24 NOTE — Therapy (Signed)
OUTPATIENT PHYSICAL THERAPY TREATMENT NOTE / PROGRESS NOTE  Progress Note Reporting Period 05/09/2022 to 06/25/2022  See note below for Objective Data and Assessment of Progress/Goals.       Patient Name: Diane Fox MRN: [id number] DOB:08-22-1951, 71 y.o., female Today's Date: 06/24/2022    END OF SESSION:        Past Medical History:  Diagnosis Date   Arthritis    bilateral knees and hands   Asthma    allergy related, no attacks   Atypical chest pain 02/2014   april stress test=normal   Atypical lobular hyperplasia of breast 09/2002   right   Chronic kidney disease    GFR 58, mild   Edema    maxide prn   Environmental allergies    GERD (gastroesophageal reflux disease)    H/O measles    H/O mumps    Hypercholesterolemia    PONV (postoperative nausea and vomiting)    Past Surgical History:  Procedure Laterality Date   BREAST BIOPSY Right 09/2002   focal atypical lobular hyperplasia   BREAST SURGERY Right 10/03   focal atypia hyperplasia   DILATION AND CURETTAGE OF UTERUS     x2   FOOT SURGERY Right    Bunion   HYSTEROSCOPY WITH D & C  07/2007   polyp removed-benign   LIPOMA EXCISION Right 01/15/2019   Procedure: EXCISION RIGHT BACK LIPOMA;  Surgeon: Wallace Going, DO;  Location: [place];  Service: Plastics;  Laterality: Right;   PATELLA-FEMORAL ARTHROPLASTY Left 11/23/2013   Procedure: LEFT KNEE PATELLA-FEMORAL ARTHROPLASTY;  Surgeon: Gearlean Alf, MD;  Location: [facility];  Service: Orthopedics;  Laterality: Left;   RIGHT OOPHORECTOMY Right 11/91   tubal ligation with complications   TOTAL KNEE ARTHROPLASTY Left 07/05/2014   Procedure: LEFT TOTAL KNEE ARTHROPLASTY;  Surgeon: Gearlean Alf, MD;  Location: [facility];  Service: Orthopedics;  Laterality: Left;   TOTAL KNEE ARTHROPLASTY Right 11/11/2017   Procedure: RIGHT TOTAL KNEE ARTHROPLASTY;  Surgeon: Gaynelle Arabian, MD;  Location: [facility];  Service: Orthopedics;  Laterality:  Right;  with abductor block   TUBAL LIGATION  1991   WISDOM TOOTH EXTRACTION  early 20's   Patient Active Problem List   Diagnosis Date Noted   History of salpingoophorectomy 05/01/2021   Atypical lobular hyperplasia ([place]) of right breast 05/01/2021   Postmenopausal 05/01/2021   Lipoma of back 12/23/2018   Chronic kidney disease 09/26/2018   OA (osteoarthritis) of knee 11/23/2013   Chronic cough 01/23/2013   Hypercholesterolemia 11/05/2011   REFERRING PROVIDER: Gaynelle Arabian, MD    REFERRING DIAG: [phone] (ICD-10-CM) - Pain in left hip    THERAPY DIAG:  Other low back pain   Pain in left hip   Muscle weakness (generalized)   Pain in right hip   Difficulty in walking, not elsewhere classified   Rationale for Evaluation and Treatment Rehabilitation   ONSET DATE: 04/13/2022 MD visit/MD order   SUBJECTIVE:    SUBJECTIVE STATEMENT: Pt saw MD on 04/13/2022 and had x rays of back and hips.  She states x rays of hips looked good and her lumbar x rays showed DDD.  Pt states MD gave her options of injections or PT and she chose PT.  MD order for PT indicated lumbar DDD.  Pt states "I feel better".  Pt states her pain is not as frequent as it used to be.  Pt reports it's tough in the AM and has discomfort 1st thing in  AM.  Pt reports she was sore after prior Rx.  Pt reports she had increased pain with lifting and carrying her grandchild.  Pt has increased pain with vacuuming.  Pt reports 65% improvement in pain and sx's overall.  Pt reports she is able to sleep 7/10 or 4/7 nights/wk without pain waking her up.          PERTINENT HISTORY: Lumbar DDD, L TKA 2015, R TKA 2018, R foot surgery 12/20/2020 bunionectomy/foot surgery, chronic kidney disease     PAIN:  Are you having pain? Not at rest currently NPRS:  Current: 0/10, Worst:  7-8/10, Best:  0/10 Location: central and bilat sides of lumbar. Post hips and occasionally lateral.  Pt denies any ant hip and groin pain.       PRECAUTIONS: Other: lumbar DDD, bilat TKA    OCCUPATION: Pt is retired.    PLOF: Independent; Pt was able to perform her daily activities and mobility with less pain.   PATIENT GOALS to be pain free     OBJECTIVE:    DIAGNOSTIC FINDINGS: Pt had x rays at MD office though PT unable to see findings.  Pt reports she was informed that her hip looked good, but x rays showed lumbar DDD.      TODAY'S TREATMENT: PATIENT SURVEYS:  FOTO 49 with a goal of 64 at visit #12.   COGNITION:           Overall cognitive status: Within functional limits for tasks assessed                            PALPATION: Pt had no tenderness in L lateral Hip     LUMBAR AROM: Flex:  80% Ext:  WNL SB:  R:  WFL, L:  WFL  with pain bilat Rot:  WFL bilat   Pt has tightness in bilat HS.    LOWER EXTREMITY ROM:   Active ROM Right eval Left eval  Hip flexion      Hip extension      Hip abduction [facility]  Hip adduction      Hip internal rotation      Hip external rotation      Knee flexion      Knee extension [facility]  Ankle dorsiflexion      Ankle plantarflexion      Ankle inversion      Ankle eversion       (Blank rows = not tested)   LOWER EXTREMITY MMT:   MMT Right eval Left eval  Hip flexion 5/5 5/5  Hip extension      Hip abduction 4+/5 4/5  Hip adduction      Hip internal rotation      Hip external rotation 5/5 5/5  Knee flexion 5/5 5/5  Knee extension 5/5 5/5  Ankle dorsiflexion      Ankle plantarflexion      Ankle inversion      Ankle eversion       (Blank rows = not tested)   LOWER EXTREMITY SPECIAL TESTS:  Supine SLR test:  negative bilat FABER's test: negative bilat       GAIT: Assistive device utilized: None Level of assistance: Complete Independence Comments: increased stance time on L LE, decreased toe off on R LE, decreased foot clearance on R LE  Therapeutic Exercise: Reviewed pain level, response to prior Rx, and HEP compliance. Pt performed: supine  SLR 2x10 with TrA  Supine alt UE/LE aprox  2x10 reps Supine alt LE ext with PPT 2x10 reps S/L Hip abduction 2x10 reps bilat  S/L clams 2x10 reps bilat Hooklying piriformis stretch 2x30 sec bilat  Manual Therapy:   Pt received STM to bilat sided lumbar paraspinals in prone and to L glute in R S/L'ing with pillow b/wn knees to improve soft tissue tightness, myofascial restrictions, and pain.  PT also rolled L glute with roller in S/L'ing. PT educated and demonstrated using a tennis ball on wall for self tissue mobilization to L glute.  Pt also performed with ball on wall in the clinic.      PATIENT EDUCATION:  Education details: HEP, POC, dx, and rationale of exercises.  Person educated: Patient Education method: Explanation, Demonstration, Tactile cues, Verbal cues, and Handouts Education comprehension: verbalized understanding, returned demonstration, verbal cues required, tactile cues required, and needs further education     HOME EXERCISE PROGRAM: Access Code: [email or web address] URL: [email or web address] Date: 05/09/2022 Prepared by: Ronny Flurry   Exercises - Supine Transversus Abdominis Bracing - Hands on Stomach  - 2 x daily - 7 x weekly - 2 sets - 10 reps - Hooklying Clamshell with Resistance  - 1 x daily - 4 x weekly - 2 sets - 10 reps - Supine Hamstring Stretch with Strap  - 2 x daily - 7 x weekly - 2 reps - 20-30 seconds hold - Supine Posterior Pelvic Tilt  - 2 x daily - 7 x weekly - 2 sets - 10 reps - Supine March  - 1-2 x daily - 7 x weekly - 2 sets - 10 reps - Sidelying Hip Abduction  - 1 x daily - 5 x weekly - 2 sets - 10 reps - Supine Piriformis Stretch with Foot on Ground  - 2 x daily - 7 x weekly - 2-3 reps - 20 -30 seconds hold     ASSESSMENT:   CLINICAL IMPRESSION: Pt is progressing well with pain and sx's as evidenced by subjective reports.  Pt has soft tightness in lumbar paraspinals and L glute.  She has trigger points in L glute and tenderness with  palpation/STW to trigger points.  She is improving with hip and core strength and performed exercises well with cuing for correct form and positioning.  Pt has met all STG's.  Pt responded well to Rx having no c/o's after Rx.  Pt should benefit from cont skilled PT services to address impairments and goals and to improve overall function.           OBJECTIVE IMPAIRMENTS Abnormal gait, decreased activity tolerance, decreased endurance, decreased mobility, difficulty walking, decreased ROM, decreased strength, impaired flexibility, and pain.    ACTIVITY LIMITATIONS stairs, transfers, bed mobility, and locomotion level   PARTICIPATION LIMITATIONS: yard work   PERSONAL FACTORS 1-2 comorbidities: bilat TKA and R foot surgery/bunionectomy  are also affecting patient's functional outcome.    REHAB POTENTIAL: Good   CLINICAL DECISION MAKING: Stable/uncomplicated   EVALUATION COMPLEXITY: Low     GOALS:     SHORT TERM GOALS: Target date: 05/30/2022   Pt will be independent and compliant with HEP for improved pain, strength, and function.  Baseline: Goal status: GOAL MET    2.  Pt will report at least a 25% improvement in pain and sx's overall.  Baseline:  Goal status: GOAL MET    3.  Pt will report she is able to sleep at least 4/7 nights per week without pain waking her up. Baseline:  Goal status: GOAL MET Target date:  06/06/2022     LONG TERM GOALS: Target date: 06/20/2022   Pt will report no pain after performing toilet transfers. Baseline:  Goal status: INITIAL   2.  Pt will demo improved bilat hip abd strength to 5/5 MMT for improved tolerance with functional mobility.  Baseline:  Goal status: INITIAL   3.  Pt will report she is able to perform her normal community ambulation without significant pain or difficulty.  Baseline:  Goal status: INITIAL   4.  Pt will be able to perform gardening without significant pain.  Baseline:  Goal status: INITIAL       PLAN: PT  FREQUENCY: 2x/week   PT DURATION: 6 weeks   PLANNED INTERVENTIONS: Therapeutic exercises, Therapeutic activity, Neuromuscular re-education, Balance training, Gait training, Patient/Family education, Joint mobilization, Stair training, Aquatic Therapy, Dry Needling, Electrical stimulation, Spinal mobilization, Cryotherapy, Moist heat, Taping, Ultrasound, Manual therapy, and Re-evaluation   PLAN FOR NEXT SESSION: PN next visit.  Progress core and LE strengthening.  HS and piriformis flexilbility.  Cont with STW to lumbar paraspinals and hip.  Review self tissue mobilization to glute with ball on wall.   Selinda Michaels III PT, DPT 06/24/22 8:05 AM

## 2022-06-25 ENCOUNTER — Ambulatory Visit (HOSPITAL_BASED_OUTPATIENT_CLINIC_OR_DEPARTMENT_OTHER): Payer: Medicare Other | Admitting: Physical Therapy

## 2022-06-25 ENCOUNTER — Encounter (HOSPITAL_BASED_OUTPATIENT_CLINIC_OR_DEPARTMENT_OTHER): Payer: Self-pay | Admitting: Physical Therapy

## 2022-06-25 DIAGNOSIS — M5459 Other low back pain: Secondary | ICD-10-CM | POA: Diagnosis not present

## 2022-06-25 DIAGNOSIS — R262 Difficulty in walking, not elsewhere classified: Secondary | ICD-10-CM | POA: Diagnosis not present

## 2022-06-25 DIAGNOSIS — M6281 Muscle weakness (generalized): Secondary | ICD-10-CM | POA: Diagnosis not present

## 2022-06-25 DIAGNOSIS — M25552 Pain in left hip: Secondary | ICD-10-CM | POA: Diagnosis not present

## 2022-06-25 DIAGNOSIS — M25551 Pain in right hip: Secondary | ICD-10-CM | POA: Diagnosis not present

## 2022-06-27 ENCOUNTER — Ambulatory Visit (HOSPITAL_BASED_OUTPATIENT_CLINIC_OR_DEPARTMENT_OTHER): Payer: Medicare Other | Admitting: Physical Therapy

## 2022-06-27 ENCOUNTER — Encounter (HOSPITAL_BASED_OUTPATIENT_CLINIC_OR_DEPARTMENT_OTHER): Payer: Self-pay | Admitting: Physical Therapy

## 2022-06-27 DIAGNOSIS — M25551 Pain in right hip: Secondary | ICD-10-CM | POA: Diagnosis not present

## 2022-06-27 DIAGNOSIS — M5459 Other low back pain: Secondary | ICD-10-CM | POA: Diagnosis not present

## 2022-06-27 DIAGNOSIS — M6281 Muscle weakness (generalized): Secondary | ICD-10-CM | POA: Diagnosis not present

## 2022-06-27 DIAGNOSIS — R262 Difficulty in walking, not elsewhere classified: Secondary | ICD-10-CM | POA: Diagnosis not present

## 2022-06-27 DIAGNOSIS — M25552 Pain in left hip: Secondary | ICD-10-CM | POA: Diagnosis not present

## 2022-06-27 NOTE — Therapy (Signed)
OUTPATIENT PHYSICAL THERAPY TREATMENT NOTE     Patient Name: Diane Fox MRN: [id number] DOB:Jan 30, 1951, 71 y.o., female Today's Date: 06/27/2022    END OF SESSION:   PT End of Session - 06/27/22 0936     Visit Number 8    Number of Visits 12    Date for PT Re-Evaluation 07/23/22    Authorization Type Medicare A and B    PT Start Time 0932    PT Stop Time 6195    PT Time Calculation (min) 42 min    Activity Tolerance Patient tolerated treatment well    Behavior During Therapy [facility] for tasks assessed/performed                 Past Medical History:  Diagnosis Date   Arthritis    bilateral knees and hands   Asthma    allergy related, no attacks   Atypical chest pain 02/2014   april stress test=normal   Atypical lobular hyperplasia of breast 09/2002   right   Chronic kidney disease    GFR 58, mild   Edema    maxide prn   Environmental allergies    GERD (gastroesophageal reflux disease)    H/O measles    H/O mumps    Hypercholesterolemia    PONV (postoperative nausea and vomiting)    Past Surgical History:  Procedure Laterality Date   BREAST BIOPSY Right 09/2002   focal atypical lobular hyperplasia   BREAST SURGERY Right 10/03   focal atypia hyperplasia   DILATION AND CURETTAGE OF UTERUS     x2   FOOT SURGERY Right    Bunion   HYSTEROSCOPY WITH D & C  07/2007   polyp removed-benign   LIPOMA EXCISION Right 01/15/2019   Procedure: EXCISION RIGHT BACK LIPOMA;  Surgeon: Wallace Going, DO;  Location: [place];  Service: Plastics;  Laterality: Right;   PATELLA-FEMORAL ARTHROPLASTY Left 11/23/2013   Procedure: LEFT KNEE PATELLA-FEMORAL ARTHROPLASTY;  Surgeon: Gearlean Alf, MD;  Location: [facility];  Service: Orthopedics;  Laterality: Left;   RIGHT OOPHORECTOMY Right 11/91   tubal ligation with complications   TOTAL KNEE ARTHROPLASTY Left 07/05/2014   Procedure: LEFT TOTAL KNEE ARTHROPLASTY;  Surgeon: Gearlean Alf, MD;  Location:  [facility];  Service: Orthopedics;  Laterality: Left;   TOTAL KNEE ARTHROPLASTY Right 11/11/2017   Procedure: RIGHT TOTAL KNEE ARTHROPLASTY;  Surgeon: Gaynelle Arabian, MD;  Location: [facility];  Service: Orthopedics;  Laterality: Right;  with abductor block   TUBAL LIGATION  1991   WISDOM TOOTH EXTRACTION  early 20's   Patient Active Problem List   Diagnosis Date Noted   History of salpingoophorectomy 05/01/2021   Atypical lobular hyperplasia ([place]) of right breast 05/01/2021   Postmenopausal 05/01/2021   Lipoma of back 12/23/2018   Chronic kidney disease 09/26/2018   OA (osteoarthritis) of knee 11/23/2013   Chronic cough 01/23/2013   Hypercholesterolemia 11/05/2011   REFERRING PROVIDER: Gaynelle Arabian, MD    REFERRING DIAG: [id number] (ICD-10-CM) - Pain in left hip    THERAPY DIAG:  Other low back pain   Pain in left hip   Muscle weakness (generalized)   Pain in right hip   Difficulty in walking, not elsewhere classified   Rationale for Evaluation and Treatment Rehabilitation   ONSET DATE: 04/13/2022 MD visit/MD order   SUBJECTIVE:    SUBJECTIVE STATEMENT: MD order for PT indicated lumbar DDD.  Pt denies any adverse effects after prior Rx.  Pt reports  it's tough in the AM and has discomfort 1st thing in AM.  Pt reports 65% improvement in pain and sx's overall.  HEP COMPLIANCE:  Pt reports compliance with HEP.  Pt used a tennis ball for STW to glute.  FUNCTIONAL IMPROVEMENTS:  pain is not as frequent. Pain doesn't last as long.  Toilet transfers.  ambulation       FUNCTIONAL LIMITATIONS:  ambulation and toilet transfers 1st thing in AM.  vacuuming    PERTINENT HISTORY: Lumbar DDD, L TKA 2015, R TKA 2018, R foot surgery 12/20/2020 bunionectomy/foot surgery, chronic kidney disease     PAIN:  Are you having pain? Not at rest currently NPRS:  Current: 0/10, Worst:  10/10 this past weekend but 6/10 besides the weekend, Best:  0/10 Location: central and bilat sides of lumbar. Post  hips and occasionally lateral.  Pt denies any ant hip and groin pain.      PRECAUTIONS: Other: lumbar DDD, bilat TKA    OCCUPATION: Pt is retired.    PLOF: Independent; Pt was able to perform her daily activities and mobility with less pain.   PATIENT GOALS to be pain free     OBJECTIVE:    DIAGNOSTIC FINDINGS: Pt had x rays at MD office though PT unable to see findings.  Pt reports she was informed that her hip looked good, but x rays showed lumbar DDD.      TODAY'S TREATMENT:  Therapeutic Exercise: Pt performed: supine SLR 2x10 with TrA Supine alt UE/LE aprox 2x10 reps S/L clams 2x10 reps bilat Hooklying piriformis stretch 3x30 sec bilat Standing R Hip abduction with UE support 2x10  Marching on airex with TrA with light support 2x10 Sidestepping at rail x 2 laps Seated HS stretch 2x30 sec bilat    Manual Therapy:   Pt received STM to bilat lumbar paraspinals in prone and to L glute in R S/L'ing with pillow b/wn knees to improve soft tissue tightness, myofascial restrictions, and pain.  PT also rolled L glute with roller in S/L'ing.      PATIENT EDUCATION:  Education details: HEP, POC, dx, and rationale of exercises.  Person educated: Patient Education method: Explanation, Demonstration, Tactile cues, Verbal cues, and Handouts Education comprehension: verbalized understanding, returned demonstration, verbal cues required, tactile cues required, and needs further education     HOME EXERCISE PROGRAM: Access Code: [email or web address] URL: [email or web address] Date: 05/09/2022 Prepared by: Ronny Flurry   Exercises - Supine Transversus Abdominis Bracing - Hands on Stomach  - 2 x daily - 7 x weekly - 2 sets - 10 reps - Hooklying Clamshell with Resistance  - 1 x daily - 4 x weekly - 2 sets - 10 reps - Supine Hamstring Stretch with Strap  - 2 x daily - 7 x weekly - 2 reps - 20-30 seconds hold - Supine Posterior Pelvic Tilt  - 2 x daily - 7 x weekly - 2 sets - 10  reps - Supine March  - 1-2 x daily - 7 x weekly - 2 sets - 10 reps - Sidelying Hip Abduction  - 1 x daily - 5 x weekly - 2 sets - 10 reps - Supine Piriformis Stretch with Foot on Ground  - 2 x daily - 7 x weekly - 2-3 reps - 20 -30 seconds hold Updated HEP - Standing Glute Med Mobilization with Small Ball on Wall  - 1 x daily     ASSESSMENT:   CLINICAL IMPRESSION: Pt is progressing well with core strength, pain,  sx's, and function.  Pt continues to have weakness in bilat glute med.  Pt performed standing hip abduction to improve bilat hip strength though stopped performing with L hip due to L hip clicking.  Pt states it feels uncomfortable, but her hip has done that for years.  PT progressed exercises with adding closed chain exercises today.  Pt performed exercises well with cuing for correct form.  She continues to have trigger points and tenderness in glute.  PT updated HEP with standing glute mobilization with ball on wall.  Pt responded well to Rx having no increased pain after Rx.        OBJECTIVE IMPAIRMENTS Abnormal gait, decreased activity tolerance, decreased endurance, decreased mobility, difficulty walking, decreased ROM, decreased strength, impaired flexibility, and pain.    ACTIVITY LIMITATIONS stairs, transfers, bed mobility, and locomotion level   PARTICIPATION LIMITATIONS: yard work   PERSONAL FACTORS 1-2 comorbidities: bilat TKA and R foot surgery/bunionectomy  are also affecting patient's functional outcome.    REHAB POTENTIAL: Good   CLINICAL DECISION MAKING: Stable/uncomplicated   EVALUATION COMPLEXITY: Low     GOALS:     SHORT TERM GOALS: Target date: 05/30/2022   Pt will be independent and compliant with HEP for improved pain, strength, and function.  Baseline: Goal status: GOAL MET    2.  Pt will report at least a 25% improvement in pain and sx's overall.  Baseline:  Goal status: GOAL MET    3.  Pt will report she is able to sleep at least 4/7 nights  per week without pain waking her up. Baseline:  Goal status: GOAL MET Target date:  06/06/2022     LONG TERM GOALS: Target date: 07/23/2022   Pt will report no pain after performing toilet transfers. Baseline:  Goal status: PROGRESSING   2.  Pt will demo improved bilat hip abd strength to 5/5 MMT for improved tolerance with functional mobility.  Baseline:  Goal status: NOT MET   3.  Pt will report she is able to perform her normal community ambulation without significant pain or difficulty.  Baseline:  Goal status: PROGRESSING   4.  Pt will be able to perform gardening without significant pain.  Baseline:  Goal status: WILL REMOVE GOAL.  Pt states she doesn't garden       PLAN: PT FREQUENCY: 2x/week   PT DURATION: 4 weeks   PLANNED INTERVENTIONS: Therapeutic exercises, Therapeutic activity, Neuromuscular re-education, Balance training, Gait training, Patient/Family education, Joint mobilization, Stair training, Aquatic Therapy, Dry Needling, Electrical stimulation, Spinal mobilization, Cryotherapy, Moist heat, Taping, Ultrasound, Manual therapy, and Re-evaluation   PLAN FOR NEXT SESSION:  Progress core and LE strengthening.  HS and piriformis flexilbility.  Cont with STW to lumbar paraspinals and hip.  Review self tissue mobilization to glute with ball on wall.   Work on Conservation officer, nature.  Selinda Michaels III PT, DPT 06/27/22 9:39 PM

## 2022-07-02 ENCOUNTER — Ambulatory Visit (HOSPITAL_BASED_OUTPATIENT_CLINIC_OR_DEPARTMENT_OTHER): Payer: Medicare Other | Admitting: Physical Therapy

## 2022-07-02 ENCOUNTER — Encounter (HOSPITAL_BASED_OUTPATIENT_CLINIC_OR_DEPARTMENT_OTHER): Payer: Self-pay | Admitting: Physical Therapy

## 2022-07-02 DIAGNOSIS — M6281 Muscle weakness (generalized): Secondary | ICD-10-CM | POA: Diagnosis not present

## 2022-07-02 DIAGNOSIS — M25551 Pain in right hip: Secondary | ICD-10-CM | POA: Diagnosis not present

## 2022-07-02 DIAGNOSIS — R262 Difficulty in walking, not elsewhere classified: Secondary | ICD-10-CM | POA: Diagnosis not present

## 2022-07-02 DIAGNOSIS — M5459 Other low back pain: Secondary | ICD-10-CM | POA: Diagnosis not present

## 2022-07-02 DIAGNOSIS — M25552 Pain in left hip: Secondary | ICD-10-CM | POA: Diagnosis not present

## 2022-07-02 NOTE — Therapy (Signed)
OUTPATIENT PHYSICAL THERAPY TREATMENT NOTE     Patient Name: Diane Fox MRN: [id number] DOB:[birth year], 71 y.o., female Today's Date: 07/02/2022  Visit Number:                          9 Number of visits:                     12 Date for PT Re-Evaluation:     07/23/2022 Authorization Type:                 Medicare A and B PT start time:                           1103 PT stop time:                           1143 PT Time Calculation:                40 mins Activity Tolerance:                     Pt tolerated treatment well Behavior During Therapy:       [facility] for tasks assessed/performed    Past Medical History:  Diagnosis Date   Arthritis    bilateral knees and hands   Asthma    allergy related, no attacks   Atypical chest pain 02/2014   april stress test=normal   Atypical lobular hyperplasia of breast 09/2002   right   Chronic kidney disease    GFR 58, mild   Edema    maxide prn   Environmental allergies    GERD (gastroesophageal reflux disease)    H/O measles    H/O mumps    Hypercholesterolemia    PONV (postoperative nausea and vomiting)    Past Surgical History:  Procedure Laterality Date   BREAST BIOPSY Right 09/2002   focal atypical lobular hyperplasia   BREAST SURGERY Right 10/03   focal atypia hyperplasia   DILATION AND CURETTAGE OF UTERUS     x2   FOOT SURGERY Right    Bunion   HYSTEROSCOPY WITH D & C  07/2007   polyp removed-benign   LIPOMA EXCISION Right 01/15/2019   Procedure: EXCISION RIGHT BACK LIPOMA;  Surgeon: Wallace Going, DO;  Location: [place];  Service: Plastics;  Laterality: Right;   PATELLA-FEMORAL ARTHROPLASTY Left 11/23/2013   Procedure: LEFT KNEE PATELLA-FEMORAL ARTHROPLASTY;  Surgeon: Gearlean Alf, MD;  Location: [facility];  Service: Orthopedics;  Laterality: Left;   RIGHT OOPHORECTOMY Right 11/91   tubal ligation with complications   TOTAL KNEE ARTHROPLASTY Left 07/05/2014   Procedure: LEFT TOTAL KNEE  ARTHROPLASTY;  Surgeon: Gearlean Alf, MD;  Location: [facility];  Service: Orthopedics;  Laterality: Left;   TOTAL KNEE ARTHROPLASTY Right 11/11/2017   Procedure: RIGHT TOTAL KNEE ARTHROPLASTY;  Surgeon: Gaynelle Arabian, MD;  Location: [facility];  Service: Orthopedics;  Laterality: Right;  with abductor block   TUBAL LIGATION  1991   WISDOM TOOTH EXTRACTION  early 20's   Patient Active Problem List   Diagnosis Date Noted   History of salpingoophorectomy 05/01/2021   Atypical lobular hyperplasia (ALH) of right breast 05/01/2021   Postmenopausal 05/01/2021   Lipoma of back 12/23/2018   Chronic kidney disease 09/26/2018   OA (osteoarthritis) of knee 11/23/2013   Chronic cough  01/23/2013   Hypercholesterolemia 11/05/2011   REFERRING PROVIDER: Gaynelle Arabian, MD    REFERRING DIAG: [phone] (ICD-10-CM) - Pain in left hip    THERAPY DIAG:  Other low back pain   Pain in left hip   Muscle weakness (generalized)   Pain in right hip   Difficulty in walking, not elsewhere classified   Rationale for Evaluation and Treatment Rehabilitation   ONSET DATE: 04/13/2022 MD visit/MD order   SUBJECTIVE:    SUBJECTIVE STATEMENT: Pt reports her back and L glute have improved since last session; her R glute is currently giving her more pain. It is worse in the morning and takes a bit of time to warmup before improving. She has been using a tennis ball for self-STM of lumbar and L glute that provides relief.    PERTINENT HISTORY: Lumbar DDD, L TKA 2015, R TKA 2018, R foot surgery 12/20/2020 bunionectomy/foot surgery, chronic kidney disease     PAIN:  Are you having pain? Not at rest currently NPRS:  Current: 0/10, Worst:  10/10 this past weekend but 6/10 besides the weekend, Best:  0/10 Location: central and bilat sides of lumbar. Post hips and occasionally lateral.  Pt denies any ant hip and groin pain.      PRECAUTIONS: Other: lumbar DDD, bilat TKA    OCCUPATION: Pt is retired.    PLOF:  Independent; Pt was able to perform her daily activities and mobility with less pain.   PATIENT GOALS to be pain free     OBJECTIVE:    DIAGNOSTIC FINDINGS: Pt had x rays at MD office though PT unable to see findings.  Pt reports she was informed that her hip looked good, but x rays showed lumbar DDD.      TODAY'S TREATMENT:  Manual - STM to R glute max/med in S/L w/pillow between knees  Therapeutic Exercise: Pt performed:  S/L hip abd 2x10 reps bilat Supine SLR 2x10 with TrA activation Supine clamshells 2x10 green band Bridging with TrA activation cueing, 2x10 Sidestepping x4 laps Cone taps 2x10 Step-ups, 2x8 to 4inch step    PATIENT EDUCATION:  Education details: HEP, POC, dx, and rationale of exercises.  Person educated: Patient Education method: Explanation, Demonstration, Tactile cues, Verbal cues, and Handouts Education comprehension: verbalized understanding, returned demonstration, verbal cues required, tactile cues required, and needs further education     HOME EXERCISE PROGRAM: Access Code: [email or web address] URL: [email or web address] Date: 05/09/2022 Prepared by: Ronny Flurry   Exercises - Supine Transversus Abdominis Bracing - Hands on Stomach  - 2 x daily - 7 x weekly - 2 sets - 10 reps - Hooklying Clamshell with Resistance  - 1 x daily - 4 x weekly - 2 sets - 10 reps - Supine Hamstring Stretch with Strap  - 2 x daily - 7 x weekly - 2 reps - 20-30 seconds hold - Supine Posterior Pelvic Tilt  - 2 x daily - 7 x weekly - 2 sets - 10 reps - Supine March  - 1-2 x daily - 7 x weekly - 2 sets - 10 reps - Sidelying Hip Abduction  - 1 x daily - 5 x weekly - 2 sets - 10 reps - Supine Piriformis Stretch with Foot on Ground  - 2 x daily - 7 x weekly - 2-3 reps - 20 -30 seconds hold Updated HEP - Standing Glute Med Mobilization with Small Ball on Wall  - 1 x daily     ASSESSMENT:   CLINICAL IMPRESSION: Pt is making  fair progress overall. She reports 6/10  pain at beginning of session and tolerates exercises today with no increase in pain. She shows good core activation with bridging and glute med stability upon cues during cone tap/stepping exercises. She mentions some concerns of her R hip clicking/popping without pain during sidestepping though states her hip has done that for years.  She will continue to benefit from skilled therapy to address impairments and improve function.     OBJECTIVE IMPAIRMENTS Abnormal gait, decreased activity tolerance, decreased endurance, decreased mobility, difficulty walking, decreased ROM, decreased strength, impaired flexibility, and pain.    ACTIVITY LIMITATIONS stairs, transfers, bed mobility, and locomotion level   PARTICIPATION LIMITATIONS: yard work   PERSONAL FACTORS 1-2 comorbidities: bilat TKA and R foot surgery/bunionectomy  are also affecting patient's functional outcome.    REHAB POTENTIAL: Good   CLINICAL DECISION MAKING: Stable/uncomplicated   EVALUATION COMPLEXITY: Low     GOALS:     SHORT TERM GOALS: Target date: 05/30/2022   Pt will be independent and compliant with HEP for improved pain, strength, and function.  Baseline: Goal status: GOAL MET    2.  Pt will report at least a 25% improvement in pain and sx's overall.  Baseline:  Goal status: GOAL MET    3.  Pt will report she is able to sleep at least 4/7 nights per week without pain waking her up. Baseline:  Goal status: GOAL MET Target date:  06/06/2022     LONG TERM GOALS: Target date: 07/23/2022   Pt will report no pain after performing toilet transfers. Baseline:  Goal status: PROGRESSING   2.  Pt will demo improved bilat hip abd strength to 5/5 MMT for improved tolerance with functional mobility.  Baseline:  Goal status: NOT MET   3.  Pt will report she is able to perform her normal community ambulation without significant pain or difficulty.  Baseline:  Goal status: PROGRESSING   4.  Pt will be able to perform  gardening without significant pain.  Baseline:  Goal status: WILL REMOVE GOAL.  Pt states she doesn't garden       PLAN: PT FREQUENCY: 2x/week   PT DURATION: 4 weeks   PLANNED INTERVENTIONS: Therapeutic exercises, Therapeutic activity, Neuromuscular re-education, Balance training, Gait training, Patient/Family education, Joint mobilization, Stair training, Aquatic Therapy, Dry Needling, Electrical stimulation, Spinal mobilization, Cryotherapy, Moist heat, Taping, Ultrasound, Manual therapy, and Re-evaluation   PLAN FOR NEXT SESSION:  Progress core and LE strengthening.  HS and piriformis flexilbility.  Cont with STW to lumbar paraspinals and bilat hips.  Work on Conservation officer, nature.    Lenell Antu, Student-PT  During this treatment session, the therapist was present, participating in and directing the treatment.  Selinda Michaels III PT, DPT 07/02/22 2:46 PM

## 2022-07-04 ENCOUNTER — Ambulatory Visit (HOSPITAL_BASED_OUTPATIENT_CLINIC_OR_DEPARTMENT_OTHER): Payer: Medicare Other | Admitting: Physical Therapy

## 2022-07-04 ENCOUNTER — Encounter (HOSPITAL_BASED_OUTPATIENT_CLINIC_OR_DEPARTMENT_OTHER): Payer: Self-pay | Admitting: Physical Therapy

## 2022-07-04 DIAGNOSIS — R262 Difficulty in walking, not elsewhere classified: Secondary | ICD-10-CM

## 2022-07-04 DIAGNOSIS — M5459 Other low back pain: Secondary | ICD-10-CM

## 2022-07-04 DIAGNOSIS — M25552 Pain in left hip: Secondary | ICD-10-CM

## 2022-07-04 DIAGNOSIS — M25551 Pain in right hip: Secondary | ICD-10-CM

## 2022-07-04 DIAGNOSIS — M6281 Muscle weakness (generalized): Secondary | ICD-10-CM

## 2022-07-04 NOTE — Therapy (Signed)
OUTPATIENT PHYSICAL THERAPY TREATMENT NOTE     Patient Name: Diane Fox MRN: [id number] DOB:November 10, 1951, 71 y.o., female Today's Date: 07/04/2022  Visit Number:                          10 Number of visits:                     12 Date for PT Re-Evaluation:     07/23/2022 Authorization Type:                 Medicare A and B PT start time:                           0936 PT stop time:                           6568 PT Time Calculation:                40 mins Activity Tolerance:                     Pt tolerated treatment well Behavior During Therapy:       [facility] for tasks assessed/performed    Past Medical History:  Diagnosis Date   Arthritis    bilateral knees and hands   Asthma    allergy related, no attacks   Atypical chest pain 02/2014   april stress test=normal   Atypical lobular hyperplasia of breast 09/2002   right   Chronic kidney disease    GFR 58, mild   Edema    maxide prn   Environmental allergies    GERD (gastroesophageal reflux disease)    H/O measles    H/O mumps    Hypercholesterolemia    PONV (postoperative nausea and vomiting)    Past Surgical History:  Procedure Laterality Date   BREAST BIOPSY Right 09/2002   focal atypical lobular hyperplasia   BREAST SURGERY Right 10/03   focal atypia hyperplasia   DILATION AND CURETTAGE OF UTERUS     x2   FOOT SURGERY Right    Bunion   HYSTEROSCOPY WITH D & C  07/2007   polyp removed-benign   LIPOMA EXCISION Right 01/15/2019   Procedure: EXCISION RIGHT BACK LIPOMA;  Surgeon: Wallace Going, DO;  Location: [place];  Service: Plastics;  Laterality: Right;   PATELLA-FEMORAL ARTHROPLASTY Left 11/23/2013   Procedure: LEFT KNEE PATELLA-FEMORAL ARTHROPLASTY;  Surgeon: Gearlean Alf, MD;  Location: [facility];  Service: Orthopedics;  Laterality: Left;   RIGHT OOPHORECTOMY Right 11/91   tubal ligation with complications   TOTAL KNEE ARTHROPLASTY Left 07/05/2014   Procedure: LEFT TOTAL KNEE  ARTHROPLASTY;  Surgeon: Gearlean Alf, MD;  Location: [facility];  Service: Orthopedics;  Laterality: Left;   TOTAL KNEE ARTHROPLASTY Right 11/11/2017   Procedure: RIGHT TOTAL KNEE ARTHROPLASTY;  Surgeon: Gaynelle Arabian, MD;  Location: [facility];  Service: Orthopedics;  Laterality: Right;  with abductor block   TUBAL LIGATION  1991   WISDOM TOOTH EXTRACTION  early 20's   Patient Active Problem List   Diagnosis Date Noted   History of salpingoophorectomy 05/01/2021   Atypical lobular hyperplasia (ALH) of right breast 05/01/2021   Postmenopausal 05/01/2021   Lipoma of back 12/23/2018   Chronic kidney disease 09/26/2018   OA (osteoarthritis) of knee 11/23/2013   Chronic cough  01/23/2013   Hypercholesterolemia 11/05/2011   REFERRING PROVIDER: Gaynelle Arabian, MD    REFERRING DIAG: [phone] (ICD-10-CM) - Pain in left hip    THERAPY DIAG:  Other low back pain   Pain in left hip   Muscle weakness (generalized)   Pain in right hip   Difficulty in walking, not elsewhere classified   Rationale for Evaluation and Treatment Rehabilitation   ONSET DATE: 04/13/2022 MD visit/MD order   SUBJECTIVE:    SUBJECTIVE STATEMENT:  It is worse in the morning and takes a bit of time to warmup before improving. She has been using a roller on bilat glutes.  Pt denies any increased pain after prior Rx though felt like "she was beat up".  Pt states she was very busy yesterday helping a friend get medical care.  Pt helped pt with car transfers multiple times.  Pt states her R side of back is bothering her.      PERTINENT HISTORY: Lumbar DDD, L TKA 2015, R TKA 2018, R foot surgery 12/20/2020 bunionectomy/foot surgery, chronic kidney disease     PAIN:  Are you having pain? Not at rest currently NPRS:  Current: 1/10, Best:  0/10 Location:  R sided lower lumbar      PRECAUTIONS: Other: lumbar DDD, bilat TKA    OCCUPATION: Pt is retired.    PLOF: Independent; Pt was able to perform her daily activities  and mobility with less pain.   PATIENT GOALS to be pain free     OBJECTIVE:    DIAGNOSTIC FINDINGS: Pt had x rays at MD office though PT unable to see findings.  Pt reports she was informed that her hip looked good, but x rays showed lumbar DDD.      TODAY'S TREATMENT:  Manual Therapy: - STM to R glute and lumbar paraspinals in prone and also rolled L glute with roller to improve tightness, pain, and mobility   Therapeutic Exercise: Pt performed:  Supine SLR 2x10 with TrA activation S/L clamshells 2x10 bilat Bridging with TrA activation cueing, 2x10 Standing Hip abduction 2x10 R LE   Therapeutic Activities: (for improved functional mobility including ambulation and stairs) Sidestepping x3 laps Hip hiking with UE support 2x10 reps bilat Step-ups, 2x10 to 4inch step bilat     PATIENT EDUCATION:  Education details: HEP, POC, dx, and rationale of exercises.  Person educated: Patient Education method: Explanation, Demonstration, Tactile cues, Verbal cues, and Handouts Education comprehension: verbalized understanding, returned demonstration, verbal cues required, tactile cues required, and needs further education     HOME EXERCISE PROGRAM: Access Code: [email or web address] URL: [email or web address] Date: 05/09/2022 Prepared by: Ronny Flurry   Exercises - Supine Transversus Abdominis Bracing - Hands on Stomach  - 2 x daily - 7 x weekly - 2 sets - 10 reps - Hooklying Clamshell with Resistance  - 1 x daily - 4 x weekly - 2 sets - 10 reps - Supine Hamstring Stretch with Strap  - 2 x daily - 7 x weekly - 2 reps - 20-30 seconds hold - Supine Posterior Pelvic Tilt  - 2 x daily - 7 x weekly - 2 sets - 10 reps - Supine March  - 1-2 x daily - 7 x weekly - 2 sets - 10 reps - Sidelying Hip Abduction  - 1 x daily - 5 x weekly - 2 sets - 10 reps - Supine Piriformis Stretch with Foot on Ground  - 2 x daily - 7 x weekly - 2-3 reps - 20 -30 seconds  hold Updated HEP - Standing  Glute Med Mobilization with Small Ball on Wall  - 1 x daily     ASSESSMENT:   CLINICAL IMPRESSION: Pt is making progress overall.  She had to help her friend multiple times yesterday with car transfers and reports her R side of lumbar is bothering her some today.  Pt is progressing with tolerance for closed chain activities.  She performed exercises well with cuing for correct form and positioning.  Pt states she felt better after manual therapy.  Pt responded well to Rx reporting improved pain to 0/10 after Rx.  She should continue to benefit from continued skilled therapy to address impairments and ongoing goals and to improve overall function.     OBJECTIVE IMPAIRMENTS Abnormal gait, decreased activity tolerance, decreased endurance, decreased mobility, difficulty walking, decreased ROM, decreased strength, impaired flexibility, and pain.    ACTIVITY LIMITATIONS stairs, transfers, bed mobility, and locomotion level   PARTICIPATION LIMITATIONS: yard work   PERSONAL FACTORS 1-2 comorbidities: bilat TKA and R foot surgery/bunionectomy  are also affecting patient's functional outcome.    REHAB POTENTIAL: Good   CLINICAL DECISION MAKING: Stable/uncomplicated   EVALUATION COMPLEXITY: Low     GOALS:     SHORT TERM GOALS: Target date: 05/30/2022   Pt will be independent and compliant with HEP for improved pain, strength, and function.  Baseline: Goal status: GOAL MET    2.  Pt will report at least a 25% improvement in pain and sx's overall.  Baseline:  Goal status: GOAL MET    3.  Pt will report she is able to sleep at least 4/7 nights per week without pain waking her up. Baseline:  Goal status: GOAL MET Target date:  06/06/2022     LONG TERM GOALS: Target date: 07/23/2022   Pt will report no pain after performing toilet transfers. Baseline:  Goal status: PROGRESSING   2.  Pt will demo improved bilat hip abd strength to 5/5 MMT for improved tolerance with functional mobility.   Baseline:  Goal status: NOT MET   3.  Pt will report she is able to perform her normal community ambulation without significant pain or difficulty.  Baseline:  Goal status: PROGRESSING   4.  Pt will be able to perform gardening without significant pain.  Baseline:  Goal status: WILL REMOVE GOAL.  Pt states she doesn't garden       PLAN: PT FREQUENCY: 2x/week   PT DURATION: 4 weeks   PLANNED INTERVENTIONS: Therapeutic exercises, Therapeutic activity, Neuromuscular re-education, Balance training, Gait training, Patient/Family education, Joint mobilization, Stair training, Aquatic Therapy, Dry Needling, Electrical stimulation, Spinal mobilization, Cryotherapy, Moist heat, Taping, Ultrasound, Manual therapy, and Re-evaluation   PLAN FOR NEXT SESSION:  Progress core and LE strengthening.  HS and piriformis flexilbility.  Cont with STW to lumbar paraspinals and bilat hips.  Work on Conservation officer, nature.    Selinda Michaels III PT, DPT 07/04/22 2:44 PM

## 2022-07-09 ENCOUNTER — Ambulatory Visit (HOSPITAL_BASED_OUTPATIENT_CLINIC_OR_DEPARTMENT_OTHER): Payer: Medicare Other | Admitting: Physical Therapy

## 2022-07-09 ENCOUNTER — Encounter (HOSPITAL_BASED_OUTPATIENT_CLINIC_OR_DEPARTMENT_OTHER): Payer: Self-pay | Admitting: Physical Therapy

## 2022-07-09 DIAGNOSIS — M25552 Pain in left hip: Secondary | ICD-10-CM | POA: Diagnosis not present

## 2022-07-09 DIAGNOSIS — M25551 Pain in right hip: Secondary | ICD-10-CM | POA: Diagnosis not present

## 2022-07-09 DIAGNOSIS — M5459 Other low back pain: Secondary | ICD-10-CM

## 2022-07-09 DIAGNOSIS — M6281 Muscle weakness (generalized): Secondary | ICD-10-CM | POA: Diagnosis not present

## 2022-07-09 DIAGNOSIS — R262 Difficulty in walking, not elsewhere classified: Secondary | ICD-10-CM | POA: Diagnosis not present

## 2022-07-09 NOTE — Therapy (Signed)
OUTPATIENT PHYSICAL THERAPY TREATMENT NOTE     Patient Name: Diane Fox MRN: [id number] DOB:1951/12/07, 71 y.o., female Today's Date: 07/09/2022  Visit Number:                          11 Number of visits:                     15 Date for PT Re-Evaluation:     07/23/2022 Authorization Type:                 Medicare A and B PT start time:                           1302 PT stop time:                           0938 PT Time Calculation:                45 mins Activity Tolerance:                     Pt tolerated treatment well Behavior During Therapy:       [facility] for tasks assessed/performed    Past Medical History:  Diagnosis Date   Arthritis    bilateral knees and hands   Asthma    allergy related, no attacks   Atypical chest pain 02/2014   april stress test=normal   Atypical lobular hyperplasia of breast 09/2002   right   Chronic kidney disease    GFR 58, mild   Edema    maxide prn   Environmental allergies    GERD (gastroesophageal reflux disease)    H/O measles    H/O mumps    Hypercholesterolemia    PONV (postoperative nausea and vomiting)    Past Surgical History:  Procedure Laterality Date   BREAST BIOPSY Right 09/2002   focal atypical lobular hyperplasia   BREAST SURGERY Right 10/03   focal atypia hyperplasia   DILATION AND CURETTAGE OF UTERUS     x2   FOOT SURGERY Right    Bunion   HYSTEROSCOPY WITH D & C  07/2007   polyp removed-benign   LIPOMA EXCISION Right 01/15/2019   Procedure: EXCISION RIGHT BACK LIPOMA;  Surgeon: Wallace Going, DO;  Location: [place];  Service: Plastics;  Laterality: Right;   PATELLA-FEMORAL ARTHROPLASTY Left 11/23/2013   Procedure: LEFT KNEE PATELLA-FEMORAL ARTHROPLASTY;  Surgeon: Gearlean Alf, MD;  Location: [facility];  Service: Orthopedics;  Laterality: Left;   RIGHT OOPHORECTOMY Right 11/91   tubal ligation with complications   TOTAL KNEE ARTHROPLASTY Left 07/05/2014   Procedure: LEFT TOTAL KNEE  ARTHROPLASTY;  Surgeon: Gearlean Alf, MD;  Location: [facility];  Service: Orthopedics;  Laterality: Left;   TOTAL KNEE ARTHROPLASTY Right 11/11/2017   Procedure: RIGHT TOTAL KNEE ARTHROPLASTY;  Surgeon: Gaynelle Arabian, MD;  Location: [facility];  Service: Orthopedics;  Laterality: Right;  with abductor block   TUBAL LIGATION  1991   WISDOM TOOTH EXTRACTION  early 20's   Patient Active Problem List   Diagnosis Date Noted   History of salpingoophorectomy 05/01/2021   Atypical lobular hyperplasia (ALH) of right breast 05/01/2021   Postmenopausal 05/01/2021   Lipoma of back 12/23/2018   Chronic kidney disease 09/26/2018   OA (osteoarthritis) of knee 11/23/2013   Chronic cough  01/23/2013   Hypercholesterolemia 11/05/2011   REFERRING PROVIDER: Gaynelle Arabian, MD    REFERRING DIAG: [phone] (ICD-10-CM) - Pain in left hip    THERAPY DIAG:  Other low back pain   Pain in left hip   Muscle weakness (generalized)   Pain in right hip   Difficulty in walking, not elsewhere classified   Rationale for Evaluation and Treatment Rehabilitation   ONSET DATE: 04/13/2022 MD visit/MD order   SUBJECTIVE:    SUBJECTIVE STATEMENT: Pt reports she was very sore after prior Rx, felt uncomfortable.  [place] bil[place] post hips and bilat lumbar flanks all weekend.  Pt has no pain with sitting here currently.  Pt has increased pain rolling over in bed.  She has been using a roller on bilat glutes.  Pt is interested in dry needling and had questions concerning dry needling.     PERTINENT HISTORY: Lumbar DDD, L TKA 2015, R TKA 2018, R foot surgery 12/20/2020 bunionectomy/foot surgery, chronic kidney disease     PAIN:  Are you having pain? Not at rest currently NPRS:  Current: 0/10, Best:  0/10 Location:  R sided lower lumbar      PRECAUTIONS: Other: lumbar DDD, bilat TKA    OCCUPATION: Pt is retired.    PLOF: Independent; Pt was able to perform  her daily activities and mobility with less pain.   PATIENT GOALS to be pain free     OBJECTIVE:    DIAGNOSTIC FINDINGS: Pt had x rays at MD office though PT unable to see findings.  Pt reports she was informed that her hip looked good, but x rays showed lumbar DDD.      TODAY'S TREATMENT:  Manual Therapy: - STM to bilat lumbar paraspinals and bilat glutes in prone and also rolled bilat glutes with roller in prone to improve tightness, pain, and mobility   Therapeutic Exercise: Pt performed:  Supine SLR 2x10 with TrA activation S/L clamshells 2x10 bilat Bridging with TrA activation cueing, 2x10 Hooklying piriformis stretch 3x30 sec bilat Supine manual HS stretch 3x30 sec   Therapeutic Activities: (for improved functional mobility including ambulation and stairs) Sidestepping x 2 laps Hip hiking with UE support 2x10 reps bilat Step-ups, 2x10 to 4inch step bilat     PATIENT EDUCATION:  Education details: HEP, POC, dx, and rationale of exercises.  PT answered Pt's questions.  Person educated: Patient Education method: Explanation, Demonstration, Tactile cues, Verbal cues, and Handouts Education comprehension: verbalized understanding, returned demonstration, verbal cues required, tactile cues required, and needs further education     HOME EXERCISE PROGRAM: Access Code: [email or web address] URL: [email or web address] Date: 05/09/2022 Prepared by: Ronny Flurry   Exercises - Supine Transversus Abdominis Bracing - Hands on Stomach  - 2 x daily - 7 x weekly - 2 sets - 10 reps - Hooklying Clamshell with Resistance  - 1 x daily - 4 x weekly - 2 sets - 10 reps - Supine Hamstring Stretch with Strap  - 2 x daily - 7 x weekly - 2 reps - 20-30 seconds hold - Supine Posterior Pelvic Tilt  - 2 x daily - 7 x weekly - 2 sets - 10 reps - Supine March  - 1-2 x daily - 7 x weekly - 2 sets - 10 reps - Sidelying Hip Abduction  - 1 x daily - 5 x weekly - 2 sets - 10 reps - Supine  Piriformis Stretch with Foot on Ground  -  2 x daily - 7 x weekly - 2-3 reps - 20 -30 seconds hold Updated HEP - Standing Glute Med Mobilization with Small Ball on Wall  - 1 x daily     ASSESSMENT:   CLINICAL IMPRESSION: Pt is making progress overall though continues to have pain.  [place] has pain with rolling over in bed.  She has tightness and tenderness in bilat glutes.  She performed exercises well with cuing for correct form and positioning.  Pt did have some pain with closed chain activities; PT decreased laps of sidestepping.  Pt responded well to Rx reporting no pain after Rx though was fatigued.  Pt is interested in trying dry needling.  She should continue to benefit from continued skilled therapy to address impairments and ongoing goals and to improve overall function.    OBJECTIVE IMPAIRMENTS Abnormal gait, decreased activity tolerance, decreased endurance, decreased mobility, difficulty walking, decreased ROM, decreased strength, impaired flexibility, and pain.    ACTIVITY LIMITATIONS stairs, transfers, bed mobility, and locomotion level   PARTICIPATION LIMITATIONS: yard work   PERSONAL FACTORS 1-2 comorbidities: bilat TKA and R foot surgery/bunionectomy  are also affecting patient's functional outcome.    REHAB POTENTIAL: Good   CLINICAL DECISION MAKING: Stable/uncomplicated   EVALUATION COMPLEXITY: Low     GOALS:     SHORT TERM GOALS: Target date: 05/30/2022   Pt will be independent and compliant with HEP for improved pain, strength, and function.  Baseline: Goal status: GOAL MET    2.  Pt will report at least a 25% improvement in pain and sx's overall.  Baseline:  Goal status: GOAL MET    3.  Pt will report she is able to sleep at least 4/7 nights per week without pain waking her up. Baseline:  Goal status: GOAL MET Target date:  06/06/2022     LONG TERM GOALS: Target date: 07/23/2022   Pt will report no  pain after performing toilet transfers. Baseline:  Goal status: PROGRESSING   2.  Pt will demo improved bilat hip abd strength to 5/5 MMT for improved tolerance with functional mobility.  Baseline:  Goal status: NOT MET   3.  Pt will report she is able to perform her normal community ambulation without significant pain or difficulty.  Baseline:  Goal status: PROGRESSING   4.  Pt will be able to perform gardening without significant pain.  Baseline:  Goal status: WILL REMOVE GOAL.  Pt states she doesn't garden       PLAN: PT FREQUENCY: 2x/week   PT DURATION: 4 weeks   PLANNED INTERVENTIONS: Therapeutic exercises, Therapeutic activity, Neuromuscular re-education, Balance training, Gait training, Patient/Family education, Joint mobilization, Stair training, Aquatic Therapy, Dry Needling, Electrical stimulation, Spinal mobilization, Cryotherapy, Moist heat, Taping, Ultrasound, Manual therapy, and Re-evaluation   PLAN FOR NEXT SESSION:  Progress core and LE strengthening.  HS and piriformis flexilbility.  Cont with STW to lumbar paraspinals and bilat hips.  Work on Conservation officer, nature.  Will check schedule for dry needling.     Selinda Michaels III PT, DPT 07/09/22 4:53 PM

## 2022-07-11 ENCOUNTER — Ambulatory Visit (HOSPITAL_BASED_OUTPATIENT_CLINIC_OR_DEPARTMENT_OTHER): Payer: Medicare Other | Admitting: Physical Therapy

## 2022-07-11 ENCOUNTER — Encounter (HOSPITAL_BASED_OUTPATIENT_CLINIC_OR_DEPARTMENT_OTHER): Payer: Self-pay | Admitting: Physical Therapy

## 2022-07-11 DIAGNOSIS — M25551 Pain in right hip: Secondary | ICD-10-CM | POA: Diagnosis not present

## 2022-07-11 DIAGNOSIS — M6281 Muscle weakness (generalized): Secondary | ICD-10-CM | POA: Diagnosis not present

## 2022-07-11 DIAGNOSIS — R262 Difficulty in walking, not elsewhere classified: Secondary | ICD-10-CM | POA: Diagnosis not present

## 2022-07-11 DIAGNOSIS — M5459 Other low back pain: Secondary | ICD-10-CM

## 2022-07-11 DIAGNOSIS — M25552 Pain in left hip: Secondary | ICD-10-CM

## 2022-07-11 NOTE — Therapy (Incomplete)
OUTPATIENT PHYSICAL THERAPY TREATMENT NOTE     Patient Name: Diane Fox MRN: [id number] DOB:June 19, 1951, 71 y.o., female Today's Date: 07/11/2022  Visit Number:                          12 Number of visits:                     15 Date for PT Re-Evaluation:     07/23/2022 Authorization Type:                 Medicare A and B PT start time:                           401 PT stop time:                           1020 PT Time Calculation:                44 mins Activity Tolerance:                     Pt tolerated treatment well Behavior During Therapy:       [facility] for tasks assessed/performed    Past Medical History:  Diagnosis Date   Arthritis    bilateral knees and hands   Asthma    allergy related, no attacks   Atypical chest pain 02/2014   april stress test=normal   Atypical lobular hyperplasia of breast 09/2002   right   Chronic kidney disease    GFR 58, mild   Edema    maxide prn   Environmental allergies    GERD (gastroesophageal reflux disease)    H/O measles    H/O mumps    Hypercholesterolemia    PONV (postoperative nausea and vomiting)    Past Surgical History:  Procedure Laterality Date   BREAST BIOPSY Right 09/2002   focal atypical lobular hyperplasia   BREAST SURGERY Right 10/03   focal atypia hyperplasia   DILATION AND CURETTAGE OF UTERUS     x2   FOOT SURGERY Right    Bunion   HYSTEROSCOPY WITH D & C  07/2007   polyp removed-benign   LIPOMA EXCISION Right 01/15/2019   Procedure: EXCISION RIGHT BACK LIPOMA;  Surgeon: Wallace Going, DO;  Location: [place];  Service: Plastics;  Laterality: Right;   PATELLA-FEMORAL ARTHROPLASTY Left 11/23/2013   Procedure: LEFT KNEE PATELLA-FEMORAL ARTHROPLASTY;  Surgeon: Gearlean Alf, MD;  Location: [facility];  Service: Orthopedics;  Laterality: Left;   RIGHT OOPHORECTOMY Right 11/91   tubal ligation with complications   TOTAL KNEE ARTHROPLASTY Left 07/05/2014   Procedure: LEFT TOTAL KNEE  ARTHROPLASTY;  Surgeon: Gearlean Alf, MD;  Location: [facility];  Service: Orthopedics;  Laterality: Left;   TOTAL KNEE ARTHROPLASTY Right 11/11/2017   Procedure: RIGHT TOTAL KNEE ARTHROPLASTY;  Surgeon: Gaynelle Arabian, MD;  Location: [facility];  Service: Orthopedics;  Laterality: Right;  with abductor block   TUBAL LIGATION  1991   WISDOM TOOTH EXTRACTION  early 20's   Patient Active Problem List   Diagnosis Date Noted   History of salpingoophorectomy 05/01/2021   Atypical lobular hyperplasia (ALH) of right breast 05/01/2021   Postmenopausal 05/01/2021   Lipoma of back 12/23/2018   Chronic kidney disease 09/26/2018   OA (osteoarthritis) of knee 11/23/2013   Chronic cough  01/23/2013   Hypercholesterolemia 11/05/2011   REFERRING PROVIDER: Gaynelle Arabian, MD    REFERRING DIAG: [phone] (ICD-10-CM) - Pain in left hip    THERAPY DIAG:  Other low back pain   Pain in left hip   Muscle weakness (generalized)   Pain in right hip   Difficulty in walking, not elsewhere classified   Rationale for Evaluation and Treatment Rehabilitation   ONSET DATE: 04/13/2022 MD visit/MD order   SUBJECTIVE:    SUBJECTIVE STATEMENT: Pt reports she was very sore after prior Rx, felt like she was "beat up".  Pt states she was having some pain in R sided lumbar and R hip this AM with walking.  Pt states her R hip feels worse than her L hip.  Pt reports no new functional improvements.  [place] and felt uncomfortable in bilat post hips and bilat lumbar flanks all weekend.  Pt has increased pain rolling over in bed.  She has been using a roller on bilat glutes and reports having increased pain rolling with ball.  Pt is interested in dry needling.    PERTINENT HISTORY: Lumbar DDD, L TKA 2015, R TKA 2018, R foot surgery 12/20/2020 bunionectomy/foot surgery, chronic kidney disease     PAIN:  Are you having pain? Not at rest currently NPRS:  Current: 1/10, Best:   0/10 Location:  R sided lower lumbar      PRECAUTIONS: Other: lumbar DDD, bilat TKA    OCCUPATION: Pt is retired.    PLOF: Independent; Pt was able to perform her daily activities and mobility with less pain.   PATIENT GOALS to be pain free     OBJECTIVE:    DIAGNOSTIC FINDINGS: Pt had x rays at MD office though PT unable to see findings.  Pt reports she was informed that her hip looked good, but x rays showed lumbar DDD.      TODAY'S TREATMENT:  Manual Therapy: - STM to bilat lumbar paraspinals and bilat glutes in prone and also rolled bilat glutes with roller in prone to improve tightness, pain, and mobility. - PT instructed pt in using the theracane and pt used in the clinic on her R glute.    Therapeutic Exercise: Pt performed:   Nustep x 5 mins at L2-3 with bilat UE/LE   Supine SLR 2x10 with TrA activation  S/L hip abduction x10 reps and with 1# above knee x 10 reps bilat S/L clamshells 2x10 bilat Bridging with TrA activation cueing, 2x10 Hooklying piriformis stretch 3x30 sec bilat Supine manual HS stretch 3x30 sec   Therapeutic Activities: (for improved functional mobility including ambulation and stairs) Sidestepping x 2 laps Hip hiking with UE support 2x10 reps bilat Step-ups, 2x10 to 4inch step bilat     PATIENT EDUCATION:  Education details: HEP, POC, and rationale of exercises.  PT answered Pt's questions. Using theracane Person educated: Patient Education method: Explanation, Demonstration, Tactile cues, Verbal cues, and Handouts Education comprehension: verbalized understanding, returned demonstration, verbal cues required, tactile cues required, and needs further education     HOME EXERCISE PROGRAM: Access Code: [email or web address] URL: [email or web address] Date: 05/09/2022 Prepared by: Ronny Flurry   Exercises - Supine Transversus Abdominis Bracing - Hands on Stomach  - 2 x daily - 7 x weekly - 2 sets - 10 reps - Hooklying Clamshell with  Resistance  - 1 x daily - 4 x weekly - 2 sets - 10 reps - Supine Hamstring Stretch with Strap  -  2 x daily - 7 x weekly - 2 reps - 20-30 seconds hold - Supine Posterior Pelvic Tilt  - 2 x daily - 7 x weekly - 2 sets - 10 reps - Supine March  - 1-2 x daily - 7 x weekly - 2 sets - 10 reps - Sidelying Hip Abduction  - 1 x daily - 5 x weekly - 2 sets - 10 reps - Supine Piriformis Stretch with Foot on Ground  - 2 x daily - 7 x weekly - 2-3 reps - 20 -30 seconds hold Updated HEP - Standing Glute Med Mobilization with Small Ball on Wall  - 1 x daily     ASSESSMENT:   CLINICAL IMPRESSION:  She has improved L hip pain though her R hip is bothering her more than her L hip.  Pt seems to be improving with glute med having improved performance and range with S/L'ing hip abduction.  Pt gives good effort with all exercises and performed exercises well.  Pt has improved tightness in L hip and glute with decreased trigger points and tenderness.  Pt reports improved tightness in R hip after STW.  PT instructed pt in using a theracane and pt performed in the clinic.  PT instructed pt to not perform STW with ball as intensely as she has to R hip due to R hip pain.  Pt responded well to Rx and was fatigued after Rx.     OBJECTIVE IMPAIRMENTS Abnormal gait, decreased activity tolerance, decreased endurance, decreased mobility, difficulty walking, decreased ROM, decreased strength, impaired flexibility, and pain.    ACTIVITY LIMITATIONS stairs, transfers, bed mobility, and locomotion level   PARTICIPATION LIMITATIONS: yard work   PERSONAL FACTORS 1-2 comorbidities: bilat TKA and R foot surgery/bunionectomy  are also affecting patient's functional outcome.    REHAB POTENTIAL: Good   CLINICAL DECISION MAKING: Stable/uncomplicated   EVALUATION COMPLEXITY: Low     GOALS:     SHORT TERM GOALS: Target date: 05/30/2022   Pt will be independent and compliant with HEP for improved pain, strength, and function.   Baseline: Goal status: GOAL MET    2.  Pt will report at least a 25% improvement in pain and sx's overall.  Baseline:  Goal status: GOAL MET    3.  Pt will report she is able to sleep at least 4/7 nights per week without pain waking her up. Baseline:  Goal status: GOAL MET Target date:  06/06/2022     LONG TERM GOALS: Target date: 07/23/2022   Pt will report no pain after performing toilet transfers. Baseline:  Goal status: PROGRESSING   2.  Pt will demo improved bilat hip abd strength to 5/5 MMT for improved tolerance with functional mobility.  Baseline:  Goal status: NOT MET   3.  Pt will report she is able to perform her normal community ambulation without significant pain or difficulty.  Baseline:  Goal status: PROGRESSING   4.  Pt will be able to perform gardening without significant pain.  Baseline:  Goal status: WILL REMOVE GOAL.  Pt states she doesn't garden       PLAN: PT FREQUENCY: 2x/week   PT DURATION: 4 weeks   PLANNED INTERVENTIONS: Therapeutic exercises, Therapeutic activity, Neuromuscular re-education, Balance training, Gait training, Patient/Family education, Joint mobilization, Stair training, Aquatic Therapy, Dry Needling, Electrical stimulation, Spinal mobilization, Cryotherapy, Moist heat, Taping, Ultrasound, Manual therapy, and Re-evaluation   PLAN FOR NEXT SESSION:  Progress core and LE strengthening.  HS and piriformis flexilbility.  Cont with STW to lumbar paraspinals and bilat hips.  Work on Conservation officer, nature.  Will check schedule for dry needling.     Selinda Michaels III PT, DPT 07/12/22 8:42 AM

## 2022-07-18 ENCOUNTER — Encounter (HOSPITAL_BASED_OUTPATIENT_CLINIC_OR_DEPARTMENT_OTHER): Payer: Medicare Other | Admitting: Physical Therapy

## 2022-07-18 ENCOUNTER — Encounter (HOSPITAL_BASED_OUTPATIENT_CLINIC_OR_DEPARTMENT_OTHER): Payer: Self-pay

## 2022-07-20 NOTE — Therapy (Signed)
OUTPATIENT PHYSICAL THERAPY TREATMENT NOTE / DISCHARGE SUMMARY  Progress Note Reporting Period 06/27/2022 to 07/23/2022  See note below for Objective Data and Assessment of Progress/Goals.        Patient Name: Diane Fox MRN: [id number] DOB:[birth year], 71 y.o., female Today's Date: 07/23/2022  Visit Number:                          13 Number of visits:                     13 Date for PT Re-Evaluation:     07/23/2022 Authorization Type:                 Medicare A and B PT start time:                           119 PT stop time:                           417 PT Time Calculation:                40 mins Activity Tolerance:                     Pt tolerated treatment well Behavior During Therapy:       [facility] for tasks assessed/performed    Past Medical History:  Diagnosis Date   Arthritis    bilateral knees and hands   Asthma    allergy related, no attacks   Atypical chest pain 02/2014   april stress test=normal   Atypical lobular hyperplasia of breast 09/2002   right   Chronic kidney disease    GFR 58, mild   Edema    maxide prn   Environmental allergies    GERD (gastroesophageal reflux disease)    H/O measles    H/O mumps    Hypercholesterolemia    PONV (postoperative nausea and vomiting)    Past Surgical History:  Procedure Laterality Date   BREAST BIOPSY Right 09/2002   focal atypical lobular hyperplasia   BREAST SURGERY Right 10/03   focal atypia hyperplasia   DILATION AND CURETTAGE OF UTERUS     x2   FOOT SURGERY Right    Bunion   HYSTEROSCOPY WITH D & C  07/2007   polyp removed-benign   LIPOMA EXCISION Right 01/15/2019   Procedure: EXCISION RIGHT BACK LIPOMA;  Surgeon: Wallace Going, DO;  Location: [place];  Service: Plastics;  Laterality: Right;   PATELLA-FEMORAL ARTHROPLASTY Left 11/23/2013   Procedure: LEFT KNEE PATELLA-FEMORAL ARTHROPLASTY;  Surgeon: Gearlean Alf, MD;  Location: [facility];  Service: Orthopedics;  Laterality:  Left;   RIGHT OOPHORECTOMY Right 11/91   tubal ligation with complications   TOTAL KNEE ARTHROPLASTY Left 07/05/2014   Procedure: LEFT TOTAL KNEE ARTHROPLASTY;  Surgeon: Gearlean Alf, MD;  Location: [facility];  Service: Orthopedics;  Laterality: Left;   TOTAL KNEE ARTHROPLASTY Right 11/11/2017   Procedure: RIGHT TOTAL KNEE ARTHROPLASTY;  Surgeon: Gaynelle Arabian, MD;  Location: [facility];  Service: Orthopedics;  Laterality: Right;  with abductor block   TUBAL LIGATION  1991   WISDOM TOOTH EXTRACTION  early 20's   Patient Active Problem List   Diagnosis Date Noted   History of salpingoophorectomy 05/01/2021   Atypical lobular hyperplasia [facility]) of right breast 05/01/2021  Postmenopausal 05/01/2021   Lipoma of back 12/23/2018   Chronic kidney disease 09/26/2018   OA (osteoarthritis) of knee 11/23/2013   Chronic cough 01/23/2013   Hypercholesterolemia 11/05/2011   REFERRING PROVIDER: Gaynelle Arabian, MD    REFERRING DIAG: [phone] (ICD-10-CM) - Pain in left hip    THERAPY DIAG:  Other low back pain   Pain in left hip   Muscle weakness (generalized)   Pain in right hip   Difficulty in walking, not elsewhere classified   Rationale for Evaluation and Treatment Rehabilitation   ONSET DATE: 04/13/2022 MD visit/MD order   SUBJECTIVE:    SUBJECTIVE STATEMENT: Pain is worst 1st thing in AM. HOME MANAGEMENT STRATEGIES:  Pt purchased a theracane and has been using it.  Pt reports her tender spots are not as tender.  Pt reports compliance with HEP.  Pt used a tennis ball for STW to glute.  FUNCTIONAL IMPROVEMENTS:  pain is not as frequent. Pain doesn't last as long.  Pt reports no pain with toilet transfers.  Pt reports she is ambulating her normal community distance without significant pain or difficulty.      FUNCTIONAL LIMITATIONS:  vacuuming   PERTINENT HISTORY: Lumbar DDD, L TKA 2015, R TKA 2018, R foot surgery 12/20/2020 bunionectomy/foot surgery, chronic kidney disease     PAIN:   Are you having pain? Not at rest currently NPRS:  Current and Best:  0/10, Worst:  5/10 Location:  R sided lower lumbar      PRECAUTIONS: Other: lumbar DDD, bilat TKA    OCCUPATION: Pt is retired.    PLOF: Independent; Pt was able to perform her daily activities and mobility with less pain.   PATIENT GOALS to be pain free     OBJECTIVE:    DIAGNOSTIC FINDINGS: Pt had x rays at MD office though PT unable to see findings.  Pt reports she was informed that her hip looked good, but x rays showed lumbar DDD.      TODAY'S TREATMENT:  PATIENT SURVEYS:  FOTO 66 which has decreased from prior 75.   Goal was 64 at visit #12.     LUMBAR AROM: Flex:  75% which has improved from prior 60% Ext:  WNL SB:  R:  WFL, L:  WFL  without pain Rot:  WFL bilat   Strength: Hip Abd:  5/5 currently which has improved from prior R:  4+/5, L:  4-/5  Gait: symmetrical stance time bilat without favoring R LE   Therapeutic Exercise: Assessed ROM, strength, and gait. Reviewed and updated HEP. Pt performed:       Sidestepping x 2 laps        Hip hiking with UE support 2x10 reps bilat        Bridging with TrA activation cueing, 2x10   Manual Therapy: - STM to bilat glutes in prone and also rolled bilat glutes with roller in prone to improve tightness, pain, and mobility. - PT instructed pt in using the theracane.      PATIENT EDUCATION:  Education details: objective findings, goal progress, HEP, POC, and rationale of exercises.  PT answered Pt's questions. Using theracane Person educated: Patient Education method: Explanation, Demonstration, Tactile cues, Verbal cues, and Handouts Education comprehension: verbalized understanding, returned demonstration, verbal cues required, tactile cues required, and needs further education     HOME EXERCISE PROGRAM: Access Code: [email or web address] URL: [email or web address] Date: 05/09/2022 Prepared by: Ronny Flurry   Exercises - Supine  Transversus Abdominis Bracing - Hands on Stomach  -  2 x daily - 7 x weekly - 2 sets - 10 reps - Hooklying Clamshell with Resistance  - 1 x daily - 4 x weekly - 2 sets - 10 reps - Supine Hamstring Stretch with Strap  - 2 x daily - 7 x weekly - 2 reps - 20-30 seconds hold - Supine Posterior Pelvic Tilt  - 2 x daily - 7 x weekly - 2 sets - 10 reps - Supine March  - 1-2 x daily - 7 x weekly - 2 sets - 10 reps - Sidelying Hip Abduction  - 1 x daily - 5 x weekly - 2 sets - 10 reps - Supine Piriformis Stretch with Foot on Ground  - 2 x daily - 7 x weekly - 2-3 reps - 20 -30 seconds hold - Standing Glute Med Mobilization with Small Ball on Wall  - 1 x daily Updated HEP today: - Side Stepping with Counter Support  - 1 x daily - 4 x weekly - 2-3 sets - 10 reps - Standing Hip Hiking  - 1 x daily - 5 x weekly - 2 sets - 10 reps - Supine Bridge  - 1 x daily - 5-6 x weekly - 2 sets - 10 reps    ASSESSMENT:   CLINICAL IMPRESSION: Pt has progressed well in all areas.  She demonstrates improved quality of gait with = Wb'ing in bilat LE's.  Pt demonstrates improved LE strength having 5/5 in bilat hip abd.  She also demonstrates improved lumbar flexion AROM.  Pt has recently purchased a theracane and is using that for improved soft tissue mobility.  Pt has improved with performance of transfers and has no pain with toilet transfers.  PT reviewed and updated HEP.  Pt received a HEP handout and was educated in correct form and appropriate frequency.  Pt is independent with HEP.  She has met all goals and is ready for discharge.     OBJECTIVE IMPAIRMENTS Abnormal gait, decreased activity tolerance, decreased endurance, decreased mobility, difficulty walking, decreased ROM, decreased strength, impaired flexibility, and pain.    ACTIVITY LIMITATIONS stairs, transfers, bed mobility, and locomotion level   PARTICIPATION LIMITATIONS: yard work   PERSONAL FACTORS 1-2 comorbidities: bilat TKA and R foot  surgery/bunionectomy  are also affecting patient's functional outcome.    REHAB POTENTIAL: Good   CLINICAL DECISION MAKING: Stable/uncomplicated   EVALUATION COMPLEXITY: Low     GOALS:     SHORT TERM GOALS: Target date: 05/30/2022   Pt will be independent and compliant with HEP for improved pain, strength, and function.  Baseline: Goal status: GOAL MET    2.  Pt will report at least a 25% improvement in pain and sx's overall.  Baseline:  Goal status: GOAL MET    3.  Pt will report she is able to sleep at least 4/7 nights per week without pain waking her up. Baseline:  Goal status: GOAL MET Target date:  06/06/2022     LONG TERM GOALS: Target date: 07/23/2022   Pt will report no pain after performing toilet transfers. Baseline:  Goal status: GOAL MET (07/23/2022)   2.  Pt will demo improved bilat hip abd strength to 5/5 MMT for improved tolerance with functional mobility.  Baseline:  Goal status: GOAL MET (07/23/2022)   3.  Pt will report she is able to perform her normal community ambulation without significant pain or difficulty.  Baseline:  Goal status: GOAL MET (07/23/2022)  PLAN:    PLANNED INTERVENTIONS: Therapeutic exercises, Therapeutic activity, Neuromuscular re-education, Balance training, Gait training, Patient/Family education, Joint mobilization, Stair training, Aquatic Therapy, Dry Needling, Electrical stimulation, Spinal mobilization, Cryotherapy, Moist heat, Taping, Ultrasound, Manual therapy, and Re-evaluation   PLAN FOR NEXT SESSION:  Pt to be discharged from skilled PT services due to meeting all goals.  She is independent and will cont with HEP.  Pt will use theracane and/or tennis ball for STW.  She is agreeable with discharge.    PHYSICAL THERAPY DISCHARGE SUMMARY  Visits from Start of Care: 13  Current functional level related to goals / functional outcomes: See above   Remaining deficits: See above   Education / Equipment: Pt has a  HEP.     Patient agrees to discharge. Patient goals were met. Patient is being discharged due to meeting the stated rehab goals.    Selinda Michaels III PT, DPT 07/23/22 3:46 PM

## 2022-07-23 ENCOUNTER — Encounter (HOSPITAL_BASED_OUTPATIENT_CLINIC_OR_DEPARTMENT_OTHER): Payer: Self-pay | Admitting: Physical Therapy

## 2022-07-23 ENCOUNTER — Ambulatory Visit (HOSPITAL_BASED_OUTPATIENT_CLINIC_OR_DEPARTMENT_OTHER): Payer: Medicare Other | Attending: Orthopedic Surgery | Admitting: Physical Therapy

## 2022-07-23 DIAGNOSIS — M5459 Other low back pain: Secondary | ICD-10-CM | POA: Insufficient documentation

## 2022-07-23 DIAGNOSIS — M25551 Pain in right hip: Secondary | ICD-10-CM | POA: Diagnosis not present

## 2022-07-23 DIAGNOSIS — R262 Difficulty in walking, not elsewhere classified: Secondary | ICD-10-CM | POA: Insufficient documentation

## 2022-07-23 DIAGNOSIS — M6281 Muscle weakness (generalized): Secondary | ICD-10-CM | POA: Insufficient documentation

## 2022-07-23 DIAGNOSIS — M25552 Pain in left hip: Secondary | ICD-10-CM | POA: Insufficient documentation

## 2022-09-17 DIAGNOSIS — Z23 Encounter for immunization: Secondary | ICD-10-CM | POA: Diagnosis not present

## 2022-10-03 ENCOUNTER — Ambulatory Visit
Admission: RE | Admit: 2022-10-03 | Discharge: 2022-10-03 | Disposition: A | Discharge status: Home / Self Care | Payer: Medicare Other | Source: Ambulatory Visit | Attending: Family Medicine | Admitting: Family Medicine

## 2022-10-03 DIAGNOSIS — Z1231 Encounter for screening mammogram for malignant neoplasm of breast: Secondary | ICD-10-CM | POA: Diagnosis not present

## 2022-10-03 DIAGNOSIS — M85851 Other specified disorders of bone density and structure, right thigh: Secondary | ICD-10-CM | POA: Diagnosis not present

## 2022-10-03 DIAGNOSIS — Z780 Asymptomatic menopausal state: Secondary | ICD-10-CM | POA: Diagnosis not present

## 2022-10-03 DIAGNOSIS — M8580 Other specified disorders of bone density and structure, unspecified site: Secondary | ICD-10-CM

## 2022-10-10 DIAGNOSIS — H04123 Dry eye syndrome of bilateral lacrimal glands: Secondary | ICD-10-CM | POA: Diagnosis not present

## 2022-10-10 DIAGNOSIS — H2513 Age-related nuclear cataract, bilateral: Secondary | ICD-10-CM | POA: Diagnosis not present

## 2022-10-10 DIAGNOSIS — H02422 Myogenic ptosis of left eyelid: Secondary | ICD-10-CM | POA: Diagnosis not present

## 2022-10-10 DIAGNOSIS — H43811 Vitreous degeneration, right eye: Secondary | ICD-10-CM | POA: Diagnosis not present

## 2022-10-11 DIAGNOSIS — I700 Atherosclerosis of aorta: Secondary | ICD-10-CM | POA: Diagnosis not present

## 2022-10-11 DIAGNOSIS — N62 Hypertrophy of breast: Secondary | ICD-10-CM | POA: Diagnosis not present

## 2022-10-11 DIAGNOSIS — R7303 Prediabetes: Secondary | ICD-10-CM | POA: Diagnosis not present

## 2022-10-11 DIAGNOSIS — E782 Mixed hyperlipidemia: Secondary | ICD-10-CM | POA: Diagnosis not present

## 2022-10-11 DIAGNOSIS — R92343 Mammographic extreme density, bilateral breasts: Secondary | ICD-10-CM | POA: Diagnosis not present

## 2022-10-11 DIAGNOSIS — E559 Vitamin D deficiency, unspecified: Secondary | ICD-10-CM | POA: Diagnosis not present

## 2022-10-11 DIAGNOSIS — M8580 Other specified disorders of bone density and structure, unspecified site: Secondary | ICD-10-CM | POA: Diagnosis not present

## 2022-10-11 DIAGNOSIS — I10 Essential (primary) hypertension: Secondary | ICD-10-CM | POA: Diagnosis not present

## 2022-10-12 ENCOUNTER — Other Ambulatory Visit: Payer: Self-pay | Admitting: Family Medicine

## 2022-10-12 DIAGNOSIS — N6099 Unspecified benign mammary dysplasia of unspecified breast: Secondary | ICD-10-CM

## 2022-10-12 DIAGNOSIS — R92343 Mammographic extreme density, bilateral breasts: Secondary | ICD-10-CM

## 2022-11-02 ENCOUNTER — Ambulatory Visit
Admission: RE | Admit: 2022-11-02 | Discharge: 2022-11-02 | Disposition: A | Discharge status: Home / Self Care | Payer: Medicare Other | Source: Ambulatory Visit | Attending: Family Medicine | Admitting: Family Medicine

## 2022-11-02 DIAGNOSIS — R92343 Mammographic extreme density, bilateral breasts: Secondary | ICD-10-CM

## 2022-11-02 DIAGNOSIS — R9230 Dense breasts, unspecified: Secondary | ICD-10-CM | POA: Diagnosis not present

## 2022-11-02 DIAGNOSIS — N6099 Unspecified benign mammary dysplasia of unspecified breast: Secondary | ICD-10-CM

## 2022-11-02 MED ORDER — GADOPICLENOL 0.5 MMOL/ML IV SOLN
8.0000 mL | Freq: Once | INTRAVENOUS | Status: AC | PRN
  Administered 2022-11-02: 8 mL via INTRAVENOUS

## 2023-01-25 NOTE — Progress Notes (Deleted)
Diane Fox Date of Birth: 10/26/1951 Medical Record [id number]  History of Present Illness: Seen today for followup hypercholesterolemia and coronary calcification. She has a history of hypercholesterolemia and is on Crestor therapy.  She had a normal stress Echo in April 2015. 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   In 2019 she complained of DOE. She has a chronic cough with extensive pulmonary evaluation before. Stress Echo in June 2019 was normal. Prior CT chest in 2016 was normal except for some coronary calcification.   On follow up today she is staying  active. She walks daily. She does report over the past month that she has an ache in her heart. No real pain. May last most of the day. Not really associated with anything. Has had some recent stressors.  Current Outpatient Medications on File Prior to Visit  Medication Sig Dispense Refill   aspirin EC 81 MG tablet Take 81 mg by mouth daily.     Calcium Carbonate (CALCIUM 600 PO) Take by mouth.     chlorpheniramine (CHLOR-TRIMETON) 4 MG tablet Take 4 mg by mouth daily as needed for allergies.      cholecalciferol (VITAMIN D) 1000 units tablet Take 1,000 Units by mouth daily.     Coenzyme Q10 (CO Q 10 PO) Take 200 mg by mouth daily.     fexofenadine (ALLEGRA) 180 MG tablet Take 180 mg by mouth daily.     losartan (COZAAR) 25 MG tablet      Melatonin 1 MG CAPS daily as needed.     metoprolol tartrate (LOPRESSOR) 50 MG tablet Take 50 mg 2 hours before Coronary CT 1 tablet 0   Multiple Vitamins-Minerals (CENTRUM SILVER PO) Take by mouth.     omeprazole (PRILOSEC) 20 MG capsule Take 20 mg by mouth daily.     rosuvastatin (CRESTOR) 10 MG tablet TAKE 1 TABLET BY MOUTH  DAILY 90 tablet 3   triamcinolone (NASACORT) 55 MCG/ACT AERO nasal inhaler Place 2 sprays into the nose daily.     triamterene-hydrochlorothiazide (MAXZIDE) 75-50 MG per tablet Take 1 tablet by mouth every morning.     vitamin C (ASCORBIC ACID) 500 MG tablet  Take 500 mg by mouth daily.     vitamin E 400 UNIT capsule Take 400 Units by mouth daily.     No current facility-administered medications on file prior to visit.    Allergies  Allergen Reactions   Biaxin [Clarithromycin] Hives   Penicillins Hives    Has patient had a PCN reaction causing immediate rash, facial/tongue/throat swelling, SOB or lightheadedness with hypotension: No Has patient had a PCN reaction causing severe rash involving mucus membranes or skin necrosis: No Has patient had a PCN reaction that required hospitalization: No Has patient had a PCN reaction occurring within the last 10 years: Yes If all of the above answers are "NO", then may proceed with Cephalosporin use.    Sulfa Antibiotics Hives   Zostavax [Zoster Vaccine Live] Hives    Past Medical History:  Diagnosis Date   Arthritis    bilateral knees and hands   Asthma    allergy related, no attacks   Atypical chest pain 02/2014   april stress test=normal   Atypical lobular hyperplasia of breast 09/2002   right   Chronic kidney disease    GFR 58, mild   Edema    maxide prn   Environmental allergies    GERD (gastroesophageal reflux disease)    H/O measles  H/O mumps    Hypercholesterolemia    PONV (postoperative nausea and vomiting)     Past Surgical History:  Procedure Laterality Date   BREAST BIOPSY Right 09/2002   focal atypical lobular hyperplasia   BREAST SURGERY Right 10/03   focal atypia hyperplasia   DILATION AND CURETTAGE OF UTERUS     x2   FOOT SURGERY Right    Bunion   HYSTEROSCOPY WITH D & C  07/2007   polyp removed-benign   LIPOMA EXCISION Right 01/15/2019   Procedure: EXCISION RIGHT BACK LIPOMA;  Surgeon: Wallace Going, DO;  Location: [place];  Service: Plastics;  Laterality: Right;   PATELLA-FEMORAL ARTHROPLASTY Left 11/23/2013   Procedure: LEFT KNEE PATELLA-FEMORAL ARTHROPLASTY;  Surgeon: Gearlean Alf, MD;  Location[facility];  Service: Orthopedics;   Laterality: Left;   RIGHT OOPHORECTOMY Right 11/91   tubal ligation with complications   TOTAL KNEE ARTHROPLASTY Left 07/05/2014   Procedure: LEFT TOTAL KNEE ARTHROPLASTY;  Surgeon: Gearlean Alf, MD;  Location: [facility];  Service: Orthopedics;  Laterality: Left;   TOTAL KNEE ARTHROPLASTY Right 11/11/2017   Procedure: RIGHT TOTAL KNEE ARTHROPLASTY;  Surgeon: Gaynelle Arabian, MD;  Location[facility];  Service: Orthopedics;  Laterality: Right;  with abductor block   TUBAL LIGATION  1991   WISDOM TOOTH EXTRACTION  early 20's    Social History   Tobacco Use  Smoking Status Never  Smokeless Tobacco Never    Social History   Substance and Sexual Activity  Alcohol Use Yes   Alcohol/week: 1.0 standard drink of alcohol   Types: 1 Glasses of wine per week   Comment: social    Family History  Problem Relation Age of Onset   Heart disease Father    Heart attack Father    Hypertension Father    Heart disease Mother    Hypertension Mother    Asthma Brother    Cancer Maternal Grandmother        lymphoma   Cancer Maternal Grandfather        prostate cancer    Review of Systems: As noted in history of present illness. Positive for cough.  All other systems were reviewed and are negative.  Physical Exam: LMP 10/17/2010 (Approximate)  GENERAL:  Well appearing WF in NAD HEENT:  PERRL, EOMI, sclera are clear. Oropharynx is clear. NECK:  No jugular venous distention, carotid upstroke brisk and symmetric, no bruits, no thyromegaly or adenopathy LUNGS:  Clear to auscultation bilaterally CHEST:  Unremarkable HEART:  RRR,  PMI not displaced or sustained,S1 and S2 within normal limits, no S3, no S4: no clicks, no rubs, no murmurs ABD:  Soft, nontender. BS +, no masses or bruits. No hepatomegaly, no splenomegaly EXT:  2 + pulses throughout, no edema, no cyanosis no clubbing SKIN:  Warm and dry.  No rashes NEURO:  Alert and oriented x 3. Cranial nerves II through XII intact. PSYCH:   Cognitively intact   LABORATORY DATA:  Lab Results  Component Value Date   WBC 11.6 (H) 11/14/2017   HGB 12.2 11/14/2017   HCT 36.6 11/14/2017   PLT 320 11/14/2017   GLUCOSE 92 12/04/2021   CHOL 158 09/02/2018   TRIG 139 09/02/2018   HDL 59 09/02/2018   LDLCALC 71 09/02/2018   ALT 28 09/02/2018   AST 19 09/02/2018   NA 142 12/04/2021   K 4.2 12/04/2021   CL 100 12/04/2021   CREATININE 0.83 12/04/2021   BUN 17 12/04/2021  CO2 26 12/04/2021   INR 0.94 11/04/2017   Labs dated 05/01/17 reviewed: Normal  Chemistry panel and TSH. Cholesterol- 176, trig- 129, HDL- 61, LDL 89.  Dated 05/14/18: cholesterol 196, triglycerides 158, HDL 65, LDL 100. Chemistries and CBC normal. Dated 06/17/19: normal CBC and LFTs. Normal TSH Dated 08/27/19: cholesterol 165, triglycerides 153, HDL 60, LDL 75. Normal BMET Dated 03/30/20: cholesterol 161, triglycerides 168, HDL 64, LDL 69. CMET normal Dated 10/04/21: cholesterol 176, triglycerides 172, HDL 62, LDL 85. A1c 5.9%. CMET normal. CBC normal Dated 04/11/22: cholesterol 156, triglycerides 134, HDL 66, LDL 67. CMET, CBC, TSH normal Dated 10/11/22: A1c 5.9%   Ecg today shows NSR with rate 60. Normal. I have personally reviewed and interpreted this study.  Stress Echo 05/28/18: Study Conclusions   - Stress ECG conclusions: There were no stress arrhythmias or   conduction abnormalities. The stress ECG was negative for   ischemia. - Staged echo: There was no echocardiographic evidence for   stress-induced ischemia.   Impressions:   - Normal hyperdynamic response to exercise. Normal stress   echocardiogram. Good exrcise capacity. Normal BP response to   exercise.  Assessment / Plan:  1. Hypercholesterolemia. On Crestor 20 mg daily. LDL 85.  Encourage continued  lifestyle modifications.    2. Atypical chest pain of recent onset.   Negative pulmonary evaluation in the past. Normal stress Echo 2019. I have recommended a coronary CTA.   3. HTN. Well  controlled on current medication  4. Coronary calcification noted on CT 2016.

## 2023-01-31 ENCOUNTER — Ambulatory Visit: Payer: Medicare Other | Admitting: Cardiology

## 2023-03-19 ENCOUNTER — Telehealth: Payer: Self-pay | Admitting: Cardiology

## 2023-03-19 ENCOUNTER — Ambulatory Visit: Payer: Medicare Other | Attending: Cardiology

## 2023-03-19 ENCOUNTER — Other Ambulatory Visit: Payer: Self-pay

## 2023-03-19 DIAGNOSIS — R002 Palpitations: Secondary | ICD-10-CM

## 2023-03-19 NOTE — Telephone Encounter (Signed)
Called patient, advised of message from [place].   Ordered heart monitor 14 day zio- instructions sent via mychart.  Patient verbalized understanding

## 2023-03-19 NOTE — Progress Notes (Unsigned)
Enrolled for [employer] to mail a ZIO XT long term holter monitor to the patients address on file.  

## 2023-03-19 NOTE — Telephone Encounter (Signed)
  Patient c/o Palpitations:  High priority if patient c/o lightheadedness, shortness of breath, or chest pain  How long have you had palpitations/irregular HR/ Afib? Are you having the symptoms now? No   Are you currently experiencing lightheadedness, SOB or CP? None   Do you have a history of afib (atrial fibrillation) or irregular heart rhythm? No   Have you checked your BP or HR? (document readings if available): HR 138   Are you experiencing any other symptoms?   Pt said, last night while she is trying to sleep her heart started to beat really fast. Her fitbit notify her stating its possible afib and it lasted for an hour. She wanted to ask Dr. [place] if she needs to see her as soon as possible

## 2023-03-19 NOTE — Telephone Encounter (Signed)
Called patient, advised that last night she was laying down and her heart began to beat really fast, HR got up to 138 bpm. She states her watch mentioned AFIB she did not have any other symptoms with this episode (no shortness of breath or dizziness) This morning she feels fine, other than just tired. She states she wanted to know what Dr.Jordan recommends. She has never had this before. Patient does not have a history of afib- however I advised I would send a message to [place].   Do you want to do a zio?   Will route to MD.  Thanks!

## 2023-03-20 ENCOUNTER — Other Ambulatory Visit: Payer: Self-pay | Admitting: Cardiology

## 2023-03-24 DIAGNOSIS — R002 Palpitations: Secondary | ICD-10-CM | POA: Diagnosis not present

## 2023-04-09 ENCOUNTER — Encounter (HOSPITAL_BASED_OUTPATIENT_CLINIC_OR_DEPARTMENT_OTHER): Payer: Self-pay | Admitting: Obstetrics & Gynecology

## 2023-04-09 ENCOUNTER — Ambulatory Visit (INDEPENDENT_AMBULATORY_CARE_PROVIDER_SITE_OTHER): Payer: Medicare Other | Admitting: Obstetrics & Gynecology

## 2023-04-09 ENCOUNTER — Other Ambulatory Visit (HOSPITAL_COMMUNITY)
Admission: RE | Admit: 2023-04-09 | Discharge: 2023-04-09 | Disposition: A | Discharge status: Home / Self Care | Payer: Medicare Other | Source: Ambulatory Visit | Attending: Obstetrics & Gynecology | Admitting: Obstetrics & Gynecology

## 2023-04-09 VITALS — BP 150/67 | HR 75 | Ht 62.5 in | Wt 183.6 lb

## 2023-04-09 DIAGNOSIS — Z124 Encounter for screening for malignant neoplasm of cervix: Secondary | ICD-10-CM

## 2023-04-09 DIAGNOSIS — Z780 Asymptomatic menopausal state: Secondary | ICD-10-CM | POA: Diagnosis not present

## 2023-04-09 DIAGNOSIS — N6091 Unspecified benign mammary dysplasia of right breast: Secondary | ICD-10-CM | POA: Diagnosis not present

## 2023-04-09 DIAGNOSIS — M8580 Other specified disorders of bone density and structure, unspecified site: Secondary | ICD-10-CM

## 2023-04-09 DIAGNOSIS — Z01419 Encounter for gynecological examination (general) (routine) without abnormal findings: Secondary | ICD-10-CM

## 2023-04-09 NOTE — Progress Notes (Signed)
72 y.o. [id number] Married White or Caucasian female here for breast and pelvic exam.  I am also following her for h/o atypical lobular hyperplasia.  Had MMG and breast MRI in 09/2022 and 10/2022.  Denies vaginal bleeding.  Patient's last menstrual period was 10/17/2010 (approximate).          Sexually active: No.  H/O STD:  no  Health Maintenance: PCP:  Dr. Tracie Harrier.  Last wellness appt was 03/2022.  Did blood work at that appt:  yes.  Has appt scheduled for June. Vaccines are up to date:  has not done RSV vaccination Colonoscopy:  05/15/2011, pt reports has had one done since then MMG:  11/02/2022 Negative and breast MRI 10/2022.   BMD:  10/03/2022, mild osteopenia Last pap smear:  01/29/2020 Negative.   H/o abnormal pap smear:      reports that she has never smoked. She has never used smokeless tobacco. She reports current alcohol use of about 1.0 standard drink of alcohol per week. She reports that she does not use drugs.  Past Medical History:  Diagnosis Date   Arthritis    bilateral knees and hands   Asthma    allergy related, no attacks   Atypical chest pain 02/2014   april stress test=normal   Atypical lobular hyperplasia of breast 09/2002   right   Chronic kidney disease    GFR 58, mild   Edema    maxide prn   Environmental allergies    GERD (gastroesophageal reflux disease)    H/O measles    H/O mumps    Hypercholesterolemia    PONV (postoperative nausea and vomiting)     Past Surgical History:  Procedure Laterality Date   BREAST BIOPSY Right 09/2002   focal atypical lobular hyperplasia   BREAST SURGERY Right 10/03   focal atypia hyperplasia   DILATION AND CURETTAGE OF UTERUS     x2   FOOT SURGERY Right    Bunion   HYSTEROSCOPY WITH D & C  07/2007   polyp removed-benign   LIPOMA EXCISION Right 01/15/2019   Procedure: EXCISION RIGHT BACK LIPOMA;  Surgeon: Peggye Form, DO;  Location: [facility];  Service: Plastics;  Laterality: Right;    PATELLA-FEMORAL ARTHROPLASTY Left 11/23/2013   Procedure: LEFT KNEE PATELLA-FEMORAL ARTHROPLASTY;  Surgeon: Loanne Drilling, MD;  Location: [facility];  Service: Orthopedics;  Laterality: Left;   RIGHT OOPHORECTOMY Right 11/91   tubal ligation with complications   TOTAL KNEE ARTHROPLASTY Left 07/05/2014   Procedure: LEFT TOTAL KNEE ARTHROPLASTY;  Surgeon: Loanne Drilling, MD;  Location: [facility];  Service: Orthopedics;  Laterality: Left;   TOTAL KNEE ARTHROPLASTY Right 11/11/2017   Procedure: RIGHT TOTAL KNEE ARTHROPLASTY;  Surgeon: Ollen Gross, MD;  Location: [facility];  Service: Orthopedics;  Laterality: Right;  with abductor block   TUBAL LIGATION  1991   WISDOM TOOTH EXTRACTION  early 20's    Current Outpatient Medications  Medication Sig Dispense Refill   aspirin EC 81 MG tablet Take 81 mg by mouth daily.     Calcium Carbonate (CALCIUM 600 PO) Take by mouth.     chlorpheniramine (CHLOR-TRIMETON) 4 MG tablet Take 4 mg by mouth daily as needed for allergies.      cholecalciferol (VITAMIN D) 1000 units tablet Take 1,000 Units by mouth daily.     Coenzyme Q10 (CO Q 10 PO) Take 200 mg by mouth daily.     fexofenadine (ALLEGRA) 180 MG tablet Take 180 mg by  mouth daily.     losartan (COZAAR) 25 MG tablet      Melatonin 1 MG CAPS daily as needed.     Multiple Vitamins-Minerals (CENTRUM SILVER PO) Take by mouth.     omeprazole (PRILOSEC) 20 MG capsule Take 20 mg by mouth daily.     rosuvastatin (CRESTOR) 10 MG tablet Take 1 tablet (10 mg total) by mouth daily. Pt. Will need to make an appointment in order to receive future refills. 30 tablet 0   triamcinolone (NASACORT) 55 MCG/ACT AERO nasal inhaler Place 2 sprays into the nose daily.     triamterene-hydrochlorothiazide (MAXZIDE) 75-50 MG per tablet Take 1 tablet by mouth every morning.     vitamin C (ASCORBIC ACID) 500 MG tablet Take 500 mg by mouth daily.     vitamin E 400 UNIT capsule Take 400 Units by mouth daily.     No current  facility-administered medications for this visit.    Family History  Problem Relation Age of Onset   Heart disease Father    Heart attack Father    Hypertension Father    Heart disease Mother    Hypertension Mother    Asthma Brother    Cancer Maternal Grandmother        lymphoma   Cancer Maternal Grandfather        prostate cancer    Review of Systems  Constitutional: Negative.   Genitourinary: Negative.     Exam:   BP (!) 150/67 (BP Location: Left Arm, Patient Position: Sitting, Cuff Size: Large)   Pulse 75   Ht 5' 2.5" (1.588 m)   Wt 183 lb 9.6 oz (83.3 kg)   LMP 10/17/2010 (Approximate)   BMI 33.05 kg/m   Height: 5' 2.5" (158.8 cm)  General appearance: alert, cooperative and appears stated age Breasts: normal appearance, no masses or tenderness Abdomen: soft, non-tender; bowel sounds normal; no masses,  no organomegaly Lymph nodes: Cervical, supraclavicular, and axillary nodes normal.  No abnormal inguinal nodes palpated Neurologic: Grossly normal  Pelvic: External genitalia:  no lesions              Urethra:  normal appearing urethra with no masses, tenderness or lesions              Bartholins and Skenes: normal                 Vagina: normal appearing vagina with atrophic changes and no discharge, no lesions              Cervix: no lesions              Pap taken: No. Bimanual Exam:  Uterus:  normal size, contour, position, consistency, mobility, non-tender              Adnexa: normal adnexa               Rectovaginal: Confirms               Anus:  normal sphincter tone, no lesions  Chaperone, Ina Homes, CMA, was present for exam.  Assessment/Plan: 1. Encntr for gyn exam (general) (routine) w/o abn findings - Pap smear obtained today - Mammogram 10/23 - Colonoscopy 2012, pt reports having done since.  Release signed. - Bone mineral density 10/03/2022 with mild osteopenia - lab work done with PCP, Dr. Tracie Harrier - vaccines reviewed/updated  2. Cervical  cancer screening - Cytology - [facility]( [place] - PR OBTAINING SCREEN PAP SMEAR  3. Atypical lobular  hyperplasia [facility]) of right breast - s/p right lumpectomy  4. Postmenopausal - not on HRT  5. Osteopenia, unspecified location - plan to recheck 4-5 years from 2023 imaging - on Vit D and calcium, dosages reviewed

## 2023-04-12 LAB — CYTOLOGY - PAP: Diagnosis: NEGATIVE

## 2023-04-13 DIAGNOSIS — M8580 Other specified disorders of bone density and structure, unspecified site: Secondary | ICD-10-CM | POA: Insufficient documentation

## 2023-04-16 ENCOUNTER — Ambulatory Visit (HOSPITAL_BASED_OUTPATIENT_CLINIC_OR_DEPARTMENT_OTHER): Payer: Medicare Other | Admitting: Obstetrics & Gynecology

## 2023-04-19 ENCOUNTER — Other Ambulatory Visit: Payer: Self-pay | Admitting: Cardiology

## 2023-04-27 DIAGNOSIS — R002 Palpitations: Secondary | ICD-10-CM | POA: Diagnosis not present

## 2023-05-14 DIAGNOSIS — L821 Other seborrheic keratosis: Secondary | ICD-10-CM | POA: Diagnosis not present

## 2023-05-14 DIAGNOSIS — L814 Other melanin hyperpigmentation: Secondary | ICD-10-CM | POA: Diagnosis not present

## 2023-05-14 DIAGNOSIS — L910 Hypertrophic scar: Secondary | ICD-10-CM | POA: Diagnosis not present

## 2023-05-14 DIAGNOSIS — D225 Melanocytic nevi of trunk: Secondary | ICD-10-CM | POA: Diagnosis not present

## 2023-05-14 DIAGNOSIS — L570 Actinic keratosis: Secondary | ICD-10-CM | POA: Diagnosis not present

## 2023-05-15 ENCOUNTER — Ambulatory Visit (HOSPITAL_BASED_OUTPATIENT_CLINIC_OR_DEPARTMENT_OTHER): Payer: Medicare Other | Admitting: Obstetrics & Gynecology

## 2023-06-12 ENCOUNTER — Other Ambulatory Visit: Payer: Self-pay

## 2023-06-12 ENCOUNTER — Ambulatory Visit (HOSPITAL_COMMUNITY)
Admission: EM | Admit: 2023-06-12 | Discharge: 2023-06-12 | Disposition: A | Discharge status: Home / Self Care | Payer: Medicare Other | Attending: Emergency Medicine | Admitting: Emergency Medicine

## 2023-06-12 ENCOUNTER — Encounter (HOSPITAL_COMMUNITY): Payer: Self-pay | Admitting: *Deleted

## 2023-06-12 DIAGNOSIS — M542 Cervicalgia: Secondary | ICD-10-CM

## 2023-06-12 MED ORDER — KETOROLAC TROMETHAMINE 30 MG/ML IJ SOLN
30.0000 mg | Freq: Once | INTRAMUSCULAR | Status: AC
  Administered 2023-06-12: 30 mg via INTRAMUSCULAR

## 2023-06-12 MED ORDER — PREDNISONE 20 MG PO TABS: 40.0000 mg | ORAL_TABLET | Freq: Every day | ORAL | 0 refills | Status: DC

## 2023-06-12 MED ORDER — KETOROLAC TROMETHAMINE 30 MG/ML IJ SOLN
INTRAMUSCULAR | Status: AC
  Filled 2023-06-12: qty 1

## 2023-06-12 NOTE — ED Triage Notes (Signed)
C/O starting with posterior neck pain approx 2 wks ago; over past 2 days "feels like it's in the bones in the back of my head"; states "I can hardly move my head now'. Has tried [employer], heat, and saline spray. Denies dizziness, lightheadedness, vision changes. C/O slight nausea today.

## 2023-06-12 NOTE — Discharge Instructions (Signed)
Your pain is most likely caused by irritation to the muscles.  You have been given an vaginal delivery in the office to help reduce inflammation, does not start to see improvement in about 30 minutes send  Starting tomorrow take Prednisone in the morning with food for 5 days, may take Tylenol in addition to this as needed  You may use heating pad in 15 minute intervals as needed for additional comfort,  you may find comfort in using ice in 10-15 minutes over affected area  Begin stretching affected area daily for 10 minutes as tolerated to further loosen muscles   When lying down place pillow underneath and between knees for support  Can try sleeping without pillow on firm mattress   Practice good posture: head back, shoulders back, chest forward, pelvis back and weight distributed evenly on both legs  If pain persist after recommended treatment or reoccurs if may be beneficial to follow up with orthopedic specialist for evaluation, this doctor specializes in the bones and can manage your symptoms long-term with options such as but not limited to imaging, medications or physical therapy

## 2023-06-12 NOTE — ED Provider Notes (Signed)
[facility]    CSN: [id number] Arrival date & time: 06/12/23  0801      History   Chief Complaint Chief Complaint  Patient presents with   Neck Pain   Headache    HPI Diane Fox is a 72 y.o. female.   Patient presents for evaluation of posterior neck pain while living in 2 weeks today, progressively worsening.  Now experiencing she is so off-balance that she developed pain as well as neck and the base of the skull.  Has been a little bit limited range of motion, pain experience with all movement.  Has attempted use of Tylenol, and it is by saline spray which ineffective.  Denies injury or trauma.    Past Medical History:  Diagnosis Date   Arthritis    bilateral knees and hands   Asthma    allergy related, no attacks   Atypical chest pain 02/2014   april stress test=normal   Atypical lobular hyperplasia of breast 09/2002   right   Chronic kidney disease    GFR 58, mild   Edema    maxide prn   Environmental allergies    GERD (gastroesophageal reflux disease)    H/O measles    H/O mumps    Hypercholesterolemia    PONV (postoperative nausea and vomiting)     Patient Active Problem List   Diagnosis Date Noted   Osteopenia 04/13/2023   History of salpingoophorectomy 05/01/2021   Atypical lobular hyperplasia (ALH) of right breast 05/01/2021   Postmenopausal 05/01/2021   Lipoma of back 12/23/2018   Chronic kidney disease 09/26/2018   OA (osteoarthritis) of knee 11/23/2013   Chronic cough 01/23/2013   Hypercholesterolemia 11/05/2011    Past Surgical History:  Procedure Laterality Date   BREAST BIOPSY Right 09/2002   focal atypical lobular hyperplasia   BREAST SURGERY Right 10/03   focal atypia hyperplasia   DILATION AND CURETTAGE OF UTERUS     x2   FOOT SURGERY Right    Bunion   HYSTEROSCOPY WITH D & C  07/2007   polyp removed-benign   LIPOMA EXCISION Right 01/15/2019   Procedure: EXCISION RIGHT BACK LIPOMA;  Surgeon: Peggye Form,  DO;  Location: [facility];  Service: Plastics;  Laterality: Right;   PATELLA-FEMORAL ARTHROPLASTY Left 11/23/2013   Procedure: LEFT KNEE PATELLA-FEMORAL ARTHROPLASTY;  Surgeon: Loanne Drilling, MD;  Location: [facility];  Service: Orthopedics;  Laterality: Left;   RIGHT OOPHORECTOMY Right 11/91   tubal ligation with complications   TOTAL KNEE ARTHROPLASTY Left 07/05/2014   Procedure: LEFT TOTAL KNEE ARTHROPLASTY;  Surgeon: Loanne Drilling, MD;  Location: [facility];  Service: Orthopedics;  Laterality: Left;   TOTAL KNEE ARTHROPLASTY Right 11/11/2017   Procedure: RIGHT TOTAL KNEE ARTHROPLASTY;  Surgeon: Ollen Gross, MD;  Location: [facility];  Service: Orthopedics;  Laterality: Right;  with abductor block   TUBAL LIGATION  1991   WISDOM TOOTH EXTRACTION  early 20's    OB History     Gravida  4   Para  2   Term  2   Preterm  0   AB  2   Living  2      SAB  2   IAB  0   Ectopic  0   Multiple  0   Live Births  2            Home Medications    Prior to Admission medications   Medication Sig Start Date  End Date Taking? Authorizing Provider  aspirin EC 81 MG tablet Take 81 mg by mouth daily. 10/02/21  Yes [provider]  Calcium Carbonate (CALCIUM 600 PO) Take by mouth.   Yes [provider]  chlorpheniramine (CHLOR-TRIMETON) 4 MG tablet Take 4 mg by mouth daily as needed for allergies.    Yes [provider]  cholecalciferol (VITAMIN D) 1000 units tablet Take 1,000 Units by mouth daily.   Yes [provider]  Coenzyme Q10 (CO Q 10 PO) Take 200 mg by mouth daily.   Yes [provider]  fexofenadine (ALLEGRA) 180 MG tablet Take 180 mg by mouth daily.   Yes [provider]  losartan (COZAAR) 25 MG tablet  09/14/19  Yes [provider]  Multiple Vitamins-Minerals (CENTRUM SILVER PO) Take by mouth.   Yes [provider]  omeprazole (PRILOSEC) 20 MG capsule Take 20 mg by mouth daily.   Yes  [provider]  rosuvastatin (CRESTOR) 10 MG tablet Take 1 tablet (10 mg total) by mouth daily. Must keep scheduled appointment for future refills. Thank you. 04/19/23  Yes [place], Peter M, MD  triamcinolone (NASACORT) 55 MCG/ACT AERO nasal inhaler Place 2 sprays into the nose daily.   Yes [provider]  triamterene-hydrochlorothiazide (MAXZIDE) 75-50 MG per tablet Take 1 tablet by mouth every morning.   Yes [provider]  vitamin C (ASCORBIC ACID) 500 MG tablet Take 500 mg by mouth daily.   Yes [provider]  vitamin E 400 UNIT capsule Take 400 Units by mouth daily.   Yes [provider]  Melatonin 1 MG CAPS daily as needed.    [provider]    Family History Family History  Problem Relation Age of Onset   Heart disease Father    Heart attack Father    Hypertension Father    Heart disease Mother    Hypertension Mother    Asthma Brother    Cancer Maternal Grandmother        lymphoma   Cancer Maternal Grandfather        prostate cancer    Social History Social History   Tobacco Use   Smoking status: Never   Smokeless tobacco: Never  Vaping Use   Vaping Use: Never used  Substance Use Topics   Alcohol use: Not Currently    Comment: socially   Drug use: No     Allergies   Biaxin [clarithromycin], Penicillins, Sulfa antibiotics, and Zostavax [zoster vaccine live]   Review of Systems Review of Systems  Constitutional: Negative.   HENT: Negative.    Respiratory: Negative.    Cardiovascular: Negative.   Musculoskeletal:  Positive for neck pain. Negative for arthralgias, back pain, gait problem, joint swelling, myalgias and neck stiffness.  Neurological:  Positive for headaches. Negative for dizziness, tremors, seizures, syncope, facial asymmetry, speech difficulty, weakness, light-headedness and numbness.     Physical Exam Triage Vital Signs ED Triage Vitals  Enc Vitals Group     BP 06/12/23 0816 (!) 146/84      Pulse Rate 06/12/23 0816 65     Resp 06/12/23 0816 16     Temp 06/12/23 0816 (!) 97.4 F (36.3 C)     Temp Source 06/12/23 0816 Oral     SpO2 06/12/23 0816 95 %     Weight --      Height --      Head Circumference --      Peak Flow --  Pain Score 06/12/23 0818 2     Pain Loc --      Pain Edu? --      Excl. in GC? --    No data found.  Updated Vital Signs BP (!) 146/84   Pulse 65   Temp (!) 97.4 F (36.3 C) (Oral)   Resp 16   LMP 10/17/2010 (Approximate)   SpO2 95%   Visual Acuity Right Eye Distance:   Left Eye Distance:   Bilateral Distance:    Right Eye Near:   Left Eye Near:    Bilateral Near:     Physical Exam Constitutional:      Appearance: Normal appearance.  Eyes:     Extraocular Movements: Extraocular movements intact.  Neck:     Comments: Unable to reproduce tenderness on exam, no ecchymosis, swelling or deformity, no rigidity or crepitus, limited range of motion with lateral rotation Pulmonary:     Effort: Pulmonary effort is normal.  Neurological:     Mental Status: She is alert and oriented to person, place, and time. Mental status is at baseline.      UC Treatments / Results  Labs (all labs ordered are listed, but only abnormal results are displayed) Labs Reviewed - No data to display  EKG   Radiology No results found.  Procedures Procedures (including critical care time)  Medications Ordered in UC Medications - No data to display  Initial Impression / Assessment and Plan / UC Course  I have reviewed the triage vital signs and the nursing notes.  Pertinent labs & imaging results that were available during my care of the patient were reviewed by me and considered in my medical decision making (see chart for details).  Neck pain  Etiology most likely muscular, low suspicion for spinal involvement therefore imaging deferred, discussed this with patient, Toradol injection given prednisone prescribed for outpatient use,  recommended RICE heat massage stretching and activity as tolerated with follow-up with PCP if symptoms persist or worsen  Final Clinical Impressions(s) / UC Diagnoses   Final diagnoses:  None   Discharge Instructions   None    ED Prescriptions   None    PDMP not reviewed this encounter.   Valinda Hoar, [place] 06/12/23 [phone]

## 2023-06-21 NOTE — Progress Notes (Unsigned)
Diane Fox Date of Birth: 02-15-1951 Medical Record #[id number]  History of Present Illness: Seen today for followup hypercholesterolemia and coronary calcification. She has a history of hypercholesterolemia and is on Crestor therapy.  She had a normal stress Echo in April 2015. 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   In 2019 she complained of DOE. She has a chronic cough with extensive pulmonary evaluation before. Stress Echo in June 2019 was normal. Prior CT chest in 2016 was normal except for some coronary calcification. She had coronary CTA in Dec 2022 showing a calcium score of 64 and mild nonobstructive CAD. In April this year she complained of palpitations. Event monitor showed some short bursts of PAT.   On follow up today she is staying  active. She walks daily. She does report over the past month that she has an ache in her heart. No real pain. May last most of the day. Not really associated with anything. Has had some recent stressors.  Current Outpatient Medications on File Prior to Visit  Medication Sig Dispense Refill   aspirin EC 81 MG tablet Take 81 mg by mouth daily.     Calcium Carbonate (CALCIUM 600 PO) Take by mouth.     chlorpheniramine (CHLOR-TRIMETON) 4 MG tablet Take 4 mg by mouth daily as needed for allergies.      cholecalciferol (VITAMIN D) 1000 units tablet Take 1,000 Units by mouth daily.     Coenzyme Q10 (CO Q 10 PO) Take 200 mg by mouth daily.     fexofenadine (ALLEGRA) 180 MG tablet Take 180 mg by mouth daily.     losartan (COZAAR) 25 MG tablet      Melatonin 1 MG CAPS daily as needed.     Multiple Vitamins-Minerals (CENTRUM SILVER PO) Take by mouth.     omeprazole (PRILOSEC) 20 MG capsule Take 20 mg by mouth daily.     predniSONE (DELTASONE) 20 MG tablet Take 2 tablets (40 mg total) by mouth daily. 10 tablet 0   rosuvastatin (CRESTOR) 10 MG tablet Take 1 tablet (10 mg total) by mouth daily. Must keep scheduled appointment for future refills.  Thank you. 30 tablet 2   triamcinolone (NASACORT) 55 MCG/ACT AERO nasal inhaler Place 2 sprays into the nose daily.     triamterene-hydrochlorothiazide (MAXZIDE) 75-50 MG per tablet Take 1 tablet by mouth every morning.     vitamin C (ASCORBIC ACID) 500 MG tablet Take 500 mg by mouth daily.     vitamin E 400 UNIT capsule Take 400 Units by mouth daily.     No current facility-administered medications on file prior to visit.    Allergies  Allergen Reactions   Biaxin [Clarithromycin] Hives   Penicillins Hives    Has patient had a PCN reaction causing immediate rash, facial/tongue/throat swelling, SOB or lightheadedness with hypotension: No Has patient had a PCN reaction causing severe rash involving mucus membranes or skin necrosis: No Has patient had a PCN reaction that required hospitalization: No Has patient had a PCN reaction occurring within the last 10 years: Yes If all of the above answers are "NO", then may proceed with Cephalosporin use.    Sulfa Antibiotics Hives   Zostavax [Zoster Vaccine Live] Hives    Past Medical History:  Diagnosis Date   Arthritis    bilateral knees and hands   Asthma    allergy related, no attacks   Atypical chest pain 02/2014   april stress test=normal   Atypical lobular  hyperplasia of breast 09/2002   right   Chronic kidney disease    GFR 58, mild   Edema    maxide prn   Environmental allergies    GERD (gastroesophageal reflux disease)    H/O measles    H/O mumps    Hypercholesterolemia    PONV (postoperative nausea and vomiting)     Past Surgical History:  Procedure Laterality Date   BREAST BIOPSY Right 09/2002   focal atypical lobular hyperplasia   BREAST SURGERY Right 10/03   focal atypia hyperplasia   DILATION AND CURETTAGE OF UTERUS     x2   FOOT SURGERY Right    Bunion   HYSTEROSCOPY WITH D & C  07/2007   polyp removed-benign   LIPOMA EXCISION Right 01/15/2019   Procedure: EXCISION RIGHT BACK LIPOMA;  Surgeon: Peggye Form, DO;  Location: [facility];  Service: Plastics;  Laterality: Right;   PATELLA-FEMORAL ARTHROPLASTY Left 11/23/2013   Procedure: LEFT KNEE PATELLA-FEMORAL ARTHROPLASTY;  Surgeon: Loanne Drilling, MD;  Location: [facility];  Service: Orthopedics;  Laterality: Left;   RIGHT OOPHORECTOMY Right 11/91   tubal ligation with complications   TOTAL KNEE ARTHROPLASTY Left 07/05/2014   Procedure: LEFT TOTAL KNEE ARTHROPLASTY;  Surgeon: Loanne Drilling, MD;  Location: [facility];  Service: Orthopedics;  Laterality: Left;   TOTAL KNEE ARTHROPLASTY Right 11/11/2017   Procedure: RIGHT TOTAL KNEE ARTHROPLASTY;  Surgeon: Ollen Gross, MD;  Location: [facility];  Service: Orthopedics;  Laterality: Right;  with abductor block   TUBAL LIGATION  1991   WISDOM TOOTH EXTRACTION  early 20's    Social History   Tobacco Use  Smoking Status Never  Smokeless Tobacco Never    Social History   Substance and Sexual Activity  Alcohol Use Not Currently   Comment: socially    Family History  Problem Relation Age of Onset   Heart disease Father    Heart attack Father    Hypertension Father    Heart disease Mother    Hypertension Mother    Asthma Brother    Cancer Maternal Grandmother        lymphoma   Cancer Maternal Grandfather        prostate cancer    Review of Systems: As noted in history of present illness. Positive for cough.  All other systems were reviewed and are negative.  Physical Exam: LMP 10/17/2010 (Approximate)  GENERAL:  Well appearing WF in NAD HEENT:  PERRL, EOMI, sclera are clear. Oropharynx is clear. NECK:  No jugular venous distention, carotid upstroke brisk and symmetric, no bruits, no thyromegaly or adenopathy LUNGS:  Clear to auscultation bilaterally CHEST:  Unremarkable HEART:  RRR,  PMI not displaced or sustained,S1 and S2 within normal limits, no S3, no S4: no clicks, no rubs, no murmurs ABD:  Soft, nontender. BS +, no masses or bruits. No hepatomegaly, no  splenomegaly EXT:  2 + pulses throughout, no edema, no cyanosis no clubbing SKIN:  Warm and dry.  No rashes NEURO:  Alert and oriented x 3. Cranial nerves II through XII intact. PSYCH:  Cognitively intact   LABORATORY DATA:  Lab Results  Component Value Date   WBC 11.6 (H) 11/14/2017   HGB 12.2 11/14/2017   HCT 36.6 11/14/2017   PLT 320 11/14/2017   GLUCOSE 92 12/04/2021   CHOL 158 09/02/2018   TRIG 139 09/02/2018   HDL 59 09/02/2018   LDLCALC 71 09/02/2018   ALT 28 09/02/2018  AST 19 09/02/2018   NA 142 12/04/2021   K 4.2 12/04/2021   CL 100 12/04/2021   CREATININE 0.83 12/04/2021   BUN 17 12/04/2021   CO2 26 12/04/2021   INR 0.94 11/04/2017   Labs dated 05/01/17 reviewed: Normal  Chemistry panel and TSH. Cholesterol- 176, trig- 129, HDL- 61, LDL 89.  Dated 05/14/18: cholesterol 196, triglycerides 158, HDL 65, LDL 100. Chemistries and CBC normal. Dated 06/17/19: normal CBC and LFTs. Normal TSH Dated 08/27/19: cholesterol 165, triglycerides 153, HDL 60, LDL 75. Normal BMET Dated 03/30/20: cholesterol 161, triglycerides 168, HDL 64, LDL 69. CMET normal Dated 10/04/21: cholesterol 176, triglycerides 172, HDL 62, LDL 85. A1c 5.9%. CMET normal. CBC normal Dated 10/11/22: A1c 5.9%.  Dated 04/11/22: cholesterol 156, triglycerides 124, HDL 66, LDL 67. CBC, CMET and TSH normal.    Ecg today shows NSR with rate 60. Normal. I have personally reviewed and interpreted this study.  Stress Echo 05/28/18: Study Conclusions   - Stress ECG conclusions: There were no stress arrhythmias or   conduction abnormalities. The stress ECG was negative for   ischemia. - Staged echo: There was no echocardiographic evidence for   stress-induced ischemia.   Impressions:   - Normal hyperdynamic response to exercise. Normal stress   echocardiogram. Good exrcise capacity. Normal BP response to   exercise.  Coronary CTA 12/12/21:  CLINICAL DATA:  30F with coronary calcification, hyperlipidemia,  and chest pain.   EXAM: Cardiac/Coronary  CT   TECHNIQUE: The patient was scanned on a [employer].   FINDINGS: A 120 kV prospective scan was triggered in the descending thoracic aorta at 111 HU's. Axial non-contrast 3 mm slices were carried out through the heart. The data set was analyzed on a dedicated work station and scored using the Agatson method. Gantry rotation speed was 250 msecs and collimation was .6 mm. No beta blockade and 0.8 mg of sl NTG was given. The 3D data set was reconstructed in 5% intervals of the 67-82 % of the R-R cycle. Diastolic phases were analyzed on a dedicated work station using MPR, MIP and VRT modes. The patient received 80 cc of contrast.   Aorta: Normal size. Mild calcification of the aortic root and descending aorta. No dissection.   Aortic Valve:  Trileaflet.  No calcifications.   Coronary Arteries:  Normal coronary origin.  Right dominance.   RCA is a large dominant artery that gives rise to PDA and PLVB. There is no plaque.   Left main is a large artery that gives rise to LAD and LCX arteries. There is minimal (<25%) calcified plaque.   LAD is a large vessel that has minimal (<25%) calcified plaque proximally. D1 and D2 have no plaque.   LCX is a non-dominant artery that gives rise to two OM branches. There is no plaque.   Coronary Calcium Score:   Left main: 0   Left anterior descending artery: 64.4   Left circumflex artery: 0   Right coronary artery: 0   Total: 64.4   Percentile: 64th   Other findings:   Normal pulmonary vein drainage into the left atrium.   Normal let atrial appendage without a thrombus.   Normal size of the pulmonary artery.   IMPRESSION: 1. Coronary calcium score of 64.4. This was 64th percentile for age-, race-, and sex-matched controls.   2. Normal coronary origin with right dominance.   3.  Minimal (<25%) LM and LAD calcified plaque.  CAD-RADS 1.   4.  Minimal calcification  of the aorta.   Chilton Si, MD     Electronically Signed   By: Chilton Si M.D.   On: 12/14/2021 14:32    Addended by Chilton Si, MD on 12/14/2021  2:34 PM     Event monitor 04/29/23: Study Highlights      Normal sinus rhythm   Rate PACs   10 short runs of SVT/PAT longest lasting 19 beats   Symptoms appear to correlate with PACs and SVT     Patch Wear Time:  14 days and 19 hours (2024-04-07T13:19:37-0400 to 2024-05-02T06:51:07-399)   Monitor 1 Patient had a min HR of 51 bpm, max HR of 131 bpm, and avg HR of 74 bpm. Predominant underlying rhythm was Sinus Rhythm. Isolated SVEs were rare (<1.0%), and no SVE Couplets or SVE Triplets were present. Isolated VEs were rare (<1.0%), and no VE Couplets  or VE Triplets were present.    Monitor 2 Patient had a min HR of 48 bpm, max HR of 184 bpm, and avg HR of 77 bpm. Predominant underlying rhythm was Sinus Rhythm. 10 Supraventricular Tachycardia runs occurred, the run with the fastest interval lasting 7 beats with a max rate of 184 bpm, the  longest lasting 19 beats with an avg rate of 120 bpm. Supraventricular Tachycardia was detected within +/- 45 seconds of symptomatic patient event(s). Isolated SVEs were rare (<1.0%), SVE Couplets were rare (<1.0%), and SVE Triplets were rare (<1.0%).  Isolated VEs were rare (<1.0%, 114), VE Couplets were rare (<1.0%, 3), and VE Triplets were rare (<1.0%, 2).         Assessment / Plan:  1. Hypercholesterolemia. On Crestor 20 mg daily. LDL 85.  Encourage continued  lifestyle modifications.    2. Atypical chest pain of recent onset.   Negative pulmonary evaluation in the past. Normal stress Echo 2019. I have recommended a coronary CTA.   3. HTN. Well controlled on current medication  4. Coronary calcification noted on CT 2016.

## 2023-06-25 ENCOUNTER — Encounter: Payer: Self-pay | Admitting: Cardiology

## 2023-06-25 ENCOUNTER — Ambulatory Visit: Payer: Medicare Other | Admitting: Cardiology

## 2023-06-25 VITALS — BP 128/70 | HR 83 | Ht 63.0 in | Wt 179.2 lb

## 2023-06-25 DIAGNOSIS — I2584 Coronary atherosclerosis due to calcified coronary lesion: Secondary | ICD-10-CM | POA: Diagnosis not present

## 2023-06-25 DIAGNOSIS — E7800 Pure hypercholesterolemia, unspecified: Secondary | ICD-10-CM | POA: Diagnosis not present

## 2023-06-25 DIAGNOSIS — I2510 Atherosclerotic heart disease of native coronary artery without angina pectoris: Secondary | ICD-10-CM | POA: Diagnosis not present

## 2023-06-25 DIAGNOSIS — R002 Palpitations: Secondary | ICD-10-CM | POA: Diagnosis not present

## 2023-06-25 NOTE — Patient Instructions (Signed)
Medication Instructions:  Continue same medications *If you need a refill on your cardiac medications before your next appointment, please call your pharmacy*   Lab Work: None ordered   Testing/Procedures: None ordered   Follow-Up: At [facility], you and your health needs are our priority.  As part of our continuing mission to provide you with exceptional heart care, we have created designated Provider Care Teams.  These Care Teams include your primary Cardiologist (physician) and Advanced Practice Providers (APPs -  Physician Assistants and Nurse Practitioners) who all work together to provide you with the care you need, when you need it.  We recommend signing up for the patient portal called "MyChart".  Sign up information is provided on this After Visit Summary.  MyChart is used to connect with patients for Virtual Visits (Telemedicine).  Patients are able to view lab/test results, encounter notes, upcoming appointments, etc.  Non-urgent messages can be sent to your provider as well.   To learn more about what you can do with MyChart, go to [email or web address].    Your next appointment:  1 year   Call in March to schedule July appointment     Provider:  Dr.Jordan   

## 2023-08-13 ENCOUNTER — Ambulatory Visit: Payer: Medicare Other | Admitting: Cardiology

## 2023-08-20 ENCOUNTER — Other Ambulatory Visit: Payer: Self-pay | Admitting: Family Medicine

## 2023-08-20 DIAGNOSIS — Z1231 Encounter for screening mammogram for malignant neoplasm of breast: Secondary | ICD-10-CM

## 2023-08-21 DIAGNOSIS — Z79899 Other long term (current) drug therapy: Secondary | ICD-10-CM | POA: Diagnosis not present

## 2023-08-21 DIAGNOSIS — E782 Mixed hyperlipidemia: Secondary | ICD-10-CM | POA: Diagnosis not present

## 2023-08-21 DIAGNOSIS — Z0000 Encounter for general adult medical examination without abnormal findings: Secondary | ICD-10-CM | POA: Diagnosis not present

## 2023-08-21 DIAGNOSIS — R7303 Prediabetes: Secondary | ICD-10-CM | POA: Diagnosis not present

## 2023-08-21 DIAGNOSIS — I10 Essential (primary) hypertension: Secondary | ICD-10-CM | POA: Diagnosis not present

## 2023-08-21 DIAGNOSIS — K219 Gastro-esophageal reflux disease without esophagitis: Secondary | ICD-10-CM | POA: Diagnosis not present

## 2023-08-21 DIAGNOSIS — E559 Vitamin D deficiency, unspecified: Secondary | ICD-10-CM | POA: Diagnosis not present

## 2023-08-21 DIAGNOSIS — I700 Atherosclerosis of aorta: Secondary | ICD-10-CM | POA: Diagnosis not present

## 2023-08-22 ENCOUNTER — Encounter: Payer: Self-pay | Admitting: Family Medicine

## 2023-09-07 ENCOUNTER — Other Ambulatory Visit: Payer: Self-pay | Admitting: Cardiology

## 2023-09-16 DIAGNOSIS — Z23 Encounter for immunization: Secondary | ICD-10-CM | POA: Diagnosis not present

## 2023-10-07 ENCOUNTER — Ambulatory Visit
Admission: RE | Admit: 2023-10-07 | Discharge: 2023-10-07 | Disposition: A | Discharge status: Home / Self Care | Payer: Medicare Other | Source: Ambulatory Visit | Attending: Family Medicine | Admitting: Family Medicine

## 2023-10-07 DIAGNOSIS — Z1231 Encounter for screening mammogram for malignant neoplasm of breast: Secondary | ICD-10-CM | POA: Diagnosis not present

## 2023-11-06 DIAGNOSIS — H2513 Age-related nuclear cataract, bilateral: Secondary | ICD-10-CM | POA: Diagnosis not present

## 2024-02-18 DIAGNOSIS — Z6833 Body mass index (BMI) 33.0-33.9, adult: Secondary | ICD-10-CM | POA: Diagnosis not present

## 2024-02-18 DIAGNOSIS — M8580 Other specified disorders of bone density and structure, unspecified site: Secondary | ICD-10-CM | POA: Diagnosis not present

## 2024-02-18 DIAGNOSIS — K219 Gastro-esophageal reflux disease without esophagitis: Secondary | ICD-10-CM | POA: Diagnosis not present

## 2024-02-18 DIAGNOSIS — E66811 Obesity, class 1: Secondary | ICD-10-CM | POA: Diagnosis not present

## 2024-02-18 DIAGNOSIS — R7303 Prediabetes: Secondary | ICD-10-CM | POA: Diagnosis not present

## 2024-02-18 DIAGNOSIS — E782 Mixed hyperlipidemia: Secondary | ICD-10-CM | POA: Diagnosis not present

## 2024-02-18 DIAGNOSIS — I10 Essential (primary) hypertension: Secondary | ICD-10-CM | POA: Diagnosis not present

## 2024-02-18 DIAGNOSIS — I700 Atherosclerosis of aorta: Secondary | ICD-10-CM | POA: Diagnosis not present

## 2024-02-20 ENCOUNTER — Other Ambulatory Visit: Payer: Self-pay | Admitting: Family Medicine

## 2024-02-20 DIAGNOSIS — M8580 Other specified disorders of bone density and structure, unspecified site: Secondary | ICD-10-CM

## 2024-03-09 DIAGNOSIS — I10 Essential (primary) hypertension: Secondary | ICD-10-CM | POA: Diagnosis not present

## 2024-03-09 DIAGNOSIS — I700 Atherosclerosis of aorta: Secondary | ICD-10-CM | POA: Diagnosis not present

## 2024-04-07 DIAGNOSIS — I700 Atherosclerosis of aorta: Secondary | ICD-10-CM | POA: Diagnosis not present

## 2024-04-07 DIAGNOSIS — I10 Essential (primary) hypertension: Secondary | ICD-10-CM | POA: Diagnosis not present

## 2024-04-15 DIAGNOSIS — E782 Mixed hyperlipidemia: Secondary | ICD-10-CM | POA: Diagnosis not present

## 2024-04-15 DIAGNOSIS — I10 Essential (primary) hypertension: Secondary | ICD-10-CM | POA: Diagnosis not present

## 2024-04-15 DIAGNOSIS — I700 Atherosclerosis of aorta: Secondary | ICD-10-CM | POA: Diagnosis not present

## 2024-05-07 DIAGNOSIS — I10 Essential (primary) hypertension: Secondary | ICD-10-CM | POA: Diagnosis not present

## 2024-05-07 DIAGNOSIS — I700 Atherosclerosis of aorta: Secondary | ICD-10-CM | POA: Diagnosis not present

## 2024-05-16 DIAGNOSIS — I10 Essential (primary) hypertension: Secondary | ICD-10-CM | POA: Diagnosis not present

## 2024-05-16 DIAGNOSIS — I700 Atherosclerosis of aorta: Secondary | ICD-10-CM | POA: Diagnosis not present

## 2024-05-16 DIAGNOSIS — E782 Mixed hyperlipidemia: Secondary | ICD-10-CM | POA: Diagnosis not present

## 2024-05-19 DIAGNOSIS — D692 Other nonthrombocytopenic purpura: Secondary | ICD-10-CM | POA: Diagnosis not present

## 2024-05-19 DIAGNOSIS — D2271 Melanocytic nevi of right lower limb, including hip: Secondary | ICD-10-CM | POA: Diagnosis not present

## 2024-05-19 DIAGNOSIS — D2272 Melanocytic nevi of left lower limb, including hip: Secondary | ICD-10-CM | POA: Diagnosis not present

## 2024-05-19 DIAGNOSIS — D225 Melanocytic nevi of trunk: Secondary | ICD-10-CM | POA: Diagnosis not present

## 2024-05-19 DIAGNOSIS — D2262 Melanocytic nevi of left upper limb, including shoulder: Secondary | ICD-10-CM | POA: Diagnosis not present

## 2024-05-19 DIAGNOSIS — L814 Other melanin hyperpigmentation: Secondary | ICD-10-CM | POA: Diagnosis not present

## 2024-05-19 DIAGNOSIS — L821 Other seborrheic keratosis: Secondary | ICD-10-CM | POA: Diagnosis not present

## 2024-05-19 DIAGNOSIS — D2261 Melanocytic nevi of right upper limb, including shoulder: Secondary | ICD-10-CM | POA: Diagnosis not present

## 2024-05-19 DIAGNOSIS — L570 Actinic keratosis: Secondary | ICD-10-CM | POA: Diagnosis not present

## 2024-06-06 DIAGNOSIS — I700 Atherosclerosis of aorta: Secondary | ICD-10-CM | POA: Diagnosis not present

## 2024-06-06 DIAGNOSIS — I10 Essential (primary) hypertension: Secondary | ICD-10-CM | POA: Diagnosis not present

## 2024-06-15 DIAGNOSIS — I700 Atherosclerosis of aorta: Secondary | ICD-10-CM | POA: Diagnosis not present

## 2024-06-15 DIAGNOSIS — I10 Essential (primary) hypertension: Secondary | ICD-10-CM | POA: Diagnosis not present

## 2024-06-15 DIAGNOSIS — E782 Mixed hyperlipidemia: Secondary | ICD-10-CM | POA: Diagnosis not present

## 2024-07-06 DIAGNOSIS — I700 Atherosclerosis of aorta: Secondary | ICD-10-CM | POA: Diagnosis not present

## 2024-07-06 DIAGNOSIS — I10 Essential (primary) hypertension: Secondary | ICD-10-CM | POA: Diagnosis not present

## 2024-07-16 DIAGNOSIS — I10 Essential (primary) hypertension: Secondary | ICD-10-CM | POA: Diagnosis not present

## 2024-07-16 DIAGNOSIS — E782 Mixed hyperlipidemia: Secondary | ICD-10-CM | POA: Diagnosis not present

## 2024-07-16 DIAGNOSIS — I700 Atherosclerosis of aorta: Secondary | ICD-10-CM | POA: Diagnosis not present

## 2024-07-27 ENCOUNTER — Emergency Department (HOSPITAL_BASED_OUTPATIENT_CLINIC_OR_DEPARTMENT_OTHER)
Admission: EM | Admit: 2024-07-27 | Discharge: 2024-07-27 | Disposition: A | Discharge status: Home / Self Care | Attending: Emergency Medicine | Admitting: Emergency Medicine

## 2024-07-27 ENCOUNTER — Emergency Department (HOSPITAL_BASED_OUTPATIENT_CLINIC_OR_DEPARTMENT_OTHER)

## 2024-07-27 ENCOUNTER — Other Ambulatory Visit: Payer: Self-pay

## 2024-07-27 ENCOUNTER — Encounter (HOSPITAL_BASED_OUTPATIENT_CLINIC_OR_DEPARTMENT_OTHER): Payer: Self-pay

## 2024-07-27 DIAGNOSIS — N189 Chronic kidney disease, unspecified: Secondary | ICD-10-CM | POA: Diagnosis not present

## 2024-07-27 DIAGNOSIS — Y92009 Unspecified place in unspecified non-institutional (private) residence as the place of occurrence of the external cause: Secondary | ICD-10-CM | POA: Insufficient documentation

## 2024-07-27 DIAGNOSIS — S199XXA Unspecified injury of neck, initial encounter: Secondary | ICD-10-CM | POA: Diagnosis not present

## 2024-07-27 DIAGNOSIS — I129 Hypertensive chronic kidney disease with stage 1 through stage 4 chronic kidney disease, or unspecified chronic kidney disease: Secondary | ICD-10-CM | POA: Diagnosis not present

## 2024-07-27 DIAGNOSIS — S0990XA Unspecified injury of head, initial encounter: Secondary | ICD-10-CM | POA: Diagnosis not present

## 2024-07-27 DIAGNOSIS — J45909 Unspecified asthma, uncomplicated: Secondary | ICD-10-CM | POA: Diagnosis not present

## 2024-07-27 DIAGNOSIS — Z7982 Long term (current) use of aspirin: Secondary | ICD-10-CM | POA: Insufficient documentation

## 2024-07-27 DIAGNOSIS — S0093XA Contusion of unspecified part of head, initial encounter: Secondary | ICD-10-CM | POA: Diagnosis not present

## 2024-07-27 DIAGNOSIS — R220 Localized swelling, mass and lump, head: Secondary | ICD-10-CM | POA: Insufficient documentation

## 2024-07-27 DIAGNOSIS — Y9301 Activity, walking, marching and hiking: Secondary | ICD-10-CM | POA: Insufficient documentation

## 2024-07-27 DIAGNOSIS — S80212A Abrasion, left knee, initial encounter: Secondary | ICD-10-CM | POA: Diagnosis not present

## 2024-07-27 DIAGNOSIS — W01198A Fall on same level from slipping, tripping and stumbling with subsequent striking against other object, initial encounter: Secondary | ICD-10-CM | POA: Insufficient documentation

## 2024-07-27 DIAGNOSIS — Z79899 Other long term (current) drug therapy: Secondary | ICD-10-CM | POA: Insufficient documentation

## 2024-07-27 DIAGNOSIS — S40021A Contusion of right upper arm, initial encounter: Secondary | ICD-10-CM | POA: Diagnosis not present

## 2024-07-27 DIAGNOSIS — W19XXXA Unspecified fall, initial encounter: Secondary | ICD-10-CM

## 2024-07-27 NOTE — ED Notes (Signed)
 Pt alert and oriented X 4 at the time of discharge. RR even and unlabored. No acute distress noted. Pt verbalized understanding of discharge instructions as discussed. Pt ambulatory to lobby at time of discharge.

## 2024-07-27 NOTE — Discharge Instructions (Addendum)
 Your CT scans were normal from the trauma but did show some degenerative disc disease as well as narrowing in certain aspects of your cervical spine. Recommend follow up with your PCP for re-evaluation.  Please do not hesitate to return if the worrisome signs and symptoms discussed become apparent.

## 2024-07-27 NOTE — ED Triage Notes (Signed)
 Fall yesterday morning. Hematoma to left side of her head, bruising posterior right arm. Feels out of sorts. Takes ASA 81 mg daily. Negative LOC.

## 2024-07-27 NOTE — ED Provider Notes (Signed)
 [place] EMERGENCY DEPARTMENT AT [facility] Provider Note   CSN: [id number] Arrival date & time: 07/27/24  1156     Patient presents with: Diane Fox   Diane Fox is a 73 y.o. female.    Fall   73 year old female presents emergency department complaints of fall.  Fall occurred yesterday morning.  Was walking in her house when she tripped on a Cherrelle leg and fell on the ground.  States that she did hit her head on a nearby object as well as the back of her right arm and the front of her left knee.  Takes the daily aspirin but without blood thinner otherwise.  Denies any LOC, visual streams, gait abnormality, slurred speech, facial droop or weakness/sensory deficits in upper extremities.  Denies any chest pain, abdominal pain, nausea, vomiting.  Has been ambulating at baseline independently.  Past medical history significant for arthritis, CKD, asthma, GERD, hypercholesterolemia  Prior to Admission medications   Medication Sig Start Date End Date Taking? Authorizing Provider  aspirin EC 81 MG tablet Take 81 mg by mouth daily. 10/02/21   [provider]  Calcium  Carbonate (CALCIUM  600 PO) Take by mouth.    [provider]  chlorpheniramine (CHLOR-TRIMETON) 4 MG tablet Take 4 mg by mouth daily as needed for allergies.     [provider]  cholecalciferol (VITAMIN D ) 1000 units tablet Take 1,000 Units by mouth daily.    [provider]  Coenzyme Q10 (CO Q 10 PO) Take 200 mg by mouth daily.    [provider]  fexofenadine (ALLEGRA) 180 MG tablet Take 180 mg by mouth daily.    [provider]  losartan (COZAAR) 25 MG tablet  09/14/19   [provider]  Melatonin 1 MG CAPS daily as needed.    [provider]  Multiple Vitamins-Minerals (CENTRUM SILVER PO) Take by mouth.    [provider]  omeprazole (PRILOSEC) 20 MG capsule Take 20 mg by mouth daily.    [provider]  rosuvastatin   (CRESTOR ) 10 MG tablet TAKE 1 TABLET BY MOUTH DAILY 09/09/23   [place], Peter M, MD  triamcinolone  (NASACORT ) 55 MCG/ACT AERO nasal inhaler Place 2 sprays into the nose daily.    [provider]  triamterene -hydrochlorothiazide  (MAXZIDE ) 75-50 MG per tablet Take 1 tablet by mouth every morning.    [provider]  vitamin C (ASCORBIC ACID) 500 MG tablet Take 500 mg by mouth daily.    [provider]  vitamin E 400 UNIT capsule Take 400 Units by mouth daily.    [provider]    Allergies: Biaxin [clarithromycin], Penicillins, Sulfa antibiotics, and Zostavax [zoster vaccine live]    Review of Systems  All other systems reviewed and are negative.   Updated Vital Signs BP (!) 155/84 (BP Location: Left Arm)   Pulse 65   Temp 97.9 F (36.6 C) (Oral)   Resp 16   Ht 5' 2.5 (1.588 m)   Wt 81.6 kg   LMP 10/17/2010 (Approximate)   SpO2 99%   BMI 32.40 kg/m   Physical Exam Vitals and nursing note reviewed.  Constitutional:      General: She is not in acute distress.    Appearance: She is well-developed.  HENT:     Head: Normocephalic.     Comments: Swelling for his left-sided swelling in occipital region.  No obvious laceration or abrasion.   Eyes:     Conjunctiva/sclera: Conjunctivae normal.  Cardiovascular:  Rate and Rhythm: Normal rate and regular rhythm.     Heart sounds: No murmur heard. Pulmonary:     Effort: Pulmonary effort is normal. No respiratory distress.     Breath sounds: Normal breath sounds. No wheezing or rales.  Abdominal:     Palpations: Abdomen is soft.     Tenderness: There is no abdominal tenderness.  Musculoskeletal:        General: No swelling.     Cervical back: Neck supple.     Comments: No midline tenderness cervical, thoracic and lumbar spine without step-off deformity.  No chest wall tenderness.  Patient with ecchymosis appreciated on the medial aspect of patient's right upper arm without obvious bony tenderness.   Superficial abrasion appreciated left anterior knee.  Full range of motion bilateral upper lower extremities.  No other reproducible tenderness of bilateral upper or lower extremities.  Skin:    General: Skin is warm and dry.     Capillary Refill: Capillary refill takes less than 2 seconds.  Neurological:     Mental Status: She is alert.     Comments: Alert and oriented to self, place, time and event.   Speech is fluent, clear without dysarthria or dysphasia.   Strength 5/5 in upper/lower extremities   Sensation intact in upper/lower extremities   Normal gait.  CN I not tested  CN II not tested CN III, IV, VI PERRLA and EOMs intact bilaterally  CN V Intact sensation to sharp and light touch to the face  CN VII facial movements symmetric  CN VIII not tested  CN IX, X no uvula deviation, symmetric rise of soft palate  CN XI 5/5 SCM and trapezius strength bilaterally  CN XII Midline tongue protrusion, symmetric L/R movements     Psychiatric:        Mood and Affect: Mood normal.     (all labs ordered are listed, but only abnormal results are displayed) Labs Reviewed - No data to display  EKG: None  Radiology: CT Head Wo Contrast Result Date: 07/27/2024 CLINICAL DATA:  Head trauma, minor (Age >= 65y); Neck trauma (Age >= 65y) Triage note: Fall yesterday morning. Hematoma to left side of her head, bruising posterior right arm. Feels out of sorts. Takes ASA 81 mg daily. Negative LOC. EXAM: CT HEAD WITHOUT CONTRAST CT CERVICAL SPINE WITHOUT CONTRAST TECHNIQUE: Multidetector CT imaging of the head and cervical spine was performed following the standard protocol without intravenous contrast. Multiplanar CT image reconstructions of the cervical spine were also generated. RADIATION DOSE REDUCTION: This exam was performed according to the departmental dose-optimization program which includes automated exposure control, adjustment of the mA and/or kV according to patient size and/or use of  iterative reconstruction technique. COMPARISON:  None Available. FINDINGS: CT HEAD FINDINGS Brain: No evidence of large-territorial acute infarction. No parenchymal hemorrhage. No mass lesion. No extra-axial collection. No mass effect or midline shift. No hydrocephalus. Basilar cisterns are patent. Vascular: No hyperdense vessel. Skull: No acute fracture or focal lesion. Sinuses/Orbits: Paranasal sinuses and mastoid air cells are clear. The orbits are unremarkable. Other: None. CT CERVICAL SPINE FINDINGS Alignment: Grade 1 anterolisthesis of C4 on C5. Skull base and vertebrae: Multilevel moderate degenerative change of the spine. 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 No severe osseous central canal stenosis. No acute fracture. No aggressive appearing focal osseous lesion or focal pathologic process. Soft tissues and spinal canal: No prevertebral fluid or swelling. No visible canal hematoma. Upper chest: Unremarkable. Other: None.  IMPRESSION: 1. No acute intracranial abnormality. 2. No acute displaced fracture or traumatic listhesis of the cervical spine. 3. Grade 1 anterolisthesis of C4 on C5. Multilevel moderate degenerative change of the spine. 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 Electronically Signed   By: Morgane  Naveau M.D.   On: 07/27/2024 14:06   CT Cervical Spine Wo Contrast Result Date: 07/27/2024 CLINICAL DATA:  Head trauma, minor (Age >= 65y); Neck trauma (Age >= 65y) Triage note: Fall yesterday morning. Hematoma to left side of her head, bruising posterior right arm. Feels out of sorts. Takes ASA 81 mg daily. Negative LOC. EXAM: CT HEAD WITHOUT CONTRAST CT CERVICAL SPINE WITHOUT CONTRAST TECHNIQUE: Multidetector CT imaging of the head and cervical spine was performed following the standard protocol without intravenous contrast. Multiplanar CT image reconstructions of the cervical spine were also generated. RADIATION DOSE REDUCTION:  This exam was performed according to the departmental dose-optimization program which includes automated exposure control, adjustment of the mA and/or kV according to patient size and/or use of iterative reconstruction technique. COMPARISON:  None Available. FINDINGS: CT HEAD FINDINGS Brain: No evidence of large-territorial acute infarction. No parenchymal hemorrhage. No mass lesion. No extra-axial collection. No mass effect or midline shift. No hydrocephalus. Basilar cisterns are patent. Vascular: No hyperdense vessel. Skull: No acute fracture or focal lesion. Sinuses/Orbits: Paranasal sinuses and mastoid air cells are clear. The orbits are unremarkable. Other: None. CT CERVICAL SPINE FINDINGS Alignment: Grade 1 anterolisthesis of C4 on C5. Skull base and vertebrae: Multilevel moderate degenerative change of the spine. 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 No severe osseous central canal stenosis. No acute fracture. No aggressive appearing focal osseous lesion or focal pathologic process. Soft tissues and spinal canal: No prevertebral fluid or swelling. No visible canal hematoma. Upper chest: Unremarkable. Other: None. IMPRESSION: 1. No acute intracranial abnormality. 2. No acute displaced fracture or traumatic listhesis of the cervical spine. 3. Grade 1 anterolisthesis of C4 on C5. Multilevel moderate degenerative change of the spine. 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 Electronically Signed   By: Morgane  Naveau M.D.   On: 07/27/2024 14:06     Procedures   Medications Ordered in the ED - No data to display                                  Medical Decision Making Amount and/or Complexity of Data Reviewed Radiology: ordered.   This patient presents to the ED for concern of fall, this involves an extensive number of treatment options, and is a complaint that carries with it a high risk of complications and morbidity.  The  differential diagnosis includes fracture, strain/pain, dislocation, ligament/tendon injury, vessel compromise, CVA, other   Co morbidities that complicate the patient evaluation  See HPI   Additional history obtained:  Additional history obtained from EMR External records from outside source obtained and reviewed including hospital records   Lab Tests:  N/a   Imaging Studies ordered:  I ordered imaging studies including CT head/cervical spine I independently visualized and interpreted imaging which showed no acute intracranial abnormality.  No acute displaced fracture or traumatic listhesis of cervical spine.  Grade 1 anterolisthesis C4-C5.  Degenerative disc changes.  Severe osseous I agree with the radiologist interpretation   Cardiac Monitoring: / EKG:  N/a   Consultations Obtained:  N/a   Problem List / ED Course /  Critical interventions / Medication management  Fall Reevaluation of the patient showed that the patient stayed the same I have reviewed the patients home medicines and have made adjustments as needed   Social Determinants of Health:  Denies tobacco, illicit drug use.   Test / Admission - Considered:  Fall Vitals signs significant for hypertension blood pressure 155/84. Otherwise within normal range and stable throughout visit. Imaging studies significant for: see above 73 year old female presents emergency department complaints of fall.  Fall occurred yesterday morning.  Was walking in her house when she tripped on a Cherrelle leg and fell on the ground.  States that she did hit her head on a nearby object as well as the back of her right arm and the front of her left knee.  Takes the daily aspirin but without blood thinner otherwise.  Denies any LOC, visual streams, gait abnormality, slurred speech, facial droop or weakness/sensory deficits in upper extremities.  Denies any chest pain, abdominal pain, nausea, vomiting.  Has been ambulating at  baseline independently. On exam, superficial soft tissue swelling left occipital region of head.  Nonfocal neurologic exam.  Patient with ecchymosis right upper extremity as well as superficial abrasion left anterior knee otherwise, no appreciable traumatic injury or appreciable reproducible tenderness on exam.  CT imaging of head and cervical spine negative for any acute abnormality from the fall.  Patient reassured by findings.  Will recommend symptomatic therapy and follow-up with PCP in the outpatient setting.  Treatment plan discussed with patient and treatment understanding was agreeable to said plan.  Patient well-appearing, afebrile in no acute distress. Worrisome signs and symptoms were discussed with the patient, and the patient acknowledged understanding to return to the ED if noticed. Patient was stable upon discharge.       Final diagnoses:  None    ED Discharge Orders     None          Silver Wonda LABOR, [place] 07/27/24 1451    Dasie Faden, MD 07/28/24 [phone]

## 2024-08-10 ENCOUNTER — Encounter: Payer: Self-pay | Admitting: Cardiology

## 2024-08-10 ENCOUNTER — Ambulatory Visit: Attending: Cardiology | Admitting: Cardiology

## 2024-08-10 VITALS — BP 112/62 | HR 62 | Ht 62.5 in | Wt 183.0 lb

## 2024-08-10 DIAGNOSIS — I10 Essential (primary) hypertension: Secondary | ICD-10-CM | POA: Diagnosis not present

## 2024-08-10 DIAGNOSIS — R002 Palpitations: Secondary | ICD-10-CM | POA: Insufficient documentation

## 2024-08-10 DIAGNOSIS — I700 Atherosclerosis of aorta: Secondary | ICD-10-CM | POA: Diagnosis not present

## 2024-08-10 DIAGNOSIS — I2510 Atherosclerotic heart disease of native coronary artery without angina pectoris: Secondary | ICD-10-CM | POA: Insufficient documentation

## 2024-08-10 DIAGNOSIS — E7800 Pure hypercholesterolemia, unspecified: Secondary | ICD-10-CM | POA: Diagnosis not present

## 2024-08-10 NOTE — Patient Instructions (Signed)
 Medication Instructions:  Continue all medications *If you need a refill on your cardiac medications before your next appointment, please call your pharmacy*  Lab Work: None ordered  Testing/Procedures: None ordered  Follow-Up: At [facility], you and your health needs are our priority.  As part of our continuing mission to provide you with exceptional heart care, our providers are all part of one team.  This team includes your primary Cardiologist (physician) and Advanced Practice Providers or APPs (Physician Assistants and Nurse Practitioners) who all work together to provide you with the care you need, when you need it.  Your next appointment:  1 year    Call in April to schedule August appointment     Provider:  Dr.Jordan   We recommend signing up for the patient portal called MyChart.  Sign up information is provided on this After Visit Summary.  MyChart is used to connect with patients for Virtual Visits (Telemedicine).  Patients are able to view lab/test results, encounter notes, upcoming appointments, etc.  Non-urgent messages can be sent to your provider as well.   To learn more about what you can do with MyChart, go to [email or web address].

## 2024-08-10 NOTE — Progress Notes (Signed)
 Diane Fox Null Date of Birth: [birth year] Medical Record #[id number]  History of Present Illness: Seen today for followup hypercholesterolemia and coronary calcification. She has a history of hypercholesterolemia and is on Crestor  therapy.  She had a normal stress Echo in April 2015. 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   In 2019 she complained of DOE. She has a chronic cough with extensive pulmonary evaluation before. Stress Echo in June 2019 was normal. Prior CT chest in 2016 was normal except for some coronary calcification. She had coronary CTA in Dec 2022 showing a calcium  score of 64 and mild nonobstructive CAD. In April 2024 she complained of palpitations. Event monitor showed some short bursts of PAT.   On follow up today she is doing very well. She is working out at [employer] regularly. She walks daily. No chest pain. No palpitations.  Current Outpatient Medications on File Prior to Visit  Medication Sig Dispense Refill   aspirin EC 81 MG tablet Take 81 mg by mouth daily.     Calcium  Carbonate (CALCIUM  600 PO) Take by mouth.     chlorpheniramine (CHLOR-TRIMETON) 4 MG tablet Take 4 mg by mouth daily as needed for allergies.      Cholecalciferol 125 MCG (5000 UT) capsule Take 5,000 Units by mouth daily.     Coenzyme Q10 200 MG capsule Take 200 mg by mouth daily.     fexofenadine (ALLEGRA) 180 MG tablet Take 180 mg by mouth daily.     Krill Oil Omega-3 500 MG CAPS Take 1 capsule by mouth daily at 6 (six) AM.     losartan (COZAAR) 25 MG tablet      Melatonin 1 MG CAPS daily as needed.     Multiple Vitamins-Minerals (CENTRUM SILVER PO) Take by mouth.     omeprazole (PRILOSEC) 20 MG capsule Take 20 mg by mouth daily.     rosuvastatin  (CRESTOR ) 10 MG tablet TAKE 1 TABLET BY MOUTH DAILY 90 tablet 3   triamcinolone  (NASACORT ) 55 MCG/ACT AERO nasal inhaler Place 2 sprays into the nose daily.     triamterene -hydrochlorothiazide  (MAXZIDE ) 75-50 MG per tablet Take 1 tablet by mouth  every morning.     vitamin C (ASCORBIC ACID) 500 MG tablet Take 500 mg by mouth daily.     vitamin E 400 UNIT capsule Take 400 Units by mouth daily.     cholecalciferol (VITAMIN D ) 1000 units tablet Take 1,000 Units by mouth daily.     Coenzyme Q10 (CO Q 10 PO) Take 200 mg by mouth daily.     No current facility-administered medications on file prior to visit.    Allergies  Allergen Reactions   Clarithromycin Hives and Dermatitis   Nsaids     Other Reaction(s): low GFR   Penicillin V Potassium Dermatitis   Penicillins Hives    Has patient had a PCN reaction causing immediate rash, facial/tongue/throat swelling, SOB or lightheadedness with hypotension: No Has patient had a PCN reaction causing severe rash involving mucus membranes or skin necrosis: No Has patient had a PCN reaction that required hospitalization: No Has patient had a PCN reaction occurring within the last 10 years: Yes If all of the above answers are NO, then may proceed with Cephalosporin use.    Sulfa Antibiotics Hives   Sulfamethoxazole Dermatitis   Zostavax [Zoster Vaccine Live] Hives    Past Medical History:  Diagnosis Date   Arthritis    bilateral knees and hands   Asthma  allergy related, no attacks   Atypical chest pain 02/2014   april stress test=normal   Atypical lobular hyperplasia of breast 09/2002   right   Chronic kidney disease    GFR 58, mild   Edema    maxide prn   Environmental allergies    GERD (gastroesophageal reflux disease)    H/O measles    H/O mumps    Hypercholesterolemia    PONV (postoperative nausea and vomiting)     Past Surgical History:  Procedure Laterality Date   ABDOMINAL HYSTERECTOMY     BREAST BIOPSY Right 09/2002   focal atypical lobular hyperplasia   BREAST SURGERY Right 09/2002   focal atypia hyperplasia   DILATION AND CURETTAGE OF UTERUS     x2   FOOT SURGERY Right    Bunion   HYSTEROSCOPY WITH D & C  07/2007   polyp removed-benign   LIPOMA  EXCISION Right 01/15/2019   Procedure: EXCISION RIGHT BACK LIPOMA;  Surgeon: Lowery Estefana RAMAN, DO;  Location: [facility];  Service: Plastics;  Laterality: Right;   PATELLA-FEMORAL ARTHROPLASTY Left 11/23/2013   Procedure: LEFT KNEE PATELLA-FEMORAL ARTHROPLASTY;  Surgeon: Dempsey LULLA Moan, MD;  Location: [facility];  Service: Orthopedics;  Laterality: Left;   RIGHT OOPHORECTOMY Right 10/1990   tubal ligation with complications   TOTAL KNEE ARTHROPLASTY Left 07/05/2014   Procedure: LEFT TOTAL KNEE ARTHROPLASTY;  Surgeon: Dempsey Moan LULLA, MD;  Location: [facility];  Service: Orthopedics;  Laterality: Left;   TOTAL KNEE ARTHROPLASTY Right 11/11/2017   Procedure: RIGHT TOTAL KNEE ARTHROPLASTY;  Surgeon: Moan Dempsey, MD;  Location: [facility];  Service: Orthopedics;  Laterality: Right;  with abductor block   TUBAL LIGATION  1991   WISDOM TOOTH EXTRACTION  early 20's    Social History   Tobacco Use  Smoking Status Never  Smokeless Tobacco Never    Social History   Substance and Sexual Activity  Alcohol Use Not Currently   Comment: socially    Family History  Problem Relation Age of Onset   Heart disease Mother    Hypertension Mother    Heart disease Father    Heart attack Father    Hypertension Father    Breast cancer Sister 61   Cancer Maternal Grandmother        lymphoma   Cancer Maternal Grandfather        prostate cancer   Asthma Brother     Review of Systems: As noted in history of present illness.   All other systems were reviewed and are negative.  Physical Exam: BP 112/62 (BP Location: Left Arm, Patient Position: Sitting, Cuff Size: Normal)   Pulse 62   Ht 5' 2.5 (1.588 m)   Wt 183 lb (83 kg)   LMP 10/17/2010 (Approximate)   SpO2 95%   BMI 32.94 kg/m  GENERAL:  Well appearing WF in NAD HEENT:  PERRL, EOMI, sclera are clear. Oropharynx is clear. NECK:  No jugular venous distention, carotid upstroke brisk and symmetric, no bruits, no thyromegaly or  adenopathy LUNGS:  Clear to auscultation bilaterally CHEST:  Unremarkable HEART:  RRR,  PMI not displaced or sustained,S1 and S2 within normal limits, no S3, no S4: no clicks, no rubs, no murmurs ABD:  Soft, nontender. BS +, no masses or bruits. No hepatomegaly, no splenomegaly EXT:  2 + pulses throughout, no edema, no cyanosis no clubbing SKIN:  Warm and dry.  No rashes NEURO:  Alert and oriented x 3. Cranial nerves II through  XII intact. PSYCH:  Cognitively intact   LABORATORY DATA:  Lab Results  Component Value Date   WBC 11.6 (H) 11/14/2017   HGB 12.2 11/14/2017   HCT 36.6 11/14/2017   PLT 320 11/14/2017   GLUCOSE 92 12/04/2021   CHOL 158 09/02/2018   TRIG 139 09/02/2018   HDL 59 09/02/2018   LDLCALC 71 09/02/2018   ALT 28 09/02/2018   AST 19 09/02/2018   NA 142 12/04/2021   K 4.2 12/04/2021   CL 100 12/04/2021   CREATININE 0.83 12/04/2021   BUN 17 12/04/2021   CO2 26 12/04/2021   INR 0.94 11/04/2017   Labs dated 05/01/17 reviewed: Normal  Chemistry panel and TSH. Cholesterol- 176, trig- 129, HDL- 61, LDL 89.  Dated 05/14/18: cholesterol 196, triglycerides 158, HDL 65, LDL 100. Chemistries and CBC normal. Dated 06/17/19: normal CBC and LFTs. Normal TSH Dated 08/27/19: cholesterol 165, triglycerides 153, HDL 60, LDL 75. Normal BMET Dated 03/30/20: cholesterol 161, triglycerides 168, HDL 64, LDL 69. CMET normal Dated 10/04/21: cholesterol 176, triglycerides 172, HDL 62, LDL 85. A1c 5.9%. CMET normal. CBC normal Dated 10/11/22: A1c 5.9%.  Dated 04/11/22: cholesterol 156, triglycerides 124, HDL 66, LDL 67. CBC, CMET and TSH normal.  Dated 08/21/23: cholesterol 182, triglycerides 173, HDL 67, LDL 86  EKG Interpretation Date/Time:  Monday August 10 2024 15:00:22 EDT Ventricular Rate:  62 PR Interval:  154 QRS Duration:  72 QT Interval:  398 QTC Calculation: 403 R Axis:   -10  Text Interpretation: Normal sinus rhythm Normal ECG When compared with ECG of 25-Jun-2023 15:00,  Nonspecific T wave abnormality is no longer seen Confirmed by [place], Baani Bober [phone]) on 08/10/2024 3:03:39 PM    Stress Echo 05/28/18: Study Conclusions   - Stress ECG conclusions: There were no stress arrhythmias or   conduction abnormalities. The stress ECG was negative for   ischemia. - Staged echo: There was no echocardiographic evidence for   stress-induced ischemia.   Impressions:   - Normal hyperdynamic response to exercise. Normal stress   echocardiogram. Good exrcise capacity. Normal BP response to   exercise.  Coronary CTA 12/12/21:  CLINICAL DATA:  57F with coronary calcification, hyperlipidemia, and chest pain.   EXAM: Cardiac/Coronary  CT   TECHNIQUE: The patient was scanned on a [employer].   FINDINGS: A 120 kV prospective scan was triggered in the descending thoracic aorta at 111 HU's. Axial non-contrast 3 mm slices were carried out through the heart. The data set was analyzed on a dedicated work station and scored using the Agatson method. Gantry rotation speed was 250 msecs and collimation was .6 mm. No beta blockade and 0.8 mg of sl NTG was given. The 3D data set was reconstructed in 5% intervals of the 67-82 % of the R-R cycle. Diastolic phases were analyzed on a dedicated work station using MPR, MIP and VRT modes. The patient received 80 cc of contrast.   Aorta: Normal size. Mild calcification of the aortic root and descending aorta. No dissection.   Aortic Valve:  Trileaflet.  No calcifications.   Coronary Arteries:  Normal coronary origin.  Right dominance.   RCA is a large dominant artery that gives rise to PDA and PLVB. There is no plaque.   Left main is a large artery that gives rise to LAD and LCX arteries. There is minimal (<25%) calcified plaque.   LAD is a large vessel that has minimal (<25%) calcified plaque proximally. D1 and D2 have no plaque.  LCX is a non-dominant artery that gives rise to two OM branches. There is no  plaque.   Coronary Calcium  Score:   Left main: 0   Left anterior descending artery: 64.4   Left circumflex artery: 0   Right coronary artery: 0   Total: 64.4   Percentile: 64th   Other findings:   Normal pulmonary vein drainage into the left atrium.   Normal let atrial appendage without a thrombus.   Normal size of the pulmonary artery.   IMPRESSION: 1. Coronary calcium  score of 64.4. This was 64th percentile for age-, race-, and sex-matched controls.   2. Normal coronary origin with right dominance.   3.  Minimal (<25%) LM and LAD calcified plaque.  CAD-RADS 1.   4.  Minimal calcification of the aorta.   Annabella Scarce, MD     Electronically Signed   By: Annabella Scarce M.D.   On: 12/14/2021 14:32    Addended by Scarce Annabella, MD on 12/14/2021  2:34 PM     Event monitor 04/29/23: Study Highlights      Normal sinus rhythm   Rate PACs   10 short runs of SVT/PAT longest lasting 19 beats   Symptoms appear to correlate with PACs and SVT     Patch Wear Time:  14 days and 19 hours (2024-04-07T13:19:37-0400 to 2024-05-02T06:51:07-399)   Monitor 1 Patient had a min HR of 51 bpm, max HR of 131 bpm, and avg HR of 74 bpm. Predominant underlying rhythm was Sinus Rhythm. Isolated SVEs were rare (<1.0%), and no SVE Couplets or SVE Triplets were present. Isolated VEs were rare (<1.0%), and no VE Couplets  or VE Triplets were present.    Monitor 2 Patient had a min HR of 48 bpm, max HR of 184 bpm, and avg HR of 77 bpm. Predominant underlying rhythm was Sinus Rhythm. 10 Supraventricular Tachycardia runs occurred, the run with the fastest interval lasting 7 beats with a max rate of 184 bpm, the  longest lasting 19 beats with an avg rate of 120 bpm. Supraventricular Tachycardia was detected within +/- 45 seconds of symptomatic patient event(s). Isolated SVEs were rare (<1.0%), SVE Couplets were rare (<1.0%), and SVE Triplets were rare (<1.0%).  Isolated VEs were rare  (<1.0%, 114), VE Couplets were rare (<1.0%, 3), and VE Triplets were rare (<1.0%, 2).         Assessment / Plan:  1. Hypercholesterolemia. On Crestor  10 mg daily. Due for repeat labs with PCP soon. Would like to see LDL at least < 70. If not at goal would increase Crestor .    Encourage continued  lifestyle modifications.  2. Atypical chest pain of recent onset.   Negative pulmonary evaluation in the past. Normal stress Echo 2019. Coronary CTA with mild nonobstructive CAD.   3. HTN. Well controlled on current medication.

## 2024-08-16 DIAGNOSIS — I10 Essential (primary) hypertension: Secondary | ICD-10-CM | POA: Diagnosis not present

## 2024-08-16 DIAGNOSIS — I700 Atherosclerosis of aorta: Secondary | ICD-10-CM | POA: Diagnosis not present

## 2024-08-16 DIAGNOSIS — E782 Mixed hyperlipidemia: Secondary | ICD-10-CM | POA: Diagnosis not present

## 2024-08-24 ENCOUNTER — Other Ambulatory Visit: Payer: Self-pay | Admitting: Family Medicine

## 2024-08-24 DIAGNOSIS — Z1231 Encounter for screening mammogram for malignant neoplasm of breast: Secondary | ICD-10-CM

## 2024-08-25 ENCOUNTER — Other Ambulatory Visit: Payer: Self-pay | Admitting: Cardiology

## 2024-09-09 DIAGNOSIS — Z0000 Encounter for general adult medical examination without abnormal findings: Secondary | ICD-10-CM | POA: Diagnosis not present

## 2024-09-09 DIAGNOSIS — R42 Dizziness and giddiness: Secondary | ICD-10-CM | POA: Diagnosis not present

## 2024-09-09 DIAGNOSIS — R2681 Unsteadiness on feet: Secondary | ICD-10-CM | POA: Diagnosis not present

## 2024-09-09 DIAGNOSIS — J302 Other seasonal allergic rhinitis: Secondary | ICD-10-CM | POA: Diagnosis not present

## 2024-09-09 DIAGNOSIS — I10 Essential (primary) hypertension: Secondary | ICD-10-CM | POA: Diagnosis not present

## 2024-09-09 DIAGNOSIS — I700 Atherosclerosis of aorta: Secondary | ICD-10-CM | POA: Diagnosis not present

## 2024-09-09 DIAGNOSIS — Z1331 Encounter for screening for depression: Secondary | ICD-10-CM | POA: Diagnosis not present

## 2024-09-09 DIAGNOSIS — R7303 Prediabetes: Secondary | ICD-10-CM | POA: Diagnosis not present

## 2024-09-09 DIAGNOSIS — E782 Mixed hyperlipidemia: Secondary | ICD-10-CM | POA: Diagnosis not present

## 2024-09-10 ENCOUNTER — Other Ambulatory Visit: Payer: Self-pay | Admitting: Family Medicine

## 2024-09-10 ENCOUNTER — Encounter: Payer: Self-pay | Admitting: Neurology

## 2024-09-10 DIAGNOSIS — R42 Dizziness and giddiness: Secondary | ICD-10-CM

## 2024-09-14 ENCOUNTER — Inpatient Hospital Stay
Admission: RE | Admit: 2024-09-14 | Discharge: 2024-09-14 | Disposition: A | Source: Ambulatory Visit | Attending: Family Medicine

## 2024-09-14 DIAGNOSIS — R42 Dizziness and giddiness: Secondary | ICD-10-CM | POA: Diagnosis not present

## 2024-09-14 DIAGNOSIS — I639 Cerebral infarction, unspecified: Secondary | ICD-10-CM | POA: Diagnosis not present

## 2024-09-14 MED ORDER — GADOPICLENOL 0.5 MMOL/ML IV SOLN
9.0000 mL | Freq: Once | INTRAVENOUS | Status: AC | PRN
  Administered 2024-09-14: 9 mL via INTRAVENOUS

## 2024-09-15 DIAGNOSIS — I10 Essential (primary) hypertension: Secondary | ICD-10-CM | POA: Diagnosis not present

## 2024-09-15 DIAGNOSIS — I700 Atherosclerosis of aorta: Secondary | ICD-10-CM | POA: Diagnosis not present

## 2024-09-15 DIAGNOSIS — E782 Mixed hyperlipidemia: Secondary | ICD-10-CM | POA: Diagnosis not present

## 2024-09-21 ENCOUNTER — Other Ambulatory Visit

## 2024-09-22 DIAGNOSIS — Z23 Encounter for immunization: Secondary | ICD-10-CM | POA: Diagnosis not present

## 2024-09-28 ENCOUNTER — Encounter: Payer: Self-pay | Admitting: Cardiology

## 2024-09-29 DIAGNOSIS — H903 Sensorineural hearing loss, bilateral: Secondary | ICD-10-CM | POA: Diagnosis not present

## 2024-09-29 DIAGNOSIS — H6121 Impacted cerumen, right ear: Secondary | ICD-10-CM | POA: Diagnosis not present

## 2024-09-29 DIAGNOSIS — R42 Dizziness and giddiness: Secondary | ICD-10-CM | POA: Diagnosis not present

## 2024-10-08 ENCOUNTER — Ambulatory Visit
Admission: RE | Admit: 2024-10-08 | Discharge: 2024-10-08 | Disposition: A | Source: Ambulatory Visit | Attending: Family Medicine

## 2024-10-08 DIAGNOSIS — Z1231 Encounter for screening mammogram for malignant neoplasm of breast: Secondary | ICD-10-CM

## 2024-10-16 DIAGNOSIS — E782 Mixed hyperlipidemia: Secondary | ICD-10-CM | POA: Diagnosis not present

## 2024-10-16 DIAGNOSIS — I700 Atherosclerosis of aorta: Secondary | ICD-10-CM | POA: Diagnosis not present

## 2024-10-16 DIAGNOSIS — I10 Essential (primary) hypertension: Secondary | ICD-10-CM | POA: Diagnosis not present

## 2024-11-02 ENCOUNTER — Other Ambulatory Visit

## 2024-11-04 ENCOUNTER — Ambulatory Visit (HOSPITAL_BASED_OUTPATIENT_CLINIC_OR_DEPARTMENT_OTHER)
Admission: RE | Admit: 2024-11-04 | Discharge: 2024-11-04 | Disposition: A | Discharge status: Home / Self Care | Source: Ambulatory Visit | Attending: Family Medicine | Admitting: Family Medicine

## 2024-11-04 DIAGNOSIS — Z780 Asymptomatic menopausal state: Secondary | ICD-10-CM | POA: Diagnosis not present

## 2024-11-04 DIAGNOSIS — Z13820 Encounter for screening for osteoporosis: Secondary | ICD-10-CM | POA: Diagnosis not present

## 2024-11-04 DIAGNOSIS — M85851 Other specified disorders of bone density and structure, right thigh: Secondary | ICD-10-CM | POA: Insufficient documentation

## 2024-11-04 DIAGNOSIS — M8580 Other specified disorders of bone density and structure, unspecified site: Secondary | ICD-10-CM

## 2024-11-10 ENCOUNTER — Other Ambulatory Visit (HOSPITAL_COMMUNITY): Payer: Self-pay

## 2024-11-10 MED ORDER — CLINDAMYCIN HCL 150 MG PO CAPS
150.0000 mg | ORAL_CAPSULE | Freq: Four times a day (QID) | ORAL | 0 refills | Status: AC
  Filled 2024-11-10: qty 28, 7d supply, fill #0

## 2024-11-17 DIAGNOSIS — H04123 Dry eye syndrome of bilateral lacrimal glands: Secondary | ICD-10-CM | POA: Diagnosis not present

## 2024-11-17 DIAGNOSIS — H2513 Age-related nuclear cataract, bilateral: Secondary | ICD-10-CM | POA: Diagnosis not present

## 2024-11-24 ENCOUNTER — Encounter: Payer: Self-pay | Admitting: Neurology

## 2024-11-24 ENCOUNTER — Ambulatory Visit (INDEPENDENT_AMBULATORY_CARE_PROVIDER_SITE_OTHER): Admitting: Neurology

## 2024-11-24 ENCOUNTER — Other Ambulatory Visit

## 2024-11-24 VITALS — BP 132/78 | HR 71 | Ht 62.5 in | Wt 187.0 lb

## 2024-11-24 DIAGNOSIS — H8110 Benign paroxysmal vertigo, unspecified ear: Secondary | ICD-10-CM

## 2024-11-24 DIAGNOSIS — R2681 Unsteadiness on feet: Secondary | ICD-10-CM

## 2024-11-24 LAB — B12 AND FOLATE PANEL
Folate: >24 ng/mL
Vitamin B-12: 617 pg/mL (ref 200–1100)

## 2024-11-24 NOTE — Progress Notes (Signed)
 [facility] Clinic Note - Initial Visit   Date: 11/24/2024   Diane Fox MRN: [id number] DOB: August 12, 1951   Dear Dr. Rolinda:  Thank you for your kind referral of Diane Fox for consultation of gait unsteadiness and dizziness. Although her history is well known to you, please allow us  to reiterate it for the purpose of our medical record. The patient was accompanied to the clinic by self.    Diane Fox is a 73 y.o. right-handed female with hypertension, hyperlipidemia, and GERD presenting for evaluation of dizziness and unsteadiness.   IMPRESSION/PLAN: Assessment & Plan Benign paroxysmal positional vertigo, resolved.   She does not have ongoing dizziness but reports having unsteadiness.  MRI brain was personally viewed and does not show any intracranial pathology.   - Vestibular therapy evaluation is pending - Keep meclizine for future episodes.  Gait unsteadiness.  No evidence of neuropathy or neurodegenerative condition.  She has degenerative changes in the cervical spine, which may contribute.   - Check vitamin B12 and folate levels. - Consider neck PT going forward and/or MRI cervical spine   Return to clinic as needed  ------------------------------------------------------------- History of present illness:  Discussed the use of AI scribe software for clinical note transcription with the patient, who gave verbal consent to proceed.  History of Present Illness She has experienced imbalance since August, leading to a fall at home where she hit her head. At that time, she did not have nausea or dizziness despite being on aspirin.  In September, she had a severe episode of vertigo characterized by room spinning, nausea, and vomiting, which incapacitated her for a day. Benadryl  provided relief over the course of a week. Since then, she has not fallen again but continues to experience lightheadedness and unsteadiness, particularly when lying  down at night or getting up in the morning.   An ENT consultation on October 14th recommended vestibular testing, but it was not performed due to degenerative changes in her neck. She has been trying to arrange for the study at [facility], which has been delayed due to issues with the order being sent. She has not started vestibular therapy yet.  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 Occasional neck pain is present, and she reports occasional numbness or tingling in the arms.   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  She denies smoking and reports occasional alcohol use. There is no history of diabetes or cancer.  Out-side paper records, electronic medical record, and images have been reviewed where available and summarized as:  MRI brain wo contrast 09/14/2024: 1. Normal aging brain. 2. Incidental left sphenoid sinus mucosal thickening.  CT cervical spine 07/27/2024: 1. No acute intracranial abnormality. 2. No acute displaced fracture or traumatic listhesis of the cervical spine. 3. Grade 1 anterolisthesis of C4 on C5. Multilevel moderate degenerative change of the spine. 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  Past Medical History:  Diagnosis Date   Arthritis    bilateral knees and hands   Asthma    allergy related, no attacks   Atypical chest pain 02/2014   april stress test=normal   Atypical lobular hyperplasia of breast 09/2002   right   Chronic kidney disease    GFR 58, mild   Edema  maxide prn   Environmental allergies    GERD (gastroesophageal reflux disease)    H/O measles    H/O mumps    Hypercholesterolemia    PONV (postoperative nausea and vomiting)     Past Surgical History:  Procedure Laterality Date   ABDOMINAL HYSTERECTOMY     BREAST BIOPSY Right  09/2002   focal atypical lobular hyperplasia   BREAST SURGERY Right 09/2002   focal atypia hyperplasia   DILATION AND CURETTAGE OF UTERUS     x2   FOOT SURGERY Right    Bunion   HYSTEROSCOPY WITH D & C  07/2007   polyp removed-benign   LIPOMA EXCISION Right 01/15/2019   Procedure: EXCISION RIGHT BACK LIPOMA;  Surgeon: Lowery Estefana RAMAN, DO;  Location: [facility];  Service: Plastics;  Laterality: Right;   PATELLA-FEMORAL ARTHROPLASTY Left 11/23/2013   Procedure: LEFT KNEE PATELLA-FEMORAL ARTHROPLASTY;  Surgeon: Dempsey LULLA Moan, MD;  Location: [facility];  Service: Orthopedics;  Laterality: Left;   RIGHT OOPHORECTOMY Right 10/1990   tubal ligation with complications   TOTAL KNEE ARTHROPLASTY Left 07/05/2014   Procedure: LEFT TOTAL KNEE ARTHROPLASTY;  Surgeon: Dempsey Moan LULLA, MD;  Location: [facility];  Service: Orthopedics;  Laterality: Left;   TOTAL KNEE ARTHROPLASTY Right 11/11/2017   Procedure: RIGHT TOTAL KNEE ARTHROPLASTY;  Surgeon: Moan Dempsey, MD;  Location: [facility];  Service: Orthopedics;  Laterality: Right;  with abductor block   TUBAL LIGATION  1991   WISDOM TOOTH EXTRACTION  early 20's     Medications:  Outpatient Encounter Medications as of 11/24/2024  Medication Sig Note   aspirin EC 81 MG tablet Take 81 mg by mouth daily.    Calcium  Carbonate (CALCIUM  600 PO) Take by mouth.    chlorpheniramine (CHLOR-TRIMETON) 4 MG tablet Take 4 mg by mouth daily as needed for allergies.     Cholecalciferol 125 MCG (5000 UT) capsule Take 5,000 Units by mouth daily.    Coenzyme Q10 200 MG capsule Take 200 mg by mouth daily.    fexofenadine (ALLEGRA) 180 MG tablet Take 180 mg by mouth daily.    Krill Oil Omega-3 500 MG CAPS Take 1 capsule by mouth daily at 6 (six) AM.    losartan (COZAAR) 25 MG tablet     Melatonin 1 MG CAPS daily as needed. 06/12/2023: Occasional use   Multiple Vitamins-Minerals (CENTRUM SILVER PO) Take by mouth.    omeprazole (PRILOSEC) 20 MG capsule Take  20 mg by mouth daily.    rosuvastatin  (CRESTOR ) 10 MG tablet TAKE 1 TABLET BY MOUTH DAILY    triamcinolone  (NASACORT ) 55 MCG/ACT AERO nasal inhaler Place 2 sprays into the nose daily.    triamterene -hydrochlorothiazide  (MAXZIDE ) 75-50 MG per tablet Take 1 tablet by mouth every morning.    vitamin C (ASCORBIC ACID) 500 MG tablet Take 500 mg by mouth daily.    vitamin E 400 UNIT capsule Take 400 Units by mouth daily.    clindamycin  (CLEOCIN ) 150 MG capsule Take 1 capsule (150 mg total) by mouth 4 (four) times daily until finished    No facility-administered encounter medications on file as of 11/24/2024.    Allergies:  Allergies  Allergen Reactions   Clarithromycin Hives and Dermatitis   Nsaids     Other Reaction(s): low GFR   Penicillin V Potassium Dermatitis   Penicillins Hives    Has patient had a PCN reaction causing immediate rash, facial/tongue/throat swelling, SOB or lightheadedness with hypotension: No Has patient had a PCN reaction  causing severe rash involving mucus membranes or skin necrosis: No Has patient had a PCN reaction that required hospitalization: No Has patient had a PCN reaction occurring within the last 10 years: Yes If all of the above answers are NO, then may proceed with Cephalosporin use.    Sulfa Antibiotics Hives   Sulfamethoxazole Dermatitis   Zostavax [Zoster Vaccine Live] Hives    Family History: Family History  Problem Relation Age of Onset   Heart disease Mother    Hypertension Mother    Stroke Mother    Heart disease Father    Heart attack Father    Hypertension Father    Breast cancer Sister 57   Bladder Cancer Sister    Asthma Brother    Other Brother        issues with swelling, pain, possible undiagnosed neurological issues, foot drop   Cancer Maternal Grandmother        lymphoma   Cancer Maternal Grandfather        prostate cancer    Social History: Social History   Tobacco Use   Smoking status: Never   Smokeless tobacco:  Never  Vaping Use   Vaping status: Never Used  Substance Use Topics   Alcohol use: Yes    Comment: Occasional drink   Drug use: No   Social History   Social History Narrative   Are you right handed or left handed? Right Handed    Are you currently employed ? No    What is your current occupation?   Do you live at home alone? No    Who lives with you? Husband    What type of home do you live in: 1 story or 2 story? Lives in a two story home        Vital Signs:  BP 132/78   Pulse 71   Ht 5' 2.5 (1.588 m)   Wt 187 lb (84.8 kg)   LMP 10/17/2010 (Approximate)   SpO2 97%   BMI 33.66 kg/m   Neurological Exam: MENTAL STATUS including orientation to time, place, person, recent and remote memory, attention span and concentration, language, and fund of knowledge is normal.  Speech is not dysarthric.  CRANIAL NERVES: II:  No visual field defects.     III-IV-VI: Pupils equal round and reactive to light.  Normal conjugate, extra-ocular eye movements in all directions of gaze.  Mild end-gaze nystagmus bilaterally.  No ptosis.   V:  Normal facial sensation.    VII:  Normal facial symmetry and movements.   VIII:  Normal hearing and vestibular function.   IX-X:  Normal palatal movement.   XI:  Normal shoulder shrug and head rotation.   XII:  Normal tongue strength and range of motion, no deviation or fasciculation.  MOTOR:  Motor strength is 5/5 throughout.  No atrophy, fasciculations or abnormal movements.  No pronator drift.   MSRs:                                           Right        Left brachioradialis 2+  2+  biceps 2+  2+  triceps 2+  2+  patellar 2+  2+  ankle jerk 2+  2+  Hoffman no  no  plantar response down  down   SENSORY:  Normal and symmetric perception of light touch, pinprick, vibration.  Romberg's  shows very mild sway.   COORDINATION/GAIT: Normal finger-to- nose-finger.  Intact rapid alternating movements bilaterally.  Gait narrow based and stable. Tandem  and stressed gait intact.     Thank you for allowing me to participate in patient's care.  If I can answer any additional questions, I would be pleased to do so.    Sincerely,    Benzion Mesta K. Tobie, DO

## 2024-11-24 NOTE — Patient Instructions (Signed)
 It was lovely seeing you today!  Check labs  If you would like to start neck physiotherapy, please send me a MyChart message.

## 2024-11-26 ENCOUNTER — Ambulatory Visit: Payer: Self-pay | Admitting: Neurology

## 2024-11-26 ENCOUNTER — Other Ambulatory Visit (HOSPITAL_COMMUNITY): Payer: Self-pay

## 2024-12-18 ENCOUNTER — Other Ambulatory Visit (HOSPITAL_COMMUNITY): Payer: Self-pay

## 2024-12-30 ENCOUNTER — Other Ambulatory Visit (HOSPITAL_COMMUNITY): Payer: Self-pay

## 2024-12-30 ENCOUNTER — Other Ambulatory Visit: Payer: Self-pay

## 2024-12-30 MED ORDER — SCOPOLAMINE 1 MG/3DAYS TD PT72
1.0000 | MEDICATED_PATCH | TRANSDERMAL | 0 refills | Status: AC
  Filled 2024-12-30: qty 10, 30d supply, fill #0

## 2024-12-31 NOTE — Therapy (Signed)
 " OUTPATIENT PHYSICAL THERAPY VESTIBULAR EVALUATION     Patient Name: Diane Fox MRN: [id number] DOB:November 19, 1951, 74 y.o., female Today's Date: 01/01/2025  END OF SESSION:  PT End of Session - 01/01/25 1337     Visit Number 1    Number of Visits 4    Date for Recertification  01/31/25    Authorization Type MEDICARE PART A AND B    PT Start Time 1335   pt was not checked in   PT Stop Time 1415    PT Time Calculation (min) 40 min    Activity Tolerance Patient tolerated treatment well   limited by neck pain   Behavior During Therapy [facility] for tasks assessed/performed          Past Medical History:  Diagnosis Date   Arthritis    bilateral knees and hands   Asthma    allergy related, no attacks   Atypical chest pain 02/2014   april stress test=normal   Atypical lobular hyperplasia of breast 09/2002   right   Chronic kidney disease    GFR 58, mild   Edema    maxide prn   Environmental allergies    GERD (gastroesophageal reflux disease)    H/O measles    H/O mumps    Hypercholesterolemia    PONV (postoperative nausea and vomiting)    Past Surgical History:  Procedure Laterality Date   ABDOMINAL HYSTERECTOMY     BREAST BIOPSY Right 09/2002   focal atypical lobular hyperplasia   BREAST SURGERY Right 09/2002   focal atypia hyperplasia   DILATION AND CURETTAGE OF UTERUS     x2   FOOT SURGERY Right    Bunion   HYSTEROSCOPY WITH D & C  07/2007   polyp removed-benign   LIPOMA EXCISION Right 01/15/2019   Procedure: EXCISION RIGHT BACK LIPOMA;  Surgeon: Lowery Estefana RAMAN, DO;  Location: [facility];  Service: Plastics;  Laterality: Right;   PATELLA-FEMORAL ARTHROPLASTY Left 11/23/2013   Procedure: LEFT KNEE PATELLA-FEMORAL ARTHROPLASTY;  Surgeon: Dempsey LULLA Moan, MD;  Location: [facility];  Service: Orthopedics;  Laterality: Left;   RIGHT OOPHORECTOMY Right 10/1990   tubal ligation with complications   TOTAL KNEE ARTHROPLASTY Left 07/05/2014    Procedure: LEFT TOTAL KNEE ARTHROPLASTY;  Surgeon: Dempsey Moan LULLA, MD;  Location: [facility];  Service: Orthopedics;  Laterality: Left;   TOTAL KNEE ARTHROPLASTY Right 11/11/2017   Procedure: RIGHT TOTAL KNEE ARTHROPLASTY;  Surgeon: Moan Dempsey, MD;  Location: [facility];  Service: Orthopedics;  Laterality: Right;  with abductor block   TUBAL LIGATION  1991   WISDOM TOOTH EXTRACTION  early 20's   Patient Active Problem List   Diagnosis Date Noted   Osteopenia 04/13/2023   History of salpingoophorectomy 05/01/2021   Atypical lobular hyperplasia (ALH) of right breast 05/01/2021   Postmenopausal 05/01/2021   Lipoma of back 12/23/2018   Chronic kidney disease 09/26/2018   OA (osteoarthritis) of knee 11/23/2013   Chronic cough 01/23/2013   Hypercholesterolemia 11/05/2011    PCP: Rolinda Millman, MD REFERRING PROVIDER: Rolinda Millman, MD  REFERRING DIAG: R42 (ICD-10-CM) - Dizziness and giddiness  THERAPY DIAG:  Dizziness and giddiness  ONSET DATE: 12/30/2024  Rationale for Evaluation and Treatment: Rehabilitation  SUBJECTIVE:   SUBJECTIVE STATEMENT: Has seen neurology and ENT. Feels like she is almost back to baseline since it has been so long. PCP referred her here to make sure that her symptoms are not coming from her neck. Not having too bad  of imbalance. Dizziness that she has has gotten to a minimum. Now she has it when she gets up in the morning and has to wait a second before getting out of bed. Things are spinning a little bit. Feels like she has more guarded movements, esp when bending down. Reports PCP might be referring pt to a neurosurgeon for her neck, but wanted her to come here to PT first to make sure dizziness was not coming from her neck. Going on a cruise feb 9th.  Pt accompanied by: self  PERTINENT HISTORY: PMH: hypertension, hyperlipidemia, GERD, osteopenia, hx of bilateral knee replacements  Per Dr. Tobie on 11/24/24: She has experienced imbalance since August,  leading to a fall at home where she hit her head. At that time, she did not have nausea or dizziness despite being on aspirin.   In September, she had a severe episode of vertigo characterized by room spinning, nausea, and vomiting, which incapacitated her for a day. Benadryl  provided relief over the course of a week. Since then, she has not fallen again but continues to experience lightheadedness and unsteadiness, particularly when lying down at night or getting up in the morning.   PAIN:  Are you having pain? No  Vitals:   01/01/25 1353  BP: 120/72  Pulse: 62     PRECAUTIONS: Fall   FALLS: Has patient fallen in last 6 months? Yes. Number of falls 1  LIVING ENVIRONMENT: Lives with: lives with their spouse Lives in: House/apartment Stairs: 2 stories, no issues with steps as primary suite on first level  Has following equipment at home: uses bilateral walking sticks   PLOF: Independent  PATIENT GOALS: Wants to see what kind of therapy can help with the dizziness  OBJECTIVE:  Note: Objective measures were completed at Evaluation unless otherwise noted.  DIAGNOSTIC FINDINGS: MRI brain performed on 09/14/24: IMPRESSION:  1. Normal aging brain.  2. Incidental left sphenoid sinus mucosal thickening.    CT head performed on 07/27/24 following a minor fall: IMPRESSION:  1. No acute intracranial abnormality.  2. No acute displaced fracture or traumatic listhesis of the  cervical spine.  3. Grade 1 anterolisthesis of C4 on C5. Multilevel moderate  degenerative change of the spine. 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   COGNITION: Overall cognitive status: Within functional limits for tasks assessed    POSTURE:  rounded shoulders and forward head  Cervical ROM:   More limited rotation going to L, limited in cervical extension     GAIT: Gait pattern: WFL Distance walked: Clinic distances  Assistive device utilized: None Level of  assistance: Complete Independence Comments: No unsteadiness noted ambulating in and out of clinic    VESTIBULAR ASSESSMENT:  GENERAL OBSERVATION: Ambulates in with no AD    SYMPTOM BEHAVIOR:  Subjective history: See above  Non-Vestibular symptoms: neck feels uncomfortable   Type of dizziness: Spinning/Vertigo  Frequency: few times a week   Duration: not long, 15-30 seconds   Aggravating factors: Induced by position change: supine to sit, sometimes turning in bed   Relieving factors: waiting for it to pass   Progression of symptoms: better  OCULOMOTOR EXAM:  Ocular Alignment: normal  Ocular ROM: No Limitations  Spontaneous Nystagmus: absent  Gaze-Induced Nystagmus: absent  Smooth Pursuits: intact  Saccades: intact   VESTIBULAR - OCULAR REFLEX:   Slow VOR: Normal, pt very guarded, a little weird feeling  VOR Cancellation: Normal, feels like she might get dizzy, does not feel stable  Head-Impulse Test: did not assess due to incr guarding with slow VOR   POSITIONAL TESTING: Right Dix-Hallpike: no nystagmus and pt reporting feeling lightheaded coming up  Left Dix-Hallpike: no nystagmus and pt reporting feeling lightheaded coming up  Right Roll Test: no nystagmus and no dizziness  Left Roll Test: no nystagmus and no dizziness  Right Sidelying: no nystagmus and felt very marginally dizzy on her side and neck felt uncomfortable, did feel lightheadedness coming up  Left Sidelying: no nystagmus and felt uncomfortable, felt something uncomfortable when coming back up   When coming up from sidelying, feels like she can't get up right away and get moving  DixHallpike performed modified over 2 pillows due to limited cervical extension   OTHOSTATICS:  Supine 2:06 PM: 137/78, HR: 72 bpm  Sitting 2:08 PM: 135/71                                                                                                                             TREATMENT DATE: 12/22/24  Therapeutic  Activity:  Educated on clinical findings, pt negative for BPPV and orthostatics, but does still have some lightheadedness/off balance feeling when coming up to sit. Provided Wilhelmena Carrel for habituation tasks  Performed [employer] x1 rep each side, pt reporting feeling some lightheadedness/off balance when coming up to sitting   PATIENT EDUCATION: Education details: See above, Wilhelmena Carrel for HEP, POC (doing 1 follow-up appt)  Person educated: Patient Education method: Explanation, Demonstration, Verbal cues, and Handouts Education comprehension: verbalized understanding and returned demonstration  HOME EXERCISE PROGRAM: Access Code: [email or web address] URL: [email or web address] Date: 01/01/2025 Prepared by: Sheffield Senate  Exercises - Brandt-Daroff Vestibular Exercise  - 1-2 x daily - 7 x weekly - 4-5 sets  GOALS: Goals reviewed with patient? Yes  SHORT TERM GOALS: ALL STGS = LTGS  LONG TERM GOALS: Target date: 01/29/2025  Pt will report no dizziness with bed mobility.  Baseline: lightheadedness with sidelying > sit, does not feel stable when coming up  Goal status: INITIAL  2.  Further balance to be assessed as appropriate.  Baseline:  Goal status: INITIAL    ASSESSMENT:  CLINICAL IMPRESSION: Patient is a 74 year old female referred to Neuro OPPT for dizziness.   Pt's PMH is significant for: hypertension, hyperlipidemia, GERD, osteopenia, hx of bilateral knee replacements. Since it has been a while since pt's initial episode, pt does report that she is starting to feel back to baseline. The following deficits were present during the exam: pt reporting some lightheadedness/feeling off balance when coming up from sidelying position. Pt negative for BPPV for no nystagmus and also negative for orthostatics. Pt with limited neck mobility and does remain guarded. Pt reports that PCP will refer her to neurosurgery after vestibular PT to make sure dizziness is not coming  from her neck.Did give pt some Brandt Daroff for habituation with return to upright. Scheduled a follow-up visit to do further testing as needed.  Pt would  benefit from skilled PT to address these impairments and functional limitations to maximize functional mobility independence and decr dizziness.    OBJECTIVE IMPAIRMENTS: decreased mobility, decreased ROM, dizziness, impaired flexibility, and postural dysfunction.   ACTIVITY LIMITATIONS: bending, transfers, and bed mobility  PARTICIPATION LIMITATIONS: N/A  PERSONAL FACTORS: Age, Past/current experiences, Time since onset of injury/illness/exacerbation, and 3+ comorbidities: hypertension, hyperlipidemia, GERD, osteopenia, hx of bilateral knee replacements are also affecting patient's functional outcome.   REHAB POTENTIAL: Good  CLINICAL DECISION MAKING: Evolving/moderate complexity  EVALUATION COMPLEXITY: Moderate   PLAN:  PT FREQUENCY: 1x/week  PT DURATION: 4 weeks - don't anticipate pt using all visits   PLANNED INTERVENTIONS: [id number]- PT Re-evaluation, 97110-Therapeutic exercises, 97530- Therapeutic activity, 97112- Neuromuscular re-education, 97535- Self Care, [id number]- Manual therapy, [phone]- Canalith repositioning, Patient/Family education, Balance training, and Vestibular training  PLAN FOR NEXT SESSION: how were brandt daroff exercises? Further assess balance as needed    Sheffield LOISE Senate, PT, DPT 01/01/2025, 2:28 PM  "

## 2025-01-01 ENCOUNTER — Encounter: Payer: Self-pay | Admitting: Physical Therapy

## 2025-01-01 ENCOUNTER — Ambulatory Visit: Attending: Family Medicine | Admitting: Physical Therapy

## 2025-01-01 VITALS — BP 120/72 | HR 62

## 2025-01-01 DIAGNOSIS — R42 Dizziness and giddiness: Secondary | ICD-10-CM | POA: Insufficient documentation

## 2025-01-12 ENCOUNTER — Ambulatory Visit: Admitting: Physical Therapy

## 2025-01-18 ENCOUNTER — Ambulatory Visit: Admitting: Physical Therapy
